# Patient Record
Sex: Female | Born: 1995 | Race: White | Hispanic: No | Marital: Married
Health system: Midwestern US, Community
[De-identification: ages and names within clinical notes are randomized; demographics above are authoritative.]

## PROBLEM LIST (undated history)

## (undated) DIAGNOSIS — D649 Anemia, unspecified: Secondary | ICD-10-CM

## (undated) DIAGNOSIS — M3500 Sicca syndrome, unspecified: Secondary | ICD-10-CM

## (undated) DIAGNOSIS — G932 Benign intracranial hypertension: Secondary | ICD-10-CM

## (undated) DIAGNOSIS — O24419 Gestational diabetes mellitus in pregnancy, unspecified control: Secondary | ICD-10-CM

## (undated) DIAGNOSIS — Z9889 Other specified postprocedural states: Secondary | ICD-10-CM

## (undated) DIAGNOSIS — I10 Essential (primary) hypertension: Secondary | ICD-10-CM

## (undated) DIAGNOSIS — R112 Nausea with vomiting, unspecified: Secondary | ICD-10-CM

## (undated) DIAGNOSIS — M1990 Unspecified osteoarthritis, unspecified site: Secondary | ICD-10-CM

## (undated) HISTORY — DX: Benign intracranial hypertension: G93.2

## (undated) HISTORY — DX: Unspecified osteoarthritis, unspecified site: M19.90

## (undated) HISTORY — DX: Essential (primary) hypertension: I10

## (undated) HISTORY — DX: Sjogren syndrome, unspecified: M35.00

## (undated) HISTORY — PX: NO PAST SURGERIES: SHX2092

---

## 2014-05-07 NOTE — ED Notes (Signed)
Pt FHR  150's

## 2014-05-07 NOTE — ED Notes (Signed)
Pt is approximately six months pregnant and had positive ultrasound at 10 weeks. Pt has received no prenatal care at this time. Pain started around noon. Pt denies any vaginal bleeding. Pt currently does not have an OBGYN

## 2014-05-08 LAB — BETA HCG, QT
Beta HCG, QT: 35717 m[IU]/mL — ABNORMAL HIGH (ref 1.0–6.0)
hCG Quant: 35717 m[IU]/mL — ABNORMAL HIGH (ref 1.0–6.0)

## 2014-05-08 LAB — URINALYSIS W/ RFLX MICROSCOPIC
Bilirubin: NEGATIVE
Blood: NEGATIVE
Glucose: NEGATIVE mg/dL
Ketone: NEGATIVE mg/dL
Nitrites: NEGATIVE
Protein: NEGATIVE mg/dL
Specific gravity: <1.005 (ref 1.003–1.030)
Urobilinogen: 0.2 EU/dL (ref 0.2–1.0)
pH (UA): 6.5 (ref 5.0–8.0)

## 2014-05-08 LAB — CBC WITH AUTOMATED DIFF
ABS. BASOPHILS: 0 10*3/uL (ref 0.0–0.06)
ABS. EOSINOPHILS: 0.1 10*3/uL (ref 0.0–0.4)
ABS. LYMPHOCYTES: 1.7 10*3/uL (ref 0.9–3.6)
ABS. MONOCYTES: 0.8 10*3/uL (ref 0.05–1.2)
ABS. NEUTROPHILS: 6.6 10*3/uL (ref 1.8–8.0)
BASOPHILS: 0 % (ref 0–2)
EOSINOPHILS: 1 % (ref 0–5)
HCT: 31.1 % — ABNORMAL LOW (ref 35.0–45.0)
HGB: 10.5 g/dL — ABNORMAL LOW (ref 12.0–16.0)
LYMPHOCYTES: 18 % — ABNORMAL LOW (ref 21–52)
MCH: 29.3 PG (ref 24.0–34.0)
MCHC: 33.8 g/dL (ref 31.0–37.0)
MCV: 86.9 FL (ref 74.0–97.0)
MONOCYTES: 9 % (ref 3–10)
MPV: 11 FL (ref 9.2–11.8)
NEUTROPHILS: 72 % (ref 40–73)
PLATELET: 215 10*3/uL (ref 135–420)
RBC: 3.58 M/uL — ABNORMAL LOW (ref 4.20–5.30)
RDW: 12.6 % (ref 11.6–14.5)
WBC: 9.2 10*3/uL (ref 4.6–13.2)

## 2014-05-08 LAB — POC URINE MACROSCOPIC
Bilirubin (POC): NEGATIVE
Blood (POC): NEGATIVE
Glucose, urine (POC): NEGATIVE mg/dL
Ketones (POC): NEGATIVE mg/dL
Nitrite (POC): NEGATIVE
Protein (POC): NEGATIVE mg/dL
Spec. gravity (POC): 1.01 (ref 1.003–1.030)
Urobilinogen (POC): 0.2 EU/dL (ref 0.2–1.0)
pH, urine  (POC): 7 (ref 5.0–8.0)

## 2014-05-08 LAB — URINE MICROSCOPIC ONLY
RBC: 0 /hpf (ref 0–5)
WBC: 11 /hpf (ref 0–4)

## 2014-05-08 LAB — METABOLIC PANEL, BASIC
Anion gap: 10 mmol/L (ref 3.0–18)
BUN/Creatinine ratio: 8 — ABNORMAL LOW (ref 12–20)
BUN: 4 MG/DL — ABNORMAL LOW (ref 7.0–18)
CO2: 24 mmol/L (ref 21–32)
Calcium: 8 MG/DL — ABNORMAL LOW (ref 8.5–10.1)
Chloride: 103 mmol/L (ref 100–108)
Creatinine: 0.5 MG/DL — ABNORMAL LOW (ref 0.6–1.3)
GFR est AA: >60 mL/min/{1.73_m2} (ref >60)
GFR est non-AA: >60 mL/min/{1.73_m2} (ref >60)
Glucose: 104 mg/dL — ABNORMAL HIGH (ref 74–99)
Potassium: 3.1 mmol/L — ABNORMAL LOW (ref 3.5–5.5)
Sodium: 137 mmol/L (ref 136–145)

## 2014-05-08 LAB — HCG QL SERUM: HCG, Ql.: POSITIVE — AB

## 2014-05-08 MED ORDER — POTASSIUM CHLORIDE 10 MEQ/100 ML IV PIGGY BACK
10 mEq/0 mL | INTRAVENOUS | Status: AC
Start: 2014-05-08 — End: 2014-05-08
  Administered 2014-05-08: 05:00:00 via INTRAVENOUS

## 2014-05-08 MED ADMIN — 0.9% sodium chloride infusion: INTRAVENOUS | @ 05:00:00 | NDC 00409798309

## 2014-05-08 MED FILL — POTASSIUM CHLORIDE 10 MEQ/100 ML IV PIGGY BACK: 10 mEq/0 mL | INTRAVENOUS | Qty: 100

## 2014-05-08 NOTE — Other (Signed)
TRANSFER - OUT REPORT:    Verbal report [facility] on Kelly DakinsLillian ANN-MARGARET Waters  being transferred to L&D(unit) for routine progression of care       Report consisted of patient???s Situation, Background, Assessment and   Recommendations(SBAR).     Information from the following report(s) SBAR, ED Summary and MAR was reviewed with the receiving nurse.    Lines:   Peripheral IV 05/07/14 Right Antecubital (Active)   Site Assessment Clean, dry, & intact 05/07/2014 11:50 PM   Phlebitis Assessment 0 05/07/2014 11:50 PM   Infiltration Assessment 0 05/07/2014 11:50 PM   Dressing Status Clean, dry, & intact 05/07/2014 11:50 PM   Dressing Type Tape;Transparent 05/07/2014 11:50 PM   Hub Color/Line Status Flushed;Patent 05/07/2014 11:50 PM   Action Taken Blood drawn 05/07/2014 11:50 PM        Opportunity for questions and clarification was provided.      Patient transported with:   Monitor

## 2014-05-08 NOTE — H&P (Signed)
History and Physical    High Risk Obstetrics Progress Note    Name: Kelly Waters MRN: [id number]  SSN: [id number]    Date of Birth: [birth year]  Age: 18 y.o.  Sex: female      Subjective:      LOS: 1 day    Estimated Date of Delivery: 08/28/14   Gestational Age Today: 7140w0d     Patient admitted for evaluation of lower abdominal pain.. States she does have mild abdominal pain, mild contractions and moderate nausea/vomiting.    Objective:     Vitals:  Blood pressure 124/57, pulse 90, temperature 99 ??F (37.2 ??C), resp. rate 18, height 5\' 6"  (1.676 m), weight 180 lb (81.647 kg), last menstrual period 11/21/2013, SpO2 100 %.   Temp (24hrs), Avg:98.9 ??F (37.2 ??C), Min:98.8 ??F (37.1 ??C), Max:99 ??F (37.2 ??C)    Systolic (24hrs), Avg:131 mmHg, Min:116 mmHg, Max:142 mmHg     Diastolic (24hrs), Avg:70 mmHg, Min:57 mmHg, Max:95 mmHg       Intake and Output:          Physical Exam:  Patient without distress.  Heart: Regular rate and rhythm, S1S2 present or without murmur or extra heart sounds  Back: costovertebral angle tenderness absent  Abdomen: soft, nontender  Fundus: soft and non tender  Perineum: blood absent, amniotic fluid absent  Cervical Exam: Closed/Thick/High       Membranes:  Intact    Uterine Activity:  Frequency: Irregular.    Fetal Heart Rate:  Baseline: 150 per minute        Labs:   Recent Results (from the past 36 hour(s))   URINALYSIS W/ RFLX MICROSCOPIC    Collection Time: 05/07/14 11:45 PM   Result Value Ref Range    Color YELLOW      Appearance CLEAR      Specific gravity <1.005 1.003 - 1.030    pH (UA) 6.5 5.0 - 8.0      Protein NEGATIVE  NEG mg/dL    Glucose NEGATIVE  NEG mg/dL    Ketone NEGATIVE  NEG mg/dL    Bilirubin NEGATIVE  NEG      Blood NEGATIVE  NEG      Urobilinogen 0.2 0.2 - 1.0 EU/dL    Nitrites NEGATIVE  NEG      Leukocyte Esterase SMALL (A) NEG     URINE MICROSCOPIC ONLY    Collection Time: 05/07/14 11:45 PM   Result Value Ref Range    WBC 11 to 20 0 - 4 /hpf     RBC 0 to 3 0 - 5 /hpf    Epithelial cells 3+ 0 - 5 /lpf    Bacteria 3+ (A) NEG /hpf   CBC WITH AUTOMATED DIFF    Collection Time: 05/08/14 12:10 AM   Result Value Ref Range    WBC 9.2 4.6 - 13.2 K/uL    RBC 3.58 (L) 4.20 - 5.30 M/uL    HGB 10.5 (L) 12.0 - 16.0 g/dL    HCT 78.231.1 (L) 95.635.0 - 45.0 %    MCV 86.9 74.0 - 97.0 FL    MCH 29.3 24.0 - 34.0 PG    MCHC 33.8 31.0 - 37.0 g/dL    RDW 21.312.6 08.611.6 - 57.814.5 %    PLATELET 215 135 - 420 K/uL    MPV 11.0 9.2 - 11.8 FL    NEUTROPHILS 72 40 - 73 %    LYMPHOCYTES 18 (L) 21 - 52 %    MONOCYTES 9  3 - 10 %    EOSINOPHILS 1 0 - 5 %    BASOPHILS 0 0 - 2 %    ABS. NEUTROPHILS 6.6 1.8 - 8.0 K/UL    ABS. LYMPHOCYTES 1.7 0.9 - 3.6 K/UL    ABS. MONOCYTES 0.8 0.05 - 1.2 K/UL    ABS. EOSINOPHILS 0.1 0.0 - 0.4 K/UL    ABS. BASOPHILS 0.0 0.0 - 0.06 K/UL    DF AUTOMATED     METABOLIC PANEL, BASIC    Collection Time: 05/08/14 12:10 AM   Result Value Ref Range    Sodium 137 136 - 145 mmol/L    Potassium 3.1 (L) 3.5 - 5.5 mmol/L    Chloride 103 100 - 108 mmol/L    CO2 24 21 - 32 mmol/L    Anion gap 10 3.0 - 18 mmol/L    Glucose 104 (H) 74 - 99 mg/dL    BUN 4 (L) 7.0 - 18 MG/DL    Creatinine 1.09 (L) 0.6 - 1.3 MG/DL    BUN/Creatinine ratio 8 (L) 12 - 20      GFR est AA >60 >60 ml/min/1.36m2    GFR est non-AA >60 >60 ml/min/1.61m2    Calcium 8.0 (L) 8.5 - 10.1 MG/DL   HCG QL SERUM    Collection Time: 05/08/14 12:10 AM   Result Value Ref Range    HCG, Ql. POSITIVE (A) NEG     TOTAL HCG, QT.    Collection Time: 05/08/14 12:10 AM   Result Value Ref Range    HCG, [place]. [address] (H) 1.0 - 6.0 MIU/ML   POC URINE MACROSCOPIC    Collection Time: 05/08/14  2:19 AM   Result Value Ref Range    Color YELLOW      Appearance CLEAR      Spec. gravity (POC) 1.010 1.003 - 1.030      pH, urine  (POC) 7.0 5.0 - 8.0      Protein (POC) NEGATIVE  NEG mg/dL    Glucose, urine (POC) NEGATIVE  NEG mg/dL    Ketones (POC) NEGATIVE  NEG mg/dL    Bilirubin (POC) NEGATIVE  NEG      Blood (POC) NEGATIVE  NEG       Urobilinogen (POC) 0.2 0.2 - 1.0 EU/dL    Nitrite (POC) NEGATIVE  NEG      Leukocyte esterase (POC) SMALL (A) NEG         Assessment and Plan:      Active Problems:    * No active hospital problems. *     [redacted] weeks gestationa with UTI.   Plan--hydration and antibiotics treatment.    Signed By: Basilia Jumbo, MD     May 08, 2014

## 2014-05-08 NOTE — ED Notes (Signed)
[facility]

## 2014-05-08 NOTE — ED Provider Notes (Signed)
HPI Comments: 12:03 AM. 05/07/2014    Kelly Waters is a 18 y.o. female who presents to the ED with complaints of abdominal pain every half hour since noon.  She notes that she has not felt her baby moving much today, and that baby is usually very active.  Pt is approximately six months pregnant and had positive ultrasound at 10 weeks. Pt has received no prenatal care at this time. Pain started around noon. Pt denies any vaginal bleeding. Pt currently does not have an OBGYN.  No other symptoms expressed.          The history is provided by the patient.        History reviewed. No pertinent past medical history.     History reviewed. No pertinent past surgical history.      History reviewed. No pertinent family history.     History     Social History   ??? Marital Status: MARRIED     Spouse Name: N/A     Number of Children: N/A   ??? Years of Education: N/A     Occupational History   ??? Not on file.     Social History Main Topics   ??? Smoking status: Not on file   ??? Smokeless tobacco: Not on file   ??? Alcohol Use: Not on file   ??? Drug Use: Not on file   ??? Sexual Activity: Not on file     Other Topics Concern   ??? Not on file     Social History Narrative   ??? No narrative on file                  ALLERGIES: Review of patient's allergies indicates no known allergies.      Review of Systems   Constitutional: Negative.    HENT: Negative.    Eyes: Negative.    Respiratory: Negative.    Cardiovascular: Negative.    Gastrointestinal: Positive for abdominal pain.   Endocrine: Negative.    Genitourinary: Negative.    Musculoskeletal: Negative.    Skin: Negative.    Allergic/Immunologic: Negative.    Neurological: Negative.    Hematological: Negative.    Psychiatric/Behavioral: Negative.    All other systems reviewed and are negative.      Filed Vitals:    05/07/14 2336 05/07/14 2355 05/08/14 0005 05/08/14 0030   BP: 142/95 139/76 137/65 126/62   Pulse: 101      Temp: 98.8 ??F (37.1 ??C)      Resp: 18       Height: 5\' 6"  (1.676 m)      Weight: 81.647 kg (180 lb)      SpO2: 100%  95% 96%            Physical Exam   Constitutional: She is oriented to person, place, and time. She appears well-developed and well-nourished. No distress.   HENT:   Head: Normocephalic.   Right Ear: External ear normal.   Left Ear: External ear normal.   Mouth/Throat: No oropharyngeal exudate.   Eyes: Conjunctivae and EOM are normal. Pupils are equal, round, and reactive to light. Right eye exhibits no discharge. Left eye exhibits no discharge. No scleral icterus.   Neck: Normal range of motion. Neck supple. No JVD present. No tracheal deviation present. No thyromegaly present.   Cardiovascular: Normal rate, regular rhythm, normal heart sounds and intact distal pulses.  Exam reveals no gallop and no friction rub.    No murmur heard.  Pulmonary/Chest: Effort normal and breath sounds normal. No stridor. No respiratory distress. She has no wheezes. She has no rales. She exhibits no tenderness.   Abdominal: Soft. Bowel sounds are normal. She exhibits no distension and no mass. There is no tenderness. There is no rebound and no guarding.   Genitourinary:   6 months gestational sized uterus   Musculoskeletal: Normal range of motion. She exhibits no edema or tenderness.   Lymphadenopathy:     She has no cervical adenopathy.   Neurological: She is alert and oriented to person, place, and time. She displays normal reflexes. No cranial nerve deficit. She exhibits normal muscle tone. Coordination normal.   Skin: Skin is warm and dry. No rash noted. She is not diaphoretic. No erythema. No pallor.   Nursing note and vitals reviewed.       MDM    Procedures     12:08 AM, 05/07/2014   Consult:  Discussed care with Dr. Lorenso Quarryredeine Standard discussion; including history of patient???s chief complaint, available diagnostic results, and treatment course. Agreed to send to L&D    Scribe Attestation:    May 08, 2014 at 12:02 AM F. Layla MawKale Mathias scribing for and in the presence of Dr.Jacek Colson B Manson PasseyBrown, MD     Michael BostonF. Kale Mathias, Scribe      Provider Attestation:   I personally performed the services described in the documentation, reviewed the documentation, as recorded by the scribe in my presence, and it accurately and completely records my words and actions.  Iantha Fallen- Sayed Apostol B. Manson PasseyBrown, MD

## 2014-05-08 NOTE — Other (Addendum)
0200: Rcd G1P0 estimated gestational age of [redacted] week pt to room 2221 from [facility] ED with complaints of sharp left sided abd pain. Pt reports pain comes and goes at random. Pt in no visible distress. Denies vag bleeding, discharge or LOF. Reports decreased fetal movement today after pain started. States she has only felt the baby move a few times today. Pt reports some fetal [facility]. EFM and TOCO applied, abd soft to palpation. Pt had ultrasound at pregnancy center in [place] and was estimated to be 7077w6d on June 28th. Last menstrual period was April 15th. Pt has had no other prenatal care, but wishes to begin prenatal care. Pt also complains of chest pain and tightening, pt has history of panic attacks.     0215: Clarified chest pain with pt. Pt states it does not seem like pain from anxiety to her. Vital signs stable.    0220: Dr Johny Chessredein called and notified of pts arrival, complaints, history, no contractions, FHR, UA results, and current medications from [facility] ED. Pt to be evaluated in ER for chest pain. May come back to L&D afterwards for dating ultrasound. ER called for bed.     0225: Pt updated on POC. Pt does not want to go to ER. States her chest pain in better and felt like heartburn and anxiety. Pt also refusing posassium IV. VSS, O2 sats 100%    0232: Dr Johny Chessredein called and notified of pts refusal to go to ER and statement that she believes it was pain from heartburn and anxiety. Pt reports pain is "better." Also notified of Potassium refusal. Pt to have ultrasound and may be discharged home.     0250: Explained POC to pt. Pt laughing and talking with husband. Pt states last time she felt abd pain was in the ambulance ride, and has not felt any since.     [id number]: Ultrasound at bedside.     [id number]: Spoke with Dr Johny Chessredein regarding ultrasound results, FHR, and pt well being. Pt to be discharged.     0430: Verbal and written discharge instructions given to pt. Including  information [place].  Pt verbalizes understanding.     [id number]: Pt ambulated off unit with spouse in good condition. Discharge instructions in hand.

## 2014-05-21 LAB — HIV-1 AB, EXTERNAL
HIV, EXTERNAL: NEGATIVE
HIV, External: NEGATIVE

## 2014-05-21 LAB — RPR, EXTERNAL: RPR, External: NONREACTIVE

## 2014-05-21 LAB — HEPATITIS B SURFACE AG, EXTERNAL: HBsAg, External: NEGATIVE

## 2014-05-21 LAB — RUBELLA AB, IGG, EXTERNAL: Rubella, External: IMMUNE

## 2014-06-30 ENCOUNTER — Ambulatory Visit
Admit: 2014-06-30 | Discharge: 2014-06-30 | Disposition: A | Discharge status: Home / Self Care | Payer: MEDICAID | Attending: Specialist | Admitting: Specialist

## 2014-06-30 DIAGNOSIS — O26893 Other specified pregnancy related conditions, third trimester: Secondary | ICD-10-CM

## 2014-06-30 LAB — CBC W/O DIFF
HCT: 28.9 % — ABNORMAL LOW (ref 35.0–45.0)
HGB: 9.5 g/dL — ABNORMAL LOW (ref 12.0–16.0)
MCH: 28.2 PG (ref 24.0–34.0)
MCHC: 32.9 g/dL (ref 31.0–37.0)
MCV: 85.8 FL (ref 74.0–97.0)
MPV: 11.5 FL (ref 9.2–11.8)
PLATELET: 210 10*3/uL (ref 135–420)
RBC: 3.37 M/uL — ABNORMAL LOW (ref 4.20–5.30)
RDW: 12.4 % (ref 11.6–14.5)
WBC: 7 10*3/uL (ref 4.6–13.2)

## 2014-06-30 LAB — URIC ACID: Uric acid: 2.5 MG/DL — ABNORMAL LOW (ref 2.6–7.2)

## 2014-06-30 LAB — METABOLIC PANEL, COMPREHENSIVE
A-G Ratio: 0.8 (ref 0.8–1.7)
ALT (SGPT): 22 U/L (ref 13–56)
AST (SGOT): 19 U/L (ref 15–37)
Albumin: 2.5 g/dL — ABNORMAL LOW (ref 3.4–5.0)
Alk. phosphatase: 159 U/L — ABNORMAL HIGH (ref 45–117)
Anion gap: 9 mmol/L (ref 3.0–18)
BUN/Creatinine ratio: 9 — ABNORMAL LOW (ref 12–20)
BUN: 5 MG/DL — ABNORMAL LOW (ref 7.0–18)
Bilirubin, total: 0.4 MG/DL (ref 0.2–1.0)
CO2: 25 mmol/L (ref 21–32)
Calcium: 8.7 MG/DL (ref 8.5–10.1)
Chloride: 107 mmol/L (ref 100–108)
Creatinine: 0.55 MG/DL — ABNORMAL LOW (ref 0.6–1.3)
GFR est AA: >60 mL/min/{1.73_m2} (ref >60)
GFR est non-AA: >60 mL/min/{1.73_m2} (ref >60)
Globulin: 3.2 g/dL (ref 2.0–4.0)
Glucose: 88 mg/dL (ref 74–99)
Potassium: 3.6 mmol/L (ref 3.5–5.5)
Protein, total: 5.7 g/dL — ABNORMAL LOW (ref 6.4–8.2)
Sodium: 141 mmol/L (ref 136–145)

## 2014-06-30 LAB — POC URINE MACROSCOPIC
Bilirubin (POC): NEGATIVE
Blood (POC): NEGATIVE
Glucose, urine (POC): NEGATIVE mg/dL
Ketones (POC): NEGATIVE mg/dL
Nitrite (POC): NEGATIVE
Protein (POC): NEGATIVE mg/dL
Spec. gravity (POC): 1.015 (ref 1.003–1.030)
Urobilinogen (POC): 0.2 EU/dL (ref 0.2–1.0)
pH, urine  (POC): 7.5 (ref 5.0–8.0)

## 2014-06-30 NOTE — Progress Notes (Addendum)
G1 P0 presents via emergency room with severe lower abdominal pain @ 31+4 reports positive fetal movement noted per  Per patient and nurse. Abdomen palpates semi-soft patient denies bleeding or leaking of fluids. TOCO and EFM monitor applied. Plan of care discussed with patient patient agrees with plan of care    1325 Dr Althea Grimmerosado informed of patient and complaints [facility]    1500 Pt. Given verbal discharge instructions and encouraged to see op-tha,ologist for vision problems

## 2014-06-30 NOTE — H&P (Signed)
History & Physical    Name: Kelly EvertsLillian Ann-Meredith Waters MRN: [id number]  SSN: [id number]    Date of Birth: 10/03/1995  Age: 18 y.o.  Sex: female        Subjective:     Estimated Date of Delivery: 08/28/14  OB History     Gravida Para Term Preterm AB TAB SAB Ectopic Multiple Living    1                   Kelly Waters presents for evaluation of pregnancy at 5018w4d for double vision. Prenatal course was normal. Please see prenatal records for details. Pt denies any headaches, nausea or vomiting    Past Medical History   Diagnosis Date   ??? Psychiatric problem      Panic attacks   ??? H/O birth trauma      No past surgical history on file.  Social History     Occupational History   ??? Not on file.     Social History Main Topics   ??? Smoking status: Never Smoker    ??? Smokeless tobacco: Not on file   ??? Alcohol Use: No   ??? Drug Use: No   ??? Sexual Activity: Not on file     No family history on file.    Allergies   Allergen Reactions   ??? Latex, Natural Rubber Itching   ??? Blueberry Anaphylaxis   ??? Kiwi Anaphylaxis     Prior to Admission medications    Medication Sig Start Date End Date Taking? Authorizing Provider   pnv w/o calcium-iron fum-fa 27-1 mg tab Take 1 Tab by mouth. Indications: PREGNANCY   Yes Historical Provider        Review of Systems: Pertinent items are noted in HPI.    Objective:     Vitals:  Filed Vitals:    06/30/14 1315 06/30/14 1330 06/30/14 1351 06/30/14 1400   BP: 122/68 141/65 125/61 133/92   Pulse: 89 76 88 88   Temp:       Resp: 18 18 18 18    Height:       Weight:            Physical Exam:  Abdomen: soft, nontender  Membranes:  Intact  Fetal Heart Rate: Reactive, category 1    Prenatal Labs:   No results found for: RUBELLAEXT, GRBSEXT, HBSAGEXT, HIVEXT, RPREXT, GONNOEXT, CHLAMEXT      Assessment/Plan:     Plan: 10318w4d   PIH panel within normal limits, no evidence of preeclampsia   Reassuring fetal status   D/C home   F/u 4 days with primary OB    Signed By:  Etta GrandchildIdalia V Rosado-Torres, MD     June 30, 2014

## 2014-07-01 LAB — LD: LD: 179 U/L (ref 81–234)

## 2014-07-30 LAB — GYN RAPID GP B STREP, EXTERNAL: GrBStrep, External: NEGATIVE

## 2014-08-15 ENCOUNTER — Inpatient Hospital Stay
Admit: 2014-08-15 | Discharge: 2014-08-18 | Disposition: A | Discharge status: Home / Self Care | Payer: MEDICAID | Attending: Obstetrics & Gynecology | Admitting: Obstetrics & Gynecology

## 2014-08-15 DIAGNOSIS — O133 Gestational [pregnancy-induced] hypertension without significant proteinuria, third trimester: Secondary | ICD-10-CM

## 2014-08-15 LAB — CBC W/O DIFF
HCT: 28.2 % — ABNORMAL LOW (ref 35.0–45.0)
HGB: 9.2 g/dL — ABNORMAL LOW (ref 12.0–16.0)
MCH: 26.8 PG (ref 24.0–34.0)
MCHC: 32.6 g/dL (ref 31.0–37.0)
MCV: 82.2 FL (ref 74.0–97.0)
MPV: 12 FL — ABNORMAL HIGH (ref 9.2–11.8)
PLATELET: 187 10*3/uL (ref 135–420)
RBC: 3.43 M/uL — ABNORMAL LOW (ref 4.20–5.30)
RDW: 14.1 % (ref 11.6–14.5)
WBC: 6 10*3/uL (ref 4.6–13.2)

## 2014-08-15 LAB — POC URINE MACROSCOPIC
Bilirubin (POC): NEGATIVE
Blood (POC): NEGATIVE
Glucose, urine (POC): NEGATIVE mg/dL
Ketones (POC): NEGATIVE mg/dL
Nitrite (POC): NEGATIVE
Protein (POC): 100 mg/dL — AB
Spec. gravity (POC): 1.015 (ref 1.003–1.030)
Urobilinogen (POC): 1 EU/dL (ref 0.2–1.0)
pH, urine  (POC): 7 (ref 5.0–8.0)

## 2014-08-15 LAB — METABOLIC PANEL, COMPREHENSIVE
A-G Ratio: 0.7 — ABNORMAL LOW (ref 0.8–1.7)
ALT (SGPT): 12 U/L — ABNORMAL LOW (ref 13–56)
AST (SGOT): 15 U/L (ref 15–37)
Albumin: 2.3 g/dL — ABNORMAL LOW (ref 3.4–5.0)
Alk. phosphatase: 255 U/L — ABNORMAL HIGH (ref 45–117)
Anion gap: 8 mmol/L (ref 3.0–18)
BUN/Creatinine ratio: 9 — ABNORMAL LOW (ref 12–20)
BUN: 7 MG/DL (ref 7.0–18)
Bilirubin, total: 0.5 MG/DL (ref 0.2–1.0)
CO2: 26 mmol/L (ref 21–32)
Calcium: 7.9 MG/DL — ABNORMAL LOW (ref 8.5–10.1)
Chloride: 107 mmol/L (ref 100–108)
Creatinine: 0.74 MG/DL (ref 0.6–1.3)
GFR est AA: >60 mL/min/{1.73_m2} (ref >60)
GFR est non-AA: >60 mL/min/{1.73_m2} (ref >60)
Globulin: 3.5 g/dL (ref 2.0–4.0)
Glucose: 85 mg/dL (ref 74–99)
Potassium: 3.3 mmol/L — ABNORMAL LOW (ref 3.5–5.5)
Protein, total: 5.8 g/dL — ABNORMAL LOW (ref 6.4–8.2)
Sodium: 141 mmol/L (ref 136–145)

## 2014-08-15 LAB — URIC ACID: Uric acid: 5.1 MG/DL (ref 2.6–7.2)

## 2014-08-15 MED ORDER — ACETAMINOPHEN 500 MG TAB
500 mg | Freq: Once | ORAL | Status: AC
Start: 2014-08-15 — End: 2014-08-15

## 2014-08-15 MED ORDER — ACETAMINOPHEN 500 MG TAB
500 mg | ORAL | Status: AC
Start: 2014-08-15 — End: 2014-08-15
  Administered 2014-08-16: via ORAL

## 2014-08-15 MED ORDER — LACTATED RINGERS IV
Freq: Once | INTRAVENOUS | Status: AC
Start: 2014-08-15 — End: 2014-08-15
  Administered 2014-08-15: 22:00:00 via INTRAVENOUS

## 2014-08-15 MED FILL — TYLENOL EXTRA STRENGTH 500 MG TABLET: 500 mg | ORAL | Qty: 2

## 2014-08-15 MED FILL — LACTATED RINGERS IV: INTRAVENOUS | Qty: 1000

## 2014-08-15 NOTE — Progress Notes (Signed)
Pt positioned for epidural. Dr. Marcheta GrammesMa at bedside for epidural. Procedure for consent explained and consent signed.    2020 Timeout performed.    2030 Test dose given. Pt tolerated well.

## 2014-08-15 NOTE — Progress Notes (Signed)
Dr. Mariah MillingMorales in to see pt. SVE and AROM at this time.

## 2014-08-15 NOTE — Progress Notes (Signed)
Helyn AppH. Shields, CRNA at bedside discussing epidural with pt.  Pt comfortable.

## 2014-08-15 NOTE — Anesthesia Procedure Notes (Addendum)
Labor Epidural Placement    Start time: 08/15/2014 10:15 PM  End time: 08/15/2014 10:40 PM  Reason for block: at surgeon's request and labor epidural  Staffing  Anesthesiologist: Jasmia Angst, JU-FANG  Performed by: anesthesiologist   Prep  Risks and benefits discussed with the patient and plans are to proceed   Site marked, Timeout performed, 22:15  Patient was placed in seated position  The back was prepped at the lumbar region  Prep Solution(s): Betadine and chlorhexidine  Epidural  Epidural Location: L3-4  Needle: 18G Tuohy  Attempts: needle passed 2 time(s) with loss of resistance using air  Catheter: 20 G epidural catheter placed/secured  Catheter at skin depth: approximately 12cm  Catheter into epidural space: approximately 7cm  No blood with aspiration, no cerebrospinal fluid with aspiration, no paresthesia and negative aspiration test  Test dose: lidocaine 1.5% w/ epi, negative  Assessment  Catheter was secured to back with tegaderm  Insertion was uncomplicated and patient tolerated without any apparent complications  Additional Notes  Bolus dose 10 ml 0.2% ropivacaine in divided doses after negative aspiration given @22:35. Patient comfortable @22:40. Vital signs stable.

## 2014-08-15 NOTE — Progress Notes (Signed)
Labor Progress Note  Patient seen, fetal heart rate and contraction pattern evaluated, patient examined. Notes that she woke up with a mild HA 2 days ago, similar to HAs she had in the past. Did not go away but progressively got worse. Improved until last night when it became much worse, became nauseated, vomited, saw stars, was phonophobia and photophobia. Fell asleep. Woke up this am with "tingling" in her head, took a nap, woke up still with a HA so she came to the hospital. Tried tylenol which was not helpful. Has h/o migraines but this HA is different because it is over her eye rather than her temporal region. Also seeing colored spots and lines. SOB off/on, no RUQ pain. Also has CP that started several weeks ago off/on.    Patient Vitals for the past 8 hrs:   BP Temp Pulse Resp Height Weight   08/15/14 2000 130/68 mmHg - 76 - - -   08/15/14 1940 142/88 mmHg - 80 - - -   08/15/14 1920 (!) 142/91 mmHg 98.1 ??F (36.7 ??C) 91 16 - -   08/15/14 1900 141/86 mmHg - 88 - - -   08/15/14 1840 141/78 mmHg - 71 - - -   08/15/14 1821 152/71 mmHg - 69 - - -   08/15/14 1800 124/62 mmHg - 69 - - -   08/15/14 1740 133/80 mmHg - 70 - - -   08/15/14 1720 144/84 mmHg - 72 - - -   08/15/14 1706 134/73 mmHg - 82 - - -   08/15/14 1701 135/80 mmHg - 71 - - -   08/15/14 1618 - - - - '5\' 5"'  (1.651 m) 97.07 kg (214 lb)   08/15/14 1615 144/82 mmHg 97.6 ??F (36.4 ??C) 88 20 - -       Physical Exam:  Gen: NAD  CV: RRR  Lungs: CTA B  Abd:  +BS, soft, NT in RUQ or anywhere  Ext:  No swelling or erythema  Cervical Exam:  2 cm dilated    80% effaced    -2 station    Presenting Part: cephalic  Cervical Position: mid position  Membranes:  Artificial Rupture of Membranes; Amniotic Fluid: medium amount of clear fluid  Uterine Activity: Frequency: Every 2-8 minutes  Fetal Heart Rate: Baseline: 145 per minute  Variability: moderate  Accelerations: yes  Decelerations: decel to the 80s x 9 minutes with return to baseline and  reactive afterward, good variability throughout the decel    Recent Results (from the past 24 hour(s))   POC URINE MACROSCOPIC    Collection Time: 08/15/14  4:16 PM   Result Value Ref Range    Color YELLOW      Appearance CLEAR      Spec. gravity (POC) 1.015 1.003 - 1.030      pH, urine  (POC) 7.0 5.0 - 8.0      Protein (POC) 100 (A) NEG mg/dL    Glucose, urine (POC) NEGATIVE  NEG mg/dL    Ketones (POC) NEGATIVE  NEG mg/dL    Bilirubin (POC) NEGATIVE  NEG      Blood (POC) NEGATIVE  NEG      Urobilinogen (POC) 1.0 0.2 - 1.0 EU/dL    Nitrite (POC) NEGATIVE  NEG      Leukocyte esterase (POC) SMALL (A) NEG     CBC W/O DIFF    Collection Time: 08/15/14  5:10 PM   Result Value Ref Range    WBC 6.0 4.6 -  13.2 K/uL    RBC 3.43 (L) 4.20 - 5.30 M/uL    HGB 9.2 (L) 12.0 - 16.0 g/dL    HCT 28.2 (L) 35.0 - 45.0 %    MCV 82.2 74.0 - 97.0 FL    MCH 26.8 24.0 - 34.0 PG    MCHC 32.6 31.0 - 37.0 g/dL    RDW 14.1 11.6 - 14.5 %    PLATELET 187 135 - 420 K/uL    MPV 12.0 (H) 9.2 - 44.0 FL   METABOLIC PANEL, COMPREHENSIVE    Collection Time: 08/15/14  5:10 PM   Result Value Ref Range    Sodium 141 136 - 145 mmol/L    Potassium 3.3 (L) 3.5 - 5.5 mmol/L    Chloride 107 100 - 108 mmol/L    CO2 26 21 - 32 mmol/L    Anion gap 8 3.0 - 18 mmol/L    Glucose 85 74 - 99 mg/dL    BUN 7 7.0 - 18 MG/DL    Creatinine 0.74 0.6 - 1.3 MG/DL    BUN/Creatinine ratio 9 (L) 12 - 20      GFR est AA >60 >60 ml/min/1.4m    GFR est non-AA >60 >60 ml/min/1.749m   Calcium 7.9 (L) 8.5 - 10.1 MG/DL    Bilirubin, total 0.5 0.2 - 1.0 MG/DL    ALT 12 (L) 13 - 56 U/L    AST 15 15 - 37 U/L    Alk. phosphatase 255 (H) 45 - 117 U/L    Protein, total 5.8 (L) 6.4 - 8.2 g/dL    Albumin 2.3 (L) 3.4 - 5.0 g/dL    Globulin 3.5 2.0 - 4.0 g/dL    A-G Ratio 0.7 (L) 0.8 - 1.7     URIC ACID    Collection Time: 08/15/14  5:10 PM   Result Value Ref Range    Uric acid 5.1 2.6 - 7.2 MG/DL     Assessment/Plan:  1. Suspected Preeclampsia--> HA resolved at this time, got tylenol, does  have proteinuria and mildly elevated BPs, if HA returns or BPs increase, plan on starting magnesium for Sz prophylaxis, labs great, recheck in the am  2. Decel--> Baby beautifully reactive now, will watch, begin IOL.  3. Prenatals reviewed, GBS neg    Signed By:  ReDagoberto ReefMD     August 15, 2014 8:33 PM

## 2014-08-15 NOTE — Progress Notes (Addendum)
[id number]- Patient arrived from the emergency room G1P0 38weeks 1day with complaints of a headache x2 days, and nauseated all day, patient states she vomited once this morning.  States she is a patient of the health department, but will be transferring to [facility] due to her in ability to feel her baby move, she states she was informed that the placenta is "in front of the baby" which does not allow her to feel the baby.  States she was last seen on 08/08/2014 for an NST and had a cervical exam 1.5cm    1640- spoke with Dr. Mariah MillingMorales advised of patient complaint of headache and nausea, vital signs given and SVE, orders given    1703- spoke with Dr. Mariah MillingMorales, orders given    1705- Lab at bedside     1817- Patient up to restroom     1850- Called Dr. Horton ChinMorales adv of [facility]VE and [facility], orders given    1900- Patient sitting up, monitors adjusted

## 2014-08-15 NOTE — H&P (Signed)
History & Physical    Name: Kelly Waters MRN: [id number]  SSN: [id number]    Date of Birth: November 23, 1995  Age: 19 y.o.  Sex: female        Subjective:     Estimated Date of Delivery: 08/28/14  OB History     Gravida Para Term Preterm AB TAB SAB Ectopic Multiple Living    1                   Ms. Whitacre presents for evaluation of pregnancy at [redacted]w[redacted]d for HA x 2 days. Vomited this am but ate afterward. Was nauseous but did not vomit. No SOB, visual changes or RUQ pain. Prenatal course was followed by [facility]. Pt states that she was planning to transfer to [facility]. Prenatal records unavailable.    Past Medical History   Diagnosis Date   ??? Psychiatric problem      Panic attacks   ??? H/O birth trauma--> notes umbilical cord around her neck      History reviewed. No pertinent past surgical history.  Allergies   Allergen Reactions   ??? Latex, Natural Rubber Itching   ??? Blueberry Anaphylaxis   ??? Kiwi Anaphylaxis     Prior to Admission medications    Medication Sig Start Date End Date Taking? Authorizing Provider   ferrous sulfate 325 mg (65 mg iron) tablet Take 325 mg by mouth daily.   Yes Historical Provider   pnv w/o calcium-iron fum-fa 27-1 mg tab Take 1 Tab by mouth. Indications: PREGNANCY   Yes Historical Provider      Social History     Occupational History   ??? Not on file.     Social History Main Topics   ??? Smoking status: Never Smoker    ??? Smokeless tobacco: Not on file   ??? Alcohol Use: No   ??? Drug Use: No   ??? Sexual Activity: Not on file     History reviewed. No pertinent family history.    Review of Systems: Pertinent items are noted in HPI.    Objective:     Vitals:  Filed Vitals:    08/15/14 1615 08/15/14 1618   BP: 144/82    Pulse: 88    Temp: 97.6 ??F (36.4 ??C)    Resp: 20    Height:   (1.651 m)   Weight:  214 lb (97.07 kg)        Physical Exam:  Cervix 2 cm per nurse  Fetal Heart Rate: Baseline: 150 per minute  Variability: moderate  Accelerations: yes   Uterine contractions: irregular, every 5-10 minutes    Prenatal Labs:   No results found for: RUBELLAEXT, GRBSEXT, HBSAGEXT, HIVEXT, RPREXT, GONNOEXT, CHLAMEXT       Recent Results (from the past 24 hour(s))   POC URINE MACROSCOPIC    Collection Time: 08/15/14  4:16 PM   Result Value Ref Range    Color YELLOW      Appearance CLEAR      Spec. gravity (POC) 1.015 1.003 - 1.030      pH, urine  (POC) 7.0 5.0 - 8.0      Protein (POC) 100 (A) NEG mg/dL    Glucose, urine (POC) NEGATIVE  NEG mg/dL    Ketones (POC) NEGATIVE  NEG mg/dL    Bilirubin (POC) NEGATIVE  NEG      Blood (POC) NEGATIVE  NEG      Urobilinogen (POC) 1.0 0.2 - 1.0 EU/dL  Nitrite (POC) NEGATIVE  NEG      Leukocyte esterase (POC) SMALL (A) NEG         Assessment/Plan:     Plan: [id number]   Will do Preeclampsia labs and serial BPs   Recheck cervix   Reassess in 2-3 hours, if labs abnormal or BPs stay elevated, plan IOL.   If symptoms resolved, D/C home with follow up with primary OB tomorrow.    Signed By:  Etta Grandchildenee C Taleeya Blondin, MD     August 15, 2014 4:41 PM

## 2014-08-15 NOTE — Anesthesia Pre-Procedure Evaluation (Signed)
Anesthetic History               Review of Systems / Medical History  Patient summary reviewed, nursing notes reviewed and pertinent labs reviewed    Pulmonary                   Neuro/Psych              Cardiovascular                       GI/Hepatic/Renal                Endo/Other             Other Findings              Physical Exam    Airway  Mallampati: II  TM Distance: > 6 cm  Neck ROM: normal range of motion   Mouth opening: Normal     Cardiovascular    Rhythm: regular  Rate: normal         Dental  No notable dental hx       Pulmonary  Breath sounds clear to auscultation               Abdominal  GI exam deferred       Other Findings            Anesthetic Plan    ASA: 2  Anesthesia type: epidural          Induction: Intravenous  Anesthetic plan and risks discussed with: Patient

## 2014-08-15 NOTE — Progress Notes (Addendum)
Bedside and Verbal shift change report given to S.Amelie Caracci, RN and E. Plemmons, RN by L. Ivin Bootyrews, Charity fundraiserN. Report included the following information SBAR, Kardex, MAR and Recent Results.     1920 Pt sitting up in bed resting quietly.  Compliant of continued headache pain. Pt also states that she has intermittent chest pain for several days. States that pain usually comes at night and comes in waves. Dr. Mariah MillingMorales contacted. New orders given.     1947 Milk of Magnesia given    1957 Pt repositioned to left side.    1958 Pt repositioned to right side. O2 applied, LR bolus given.    2002 repositioned to LT side    2003 E. Plemmons, RN in for SVE. Tolerated well. Pt cervix remains 2cm.

## 2014-08-15 NOTE — Anesthesia Procedure Notes (Signed)
Labor Epidural Placement    Start time: 08/15/2014 10:15 PM  End time: 08/15/2014 10:40 PM  Reason for block: at surgeon's request and labor epidural  Staffing  Anesthesiologist: MA, JU-FANG  Performed by: anesthesiologist   Prep  Risks and benefits discussed with the patient and plans are to proceed   Site marked, Timeout performed, 22:15  Patient was placed in seated position  The back was prepped at the lumbar region  Prep Solution(s): Betadine and chlorhexidine  Epidural  Epidural Location: L3-4  Needle: 18G Tuohy  Attempts: needle passed 2 time(s) with loss of resistance using air  Catheter: 20 G epidural catheter placed/secured  Catheter at skin depth: approximately 12cm  Catheter into epidural space: approximately 7cm  No blood with aspiration, no cerebrospinal fluid with aspiration, no paresthesia and negative aspiration test  Test dose: lidocaine 1.5% w/ epi, negative  Assessment  Catheter was secured to back with tegaderm  Insertion was uncomplicated and patient tolerated without any apparent complications  Additional Notes  Bolus dose 10 ml 0.2% ropivacaine in divided doses after negative aspiration given @22 :35. Patient comfortable @22 :40. Vital signs stable.

## 2014-08-16 DIAGNOSIS — O139 Gestational [pregnancy-induced] hypertension without significant proteinuria, unspecified trimester: Secondary | ICD-10-CM

## 2014-08-16 LAB — TYPE AND SCREEN
ABO/Rh: B POS
Antibody Screen: NEGATIVE

## 2014-08-16 LAB — CBC WITH AUTOMATED DIFF
ABS. BASOPHILS: 0 10*3/uL (ref 0.0–0.1)
ABS. EOSINOPHILS: 0 10*3/uL (ref 0.0–0.4)
ABS. LYMPHOCYTES: 1.2 10*3/uL (ref 0.9–3.6)
ABS. MONOCYTES: 0.7 10*3/uL (ref 0.05–1.2)
ABS. NEUTROPHILS: 6.6 10*3/uL (ref 1.8–8.0)
BASOPHILS: 0 % (ref 0–2)
EOSINOPHILS: 0 % (ref 0–5)
HCT: 26.8 % — ABNORMAL LOW (ref 35.0–45.0)
HGB: 8.7 g/dL — ABNORMAL LOW (ref 12.0–16.0)
LYMPHOCYTES: 14 % — ABNORMAL LOW (ref 21–52)
MCH: 26.7 PG (ref 24.0–34.0)
MCHC: 32.5 g/dL (ref 31.0–37.0)
MCV: 82.2 FL (ref 74.0–97.0)
MONOCYTES: 8 % (ref 3–10)
MPV: 12.2 FL — ABNORMAL HIGH (ref 9.2–11.8)
NEUTROPHILS: 78 % — ABNORMAL HIGH (ref 40–73)
PLATELET: 181 10*3/uL (ref 135–420)
RBC: 3.26 M/uL — ABNORMAL LOW (ref 4.20–5.30)
RDW: 14.1 % (ref 11.6–14.5)
WBC: 8.5 10*3/uL (ref 4.6–13.2)

## 2014-08-16 LAB — METABOLIC PANEL, COMPREHENSIVE
A-G Ratio: 0.8 (ref 0.8–1.7)
ALT (SGPT): 12 U/L — ABNORMAL LOW (ref 13–56)
AST (SGOT): 16 U/L (ref 15–37)
Albumin: 2.2 g/dL — ABNORMAL LOW (ref 3.4–5.0)
Alk. phosphatase: 252 U/L — ABNORMAL HIGH (ref 45–117)
Anion gap: 11 mmol/L (ref 3.0–18)
BUN/Creatinine ratio: 11 — ABNORMAL LOW (ref 12–20)
BUN: 7 MG/DL (ref 7.0–18)
Bilirubin, total: 0.6 MG/DL (ref 0.2–1.0)
CO2: 23 mmol/L (ref 21–32)
Calcium: 7.9 MG/DL — ABNORMAL LOW (ref 8.5–10.1)
Chloride: 107 mmol/L (ref 100–108)
Creatinine: 0.65 MG/DL (ref 0.6–1.3)
GFR est AA: >60 mL/min/{1.73_m2} (ref >60)
GFR est non-AA: >60 mL/min/{1.73_m2} (ref >60)
Globulin: 2.9 g/dL (ref 2.0–4.0)
Glucose: 78 mg/dL (ref 74–99)
Potassium: 3.2 mmol/L — ABNORMAL LOW (ref 3.5–5.5)
Protein, total: 5.1 g/dL — ABNORMAL LOW (ref 6.4–8.2)
Sodium: 141 mmol/L (ref 136–145)

## 2014-08-16 LAB — LD
LD: 193 U/L (ref 81–234)
LD: 205 U/L (ref 81–234)

## 2014-08-16 LAB — TYPE & SCREEN
ABO/Rh(D): B POS
Antibody screen: NEGATIVE

## 2014-08-16 LAB — URIC ACID: Uric acid: 5.2 MG/DL (ref 2.6–7.2)

## 2014-08-16 MED ORDER — MISOPROSTOL 200 MCG TAB
200 mcg | Freq: Once | ORAL | Status: AC
Start: 2014-08-16 — End: 2014-08-16

## 2014-08-16 MED ORDER — LACTATED RINGERS BOLUS IV
INTRAVENOUS | Status: DC | PRN
Start: 2014-08-16 — End: 2014-08-16

## 2014-08-16 MED ORDER — LACTATED RINGERS IV
INTRAVENOUS | Status: DC
Start: 2014-08-16 — End: 2014-08-16

## 2014-08-16 MED ORDER — LIDOCAINE-EPINEPHRINE PF 2 %-1:200,000 IJ SOLN
2 %-1:00,000 | INTRAMUSCULAR | Status: AC
Start: 2014-08-16 — End: 2014-08-16
  Administered 2014-08-16: 16:00:00

## 2014-08-16 MED ORDER — LIDOCAINE-EPINEPHRINE PF 2 %-1:200,000 IJ SOLN
2 %-1:00,000 | INTRAMUSCULAR | Status: DC | PRN
Start: 2014-08-16 — End: 2014-08-17
  Administered 2014-08-16 (×2): via EPIDURAL

## 2014-08-16 MED ORDER — ROPIVACAINE 2 MG/ML INJECTION
2 mg/mL (0. %) | INTRAMUSCULAR | Status: AC
Start: 2014-08-16 — End: 2014-08-15
  Administered 2014-08-16: 04:00:00 via EPIDURAL

## 2014-08-16 MED ORDER — FENTANYL-ROPIVACAINE IN NS (PF) 1.5 MCG/ML-0.2% EPIDURAL
EPIDURAL | Status: DC
Start: 2014-08-16 — End: 2014-08-16
  Administered 2014-08-16 (×2): via EPIDURAL

## 2014-08-16 MED ORDER — FENTANYL-ROPIVACAINE IN NS (PF) 1.5 MCG/ML-0.2% EPIDURAL
EPIDURAL | Status: DC
Start: 2014-08-16 — End: 2014-08-16
  Administered 2014-08-16 (×3): via EPIDURAL

## 2014-08-16 MED ORDER — METHYLERGONOVINE MALEATE 0.2 MG/ML IJ SOLN
0.2 mg/mL (1 mL) | INTRAMUSCULAR | Status: DC | PRN
Start: 2014-08-16 — End: 2014-08-16

## 2014-08-16 MED ORDER — DIPHENHYDRAMINE HCL 50 MG/ML IJ SOLN
50 mg/mL | INTRAMUSCULAR | Status: DC | PRN
Start: 2014-08-16 — End: 2014-08-16

## 2014-08-16 MED ORDER — LIDOCAINE (PF) 10 MG/ML (1 %) IJ SOLN
10 mg/mL (1 %) | INTRAMUSCULAR | Status: DC | PRN
Start: 2014-08-16 — End: 2014-08-16

## 2014-08-16 MED ORDER — MISOPROSTOL 200 MCG TAB
200 mcg | ORAL | Status: AC
Start: 2014-08-16 — End: 2014-08-16
  Administered 2014-08-16: via RECTAL

## 2014-08-16 MED ORDER — LIDOCAINE 2 % MUCOSAL GEL
2 % | Status: DC | PRN
Start: 2014-08-16 — End: 2014-08-18

## 2014-08-16 MED ORDER — LACTATED RINGERS BOLUS IV
Freq: Once | INTRAVENOUS | Status: AC
Start: 2014-08-16 — End: 2014-08-16

## 2014-08-16 MED ORDER — MINERAL OIL ORAL
ORAL | Status: DC | PRN
Start: 2014-08-16 — End: 2014-08-16

## 2014-08-16 MED ORDER — OXYTOCIN 20 UNITS/1000 ML IN LACTATED RINGERS IV
20 unit/1,000 mL | INTRAVENOUS | Status: DC
Start: 2014-08-16 — End: 2014-08-18
  Administered 2014-08-16 (×15): via INTRAVENOUS

## 2014-08-16 MED ORDER — ALUMINUM-MAGNESIUM HYDROXIDE 200 MG-200 MG/5 ML ORAL SUSP
200-200 mg/5 mL | ORAL | Status: AC
Start: 2014-08-16 — End: 2014-08-15
  Administered 2014-08-16: 01:00:00 via ORAL

## 2014-08-16 MED ORDER — ACETAMINOPHEN 500 MG TAB
500 mg | ORAL | Status: AC
Start: 2014-08-16 — End: 2014-08-16
  Administered 2014-08-16: 08:00:00 via ORAL

## 2014-08-16 MED ORDER — OXYTOCIN 20 UNITS/1000 ML IN LACTATED RINGERS IV
20 unit/1,000 mL | Freq: Once | INTRAVENOUS | Status: AC
Start: 2014-08-16 — End: 2014-08-16

## 2014-08-16 MED ORDER — OXYTOCIN 20 UNITS/1000 ML IN LACTATED RINGERS IV
20 unit/1,000 mL | INTRAVENOUS | Status: DC
Start: 2014-08-16 — End: 2014-08-16
  Administered 2014-08-17: 01:00:00 via INTRAVENOUS

## 2014-08-16 MED ORDER — BUTORPHANOL TARTRATE 1 MG/ML INJECTION
1 mg/mL | INTRAMUSCULAR | Status: DC | PRN
Start: 2014-08-16 — End: 2014-08-16

## 2014-08-16 MED ORDER — TERBUTALINE 1 MG/ML SUB-Q
1 mg/mL | SUBCUTANEOUS | Status: DC | PRN
Start: 2014-08-16 — End: 2014-08-16

## 2014-08-16 MED ORDER — ONDANSETRON (PF) 4 MG/2 ML INJECTION
4 mg/2 mL | Freq: Four times a day (QID) | INTRAMUSCULAR | Status: DC | PRN
Start: 2014-08-16 — End: 2014-08-16
  Administered 2014-08-16: 08:00:00 via INTRAVENOUS

## 2014-08-16 MED FILL — LACTATED RINGERS IV: INTRAVENOUS | Qty: 1000

## 2014-08-16 MED FILL — LIDOCAINE 2 % MUCOSAL GEL: 2 % | Qty: 5

## 2014-08-16 MED FILL — MAG-AL 200 MG-200 MG/5 ML ORAL SUSPENSION: 200-200 mg/5 mL | ORAL | Qty: 30

## 2014-08-16 MED FILL — FENTANYL-ROPIVACAINE IN NS (PF) 1.5 MCG/ML-0.2% EPIDURAL: EPIDURAL | Qty: 100

## 2014-08-16 MED FILL — OXYTOCIN 20 UNITS/1000 ML IN LACTATED RINGERS IV: 20 unit/1,000 mL | INTRAVENOUS | Qty: 1000

## 2014-08-16 MED FILL — MISOPROSTOL 200 MCG TAB: 200 mcg | ORAL | Qty: 4

## 2014-08-16 MED FILL — ONDANSETRON HCL 4 MG/2 ML INTRAVENOUS SYRINGE: 4 mg/2 mL | INTRAVENOUS | Qty: 2

## 2014-08-16 MED FILL — NAROPIN (PF) 2 MG/ML (0.2 %) INJECTION SOLUTION: 2 mg/mL (0. %) | INTRAMUSCULAR | Qty: 20

## 2014-08-16 MED FILL — TYLENOL EXTRA STRENGTH 500 MG TABLET: 500 mg | ORAL | Qty: 2

## 2014-08-16 MED FILL — XYLOCAINE-MPF/EPINEPHRINE 2 %-1:200,000 INJECTION SOLUTION: 2 %-1:00,000 | INTRAMUSCULAR | Qty: 20

## 2014-08-16 NOTE — Op Note (Signed)
Delivery Note    Obstetrician:  Milus Mallickebecca Thibodeau Chisum Habenicht, MD    Pre-Delivery Diagnosis: Term pregnancy, Induced labor, Single fetus or Pregnancy complicated by: gestational HTN    Post-Delivery Diagnosis: Living newborn infant(s) or Female    Intrapartum Event: None    Procedure: Vacuum assisted delivery    Epidural: YES    Estimated Blood Loss: 500cc- cytotec 800 mcg placed per rectum    Episiotomy: none    Laceration(s):  2nd degree    Laceration(s) repair: YES    Presentation: Cephalic    Fetal Description: singleton    Fetal Position: Occiput Anterior    Birth Weight: 7#11    Apgar - One Minute: 8    Apgar - Five Minutes: 9    Umbilical Cord: Nuchal Cord x  1, 3 vessels present and Cord blood sent to lab for type, Rh, and Coombs' test    Specimens: none           Complications:  none           Cord Blood Results:   Information for the patient's newborn:  Arvilla MarketSpencer, Baby Boy A [[id number]][[id number]]   No results found for: PCTABR, ABORH, PCTDIG, BILI, ABORH, ABORHEXT    Prenatal Labs:     Lab Results   Component Value Date/Time    ABO/RH(D) B POSITIVE 08/15/2014 08:40 PM    HBSAG, EXTERNAL Negative 05/21/2014    HIV, EXTERNAL Negative 05/21/2014    RUBELLA, EXTERNAL Immune 05/21/2014    RPR, EXTERNAL Non-reactive 05/21/2014    GRBSTREP, EXTERNAL Negative 07/30/2014      With the head noted to be at +3 station, mitivac was applied to the fetal head towards the occiput, so as to aid rotation. With each contraction, the handle was pumped to achieve suction in the green band. Gentle steady traction was then applied in a downward direction, until the occiput had come out underneath the pubis and then this was transferred to an upward direction.    # of pop-offs: 1  #of contractions with suction: 3  Indication for suction: maternal exhaustion and repetitive variables to the 70s  Total time of application: 2min  Shoulder dystocia:NO    Attending Attestation: I was present and scrubbed for the entire procedure     Signed By:  Milus Mallickebecca Thibodeau Deniya Craigo, MD     August 16, 2014

## 2014-08-16 NOTE — Progress Notes (Signed)
Pt complaining of increasing labor pain. Epidural top-off with Lidocaine 2 % with epi 3 mls.   Pt reports feeling a lot better and not having pain with contractions.

## 2014-08-16 NOTE — Progress Notes (Addendum)
0715- Bedside and Verbal shift change report given to L. Ivin Bootyrews, Charity fundraiserN  (Cabin crewoncoming nurse) by Kathie RhodesS. Odom, RN/ E. Plemmons, RN Physiological scientist(offgoing nurse). Report included the following information SBAR, Kardex and MAR.     [phone]- Dr. Mariah MillingMorales at bedside, SVE 3/90, IUPC placed, patient tolerated well.    1000- Dr. Mariah MillingMorales at bedside, IUPC replaced due to patient states catheter is painful    1025- IUPC removed Dr. Mariah MillingMorales aware, patient tolerated well    1031- paged anesthesia    1037- spoke with anesthesia, made aware of patient discomfort, states will come and evaluate    1045- Decreased pitocin    1050- Anesthesia at bedside, re dosed epidural    1135- Dr. Mariah MillingMorales at bedside, SVE 3cm    1330- Spoke with Dr. Mariah MillingMorales, adv of SVE 4cm    1420- Anesthesia at bedside    1530- monitors adjusted     1540- Dr. Mariah MillingMorales at bedside    1634- monitor adjusted    1650- Patient placed on peanut ball     1842: Dena BilletK Lewis RN at bedside, Dr Katrinka BlazingSmith at bedside, S.Rhodes, RN at bedside for vacuum    1843: vacuum applied, pt pushing with ctx. mityvac mushroom cup, ref [id number]    1845: popoffx1, vacuum reapplied    1846: VMI delivered

## 2014-08-16 NOTE — Progress Notes (Signed)
Dr. Mariah MillingMorales at bedside for SVE. 2/80/-1

## 2014-08-16 NOTE — Other (Signed)
TRANSFER - OUT REPORT:    Verbal report given to C. Earlene Plateravis, Rn on Kelly Waters  being transferred to mother/baby for routine progression of care       Report consisted of patient???s Situation, Background, Assessment and   Recommendations(SBAR).     Information from the following report(s) SBAR, Kardex, Intake/Output, MAR and Recent Results was reviewed with the receiving nurse.    Lines:   Peripheral IV 08/15/14 Right Hand (Active)   Site Assessment Clean, dry, & intact 08/16/2014  7:30 AM   Phlebitis Assessment 0 08/16/2014  7:30 AM   Infiltration Assessment 0 08/16/2014  7:30 AM   Dressing Status Clean, dry, & intact 08/16/2014  7:30 AM   Dressing Type Transparent 08/16/2014  7:30 AM   Hub Color/Line Status Pink;Infusing 08/16/2014  7:30 AM        Opportunity for questions and clarification was provided.      Patient transported with:   Registered Nurse  Tech

## 2014-08-16 NOTE — Progress Notes (Addendum)
Bedside and Verbal shift change report given to S.Makoto Sellitto,Rn  by L.Ivin Bootyrews, Charity fundraiserN. Report included the following information Intake/Output, MAR and Recent Results.     1920 Pt in bed skin to skin with infant. Fundal check and assessment complete.    1935 Assist with breastfeeding. Infant unable to latch on at this time. Continue skin to skin.    2000 Assist with breastfeeding infant still unable to latch. Will continue skin to skin.    2045 Up to bathroom with assist for shower.     2108 Transferred to Mother/baby via w/c in stable condition.

## 2014-08-16 NOTE — Progress Notes (Signed)
Labor Progress Note  Patient seen, fetal heart rate and contraction pattern evaluated, patient examined. Had an epidural bolus. Noted increased, severe pain when IUPC placed, no contractions were picked up, IUPC removed and replaced with the same outcome. IUPC removed, pain stopped, external toco replaced.   Patient Vitals for the past 8 hrs:   BP Temp Pulse Resp   08/16/14 1115 138/78 mmHg - 82 -   08/16/14 1107 139/81 mmHg - 97 -   08/16/14 1100 145/78 mmHg - 84 -   08/16/14 1055 144/74 mmHg - 72 -   08/16/14 1046 124/67 mmHg - 77 -   08/16/14 1030 142/80 mmHg - 77 -   08/16/14 1015 137/67 mmHg - 71 -   08/16/14 1000 126/75 mmHg - 83 -   08/16/14 0945 138/72 mmHg - 74 -   08/16/14 0930 139/77 mmHg 98.2 ??F (36.8 ??C) 67 18   08/16/14 0915 128/66 mmHg - 65 -   08/16/14 0900 115/60 mmHg - 82 -   08/16/14 0845 128/74 mmHg - 72 -   08/16/14 0830 120/68 mmHg - 78 -   08/16/14 0815 116/71 mmHg - 79 -   08/16/14 0800 132/69 mmHg - 80 -   08/16/14 0745 133/72 mmHg - 87 -   08/16/14 0730 120/64 mmHg 98 ??F (36.7 ??C) 89 18   08/16/14 0715 120/54 mmHg - 84 18   08/16/14 0700 122/53 mmHg - 71 16   08/16/14 0645 122/57 mmHg - 73 20   08/16/14 0630 127/68 mmHg - 87 18   08/16/14 0615 126/65 mmHg - 85 20   08/16/14 0605 - 98.4 ??F (36.9 ??C) - -   08/16/14 0600 129/67 mmHg - 71 18   08/16/14 0545 - - - 16   08/16/14 0544 139/72 mmHg - 89 -   08/16/14 0530 116/59 mmHg - 80 16   08/16/14 0515 121/59 mmHg - 72 20   08/16/14 0500 137/66 mmHg - 77 18   08/16/14 0445 124/57 mmHg - 88 18   08/16/14 0430 131/65 mmHg - 75 18   08/16/14 0415 131/61 mmHg 98.1 ??F (36.7 ??C) 78 16   08/16/14 0400 136/70 mmHg - 87 18   08/16/14 0345 135/71 mmHg - 78 20       Physical Exam:  Cervical Exam:  4 cm dilated/90/-1  Uterine Activity: Frequency: Every 3-4 minutes  Fetal Heart Rate: Baseline: 125 per minute  Variability: moderate  Accelerations: yes  Decelerations: none  US vertex with OP position    Assessment/Plan:   Continue pit, epidural redosed and feeling better. Continue current mgt.    Signed By:  Etta Grandchildenee C Charlea Nardo, MD     August 16, 2014 11:40 AM

## 2014-08-16 NOTE — Progress Notes (Signed)
Labor Progress Note  Patient seen, fetal heart rate and contraction pattern evaluated, patient examined. Comfy with epidural.  Patient Vitals for the past 8 hrs:   BP Temp Pulse Resp SpO2   08/16/14 0100 104/57 mmHg - 85 - -   08/16/14 0045 141/72 mmHg - 83 - -   08/16/14 0030 135/70 mmHg - 82 - -   08/16/14 0015 136/74 mmHg - 82 - -   08/16/14 0000 123/89 mmHg - 78 - -   08/15/14 2345 (!) 140/91 mmHg - 77 - -   08/15/14 2332 - - - - 100 %   08/15/14 2330 137/80 mmHg - 79 16 -   08/15/14 2327 - - - - 100 %   08/15/14 2322 - - - - 99 %   08/15/14 2317 - - - - 100 %   08/15/14 2315 143/80 mmHg - 77 18 -   08/15/14 2312 - - - - 100 %   08/15/14 2307 - - - - 100 %   08/15/14 2302 - - - - 100 %   08/15/14 2300 134/66 mmHg 98.8 ??F (37.1 ??C) 80 18 -   08/15/14 2257 - - - - 100 %   08/15/14 2255 133/66 mmHg - 74 - -   08/15/14 2252 - - - - 99 %   08/15/14 2250 131/67 mmHg - 81 - -   08/15/14 2247 - - - - 100 %   08/15/14 2245 135/69 mmHg - 76 16 -   08/15/14 2242 - - - - 99 %   08/15/14 2240 139/74 mmHg - 83 - -   08/15/14 2237 - - - - 100 %   08/15/14 2235 129/63 mmHg - 86 - -   08/15/14 2232 - - - - 99 %   08/15/14 2230 146/71 mmHg - 74 - -   08/15/14 2227 - - - - 100 %   08/15/14 2225 142/70 mmHg - 87 - -   08/15/14 2222 - - - - 100 %   08/15/14 2220 148/77 mmHg - 89 - -   08/15/14 2217 - - - - 98 %   08/15/14 2215 142/83 mmHg - 80 - -   08/15/14 2214 - - - - 100 %   08/15/14 2209 - - - - 100 %   08/15/14 2200 141/70 mmHg - 79 - -   08/15/14 2145 140/75 mmHg - 74 - -   08/15/14 2130 132/69 mmHg - 83 - -   08/15/14 2115 137/78 mmHg - 71 - -   08/15/14 2110 151/81 mmHg - 81 - -   08/15/14 2100 134/82 mmHg 98 ??F (36.7 ??C) 70 - -   08/15/14 2020 143/83 mmHg - 75 - -   08/15/14 2000 130/68 mmHg - 76 - -   08/15/14 1940 142/88 mmHg - 80 - -   08/15/14 1920 (!) 142/91 mmHg 98.1 ??F (36.7 ??C) 91 16 -   08/15/14 1900 141/86 mmHg - 88 - -   08/15/14 1840 141/78 mmHg - 71 - -   08/15/14 1821 152/71 mmHg - 69 - -    08/15/14 1800 124/62 mmHg - 69 - -   08/15/14 1740 133/80 mmHg - 70 - -   08/15/14 1720 144/84 mmHg - 72 - -       Physical Exam:  Cervical Exam:  2 cm dilated    80% effaced    -1 station    Membranes:  AROM at previous exam  Uterine Activity: Frequency: Every 2-4 minutes  Fetal Heart Rate: Baseline: 135 per minute  Variability: moderate  Accelerations: yes    Assessment/Plan:  Reassuring fetal status, Labor  Continue expectant management Pit 4    Signed By:  Etta Grandchildenee C Aretha Levi, MD     August 16, 2014 1:12 AM

## 2014-08-16 NOTE — Progress Notes (Signed)
Labor Progress Note  Patient seen, fetal heart rate and contraction pattern evaluated, patient examined. Doing well, comfortable with epidural. Notes HA still resolved, no more visual changes. Having sharp back pain.    Patient Vitals for the past 8 hrs:   BP Temp Pulse Resp   08/16/14 1530 (!) 132/108 mmHg - 82 -   08/16/14 1500 (!) 142/91 mmHg 98.6 ??F (37 ??C) 82 18   08/16/14 1445 131/89 mmHg - - -   08/16/14 1430 150/74 mmHg - 71 -   08/16/14 1415 157/80 mmHg - 95 -   08/16/14 1400 137/72 mmHg - 88 -   08/16/14 1345 142/78 mmHg - 79 -   08/16/14 1330 140/89 mmHg - 86 -   08/16/14 1315 139/88 mmHg - 75 -   08/16/14 1300 134/76 mmHg - 70 -   08/16/14 1245 141/78 mmHg - 73 -   08/16/14 1230 140/85 mmHg 98.3 ??F (36.8 ??C) 69 18   08/16/14 1215 135/77 mmHg - 67 -   08/16/14 1200 136/86 mmHg - 85 -   08/16/14 1145 133/71 mmHg - 71 -   08/16/14 1130 125/66 mmHg - 73 -   08/16/14 1115 138/78 mmHg - 82 -   08/16/14 1107 139/81 mmHg - 97 -   08/16/14 1100 145/78 mmHg - 84 -   08/16/14 1055 144/74 mmHg - 72 -   08/16/14 1046 124/67 mmHg - 77 -   08/16/14 1030 142/80 mmHg - 77 -   08/16/14 1015 137/67 mmHg - 71 -   08/16/14 1000 126/75 mmHg - 83 -   08/16/14 0945 138/72 mmHg - 74 -   08/16/14 0930 139/77 mmHg 98.2 ??F (36.8 ??C) 67 18   08/16/14 0915 128/66 mmHg - 65 -   08/16/14 0900 115/60 mmHg - 82 -   08/16/14 0845 128/74 mmHg - 72 -   08/16/14 0830 120/68 mmHg - 78 -   08/16/14 0815 116/71 mmHg - 79 -   08/16/14 0800 132/69 mmHg - 80 -       Physical Exam:  Cervical Exam:  8 cm dilated    100% effaced    +1 station    Presenting Part: cephalic  Uterine Activity: Frequency: Every 2-3 minutes  Fetal Heart Rate: Baseline: 130 per minute  Variability: moderate  Accelerations: yes  Decelerations: none    Assessment/Plan:  Reassuring fetal status, Labor  Progressing normally, Continue plan for vaginal delivery Labile BPs, continue to watch for now    Signed By:  Etta Grandchildenee C Ami Mally, MD     August 16, 2014 3:46 PM

## 2014-08-16 NOTE — Other (Addendum)
TRANSFER - IN REPORT:    Verbal report received from LS. Odom RN(name) on Raylene EvertsLillian Ann-Meredith Carriker  being received from L/D(unit) for routine progression of care      Report consisted of patient???s Situation, Background, Assessment and   Recommendations(SBAR).     Information from the following report(s) SBAR, Procedure Summary and Intake/Output was reviewed with the receiving nurse.    Opportunity for questions and clarification was provided.      Assessment completed upon patient???s arrival to unit and care assumed. Pt oriented to the unit. Family at the bedside. Fundus firm and midline at umbilicus. Decreased moderate vaginal lochia noted. shown how to use dermaplast spray and tucks pads. Ice peri packs given and explained how to use.      0000-No change in status. resting quietly.     0335-staff in to see pt. No distress noted. Easily awakens. No c/o pain no change in lochia.

## 2014-08-16 NOTE — Progress Notes (Signed)
Labor Progress Note  Patient seen, fetal heart rate and contraction pattern evaluated, patient examined. HA completely resolved after throwing up. No SOB, RUQ pain, or changes in vision.  Patient Vitals for the past 8 hrs:   BP Temp Pulse Resp   08/16/14 0915 128/66 mmHg - 65 -   08/16/14 0900 115/60 mmHg - 82 -   08/16/14 0845 128/74 mmHg - 72 -   08/16/14 0830 120/68 mmHg - 78 -   08/16/14 0815 116/71 mmHg - 79 -   08/16/14 0800 132/69 mmHg - 80 -   08/16/14 0745 133/72 mmHg - 87 -   08/16/14 0730 120/64 mmHg 98 ??F (36.7 ??C) 89 18   08/16/14 0715 120/54 mmHg - 84 18   08/16/14 0700 122/53 mmHg - 71 16   08/16/14 0645 122/57 mmHg - 73 20   08/16/14 0630 127/68 mmHg - 87 18   08/16/14 0615 126/65 mmHg - 85 20   08/16/14 0605 - 98.4 ??F (36.9 ??C) - -   08/16/14 0600 129/67 mmHg - 71 18   08/16/14 0545 - - - 16   08/16/14 0544 139/72 mmHg - 89 -   08/16/14 0530 116/59 mmHg - 80 16   08/16/14 0515 121/59 mmHg - 72 20   08/16/14 0500 137/66 mmHg - 77 18   08/16/14 0445 124/57 mmHg - 88 18   08/16/14 0430 131/65 mmHg - 75 18   08/16/14 0415 131/61 mmHg 98.1 ??F (36.7 ??C) 78 16   08/16/14 0400 136/70 mmHg - 87 18   08/16/14 0345 135/71 mmHg - 78 20   08/16/14 0329 137/80 mmHg - 99 22   08/16/14 0315 132/72 mmHg - 85 18   08/16/14 0300 138/68 mmHg - 80 18   08/16/14 0245 132/70 mmHg - 84 18   08/16/14 0230 130/74 mmHg - 81 -   08/16/14 0215 127/64 mmHg - 82 20   08/16/14 0200 124/74 mmHg 98 ??F (36.7 ??C) 89 18   08/16/14 0145 128/69 mmHg - 84 18       Physical Exam:  Cervical Exam:  3 cm dilated    90% effaced    -1 station    Presenting Part: cephalic  Uterine Activity: Frequency: Every 2-3 minutes, IUPC placed  Fetal Heart Rate: Baseline: 130 per minute  Variability: moderate  Accelerations: yes  Decelerations: none    Recent Results (from the past 24 hour(s))   POC URINE MACROSCOPIC    Collection Time: 08/15/14  4:16 PM   Result Value Ref Range    Color YELLOW      Appearance CLEAR       Spec. gravity (POC) 1.015 1.003 - 1.030      pH, urine  (POC) 7.0 5.0 - 8.0      Protein (POC) 100 (A) NEG mg/dL    Glucose, urine (POC) NEGATIVE  NEG mg/dL    Ketones (POC) NEGATIVE  NEG mg/dL    Bilirubin (POC) NEGATIVE  NEG      Blood (POC) NEGATIVE  NEG      Urobilinogen (POC) 1.0 0.2 - 1.0 EU/dL    Nitrite (POC) NEGATIVE  NEG      Leukocyte esterase (POC) SMALL (A) NEG     CBC W/O DIFF    Collection Time: 08/15/14  5:10 PM   Result Value Ref Range    WBC 6.0 4.6 - 13.2 K/uL    RBC 3.43 (L) 4.20 - 5.30 M/uL    HGB 9.2 (  L) 12.0 - 16.0 g/dL    HCT 28.2 (L) 35.0 - 45.0 %    MCV 82.2 74.0 - 97.0 FL    MCH 26.8 24.0 - 34.0 PG    MCHC 32.6 31.0 - 37.0 g/dL    RDW 14.1 11.6 - 14.5 %    PLATELET 187 135 - 420 K/uL    MPV 12.0 (H) 9.2 - 69.4 FL   METABOLIC PANEL, COMPREHENSIVE    Collection Time: 08/15/14  5:10 PM   Result Value Ref Range    Sodium 141 136 - 145 mmol/L    Potassium 3.3 (L) 3.5 - 5.5 mmol/L    Chloride 107 100 - 108 mmol/L    CO2 26 21 - 32 mmol/L    Anion gap 8 3.0 - 18 mmol/L    Glucose 85 74 - 99 mg/dL    BUN 7 7.0 - 18 MG/DL    Creatinine 0.74 0.6 - 1.3 MG/DL    BUN/Creatinine ratio 9 (L) 12 - 20      GFR est AA >60 >60 ml/min/1.12m    GFR est non-AA >60 >60 ml/min/1.710m   Calcium 7.9 (L) 8.5 - 10.1 MG/DL    Bilirubin, total 0.5 0.2 - 1.0 MG/DL    ALT 12 (L) 13 - 56 U/L    AST 15 15 - 37 U/L    Alk. phosphatase 255 (H) 45 - 117 U/L    Protein, total 5.8 (L) 6.4 - 8.2 g/dL    Albumin 2.3 (L) 3.4 - 5.0 g/dL    Globulin 3.5 2.0 - 4.0 g/dL    A-G Ratio 0.7 (L) 0.8 - 1.7     LD    Collection Time: 08/15/14  5:10 PM   Result Value Ref Range    LD 193 81 - 234 U/L   URIC ACID    Collection Time: 08/15/14  5:10 PM   Result Value Ref Range    Uric acid 5.1 2.6 - 7.2 MG/DL   TYPE & SCREEN    Collection Time: 08/15/14  8:40 PM   Result Value Ref Range    Crossmatch Expiration 08/18/2014     ABO/Rh(D) B POSITIVE     Antibody screen NEG    CBC WITH AUTOMATED DIFF    Collection Time: 08/16/14  4:43 AM    Result Value Ref Range    WBC 8.5 4.6 - 13.2 K/uL    RBC 3.26 (L) 4.20 - 5.30 M/uL    HGB 8.7 (L) 12.0 - 16.0 g/dL    HCT 26.8 (L) 35.0 - 45.0 %    MCV 82.2 74.0 - 97.0 FL    MCH 26.7 24.0 - 34.0 PG    MCHC 32.5 31.0 - 37.0 g/dL    RDW 14.1 11.6 - 14.5 %    PLATELET 181 135 - 420 K/uL    MPV 12.2 (H) 9.2 - 11.8 FL    NEUTROPHILS 78 (H) 40 - 73 %    LYMPHOCYTES 14 (L) 21 - 52 %    MONOCYTES 8 3 - 10 %    EOSINOPHILS 0 0 - 5 %    BASOPHILS 0 0 - 2 %    ABS. NEUTROPHILS 6.6 1.8 - 8.0 K/UL    ABS. LYMPHOCYTES 1.2 0.9 - 3.6 K/UL    ABS. MONOCYTES 0.7 0.05 - 1.2 K/UL    ABS. EOSINOPHILS 0.0 0.0 - 0.4 K/UL    ABS. BASOPHILS 0.0 0.0 - 0.1 K/UL    DF  AUTOMATED     METABOLIC PANEL, COMPREHENSIVE    Collection Time: 08/16/14  4:43 AM   Result Value Ref Range    Sodium 141 136 - 145 mmol/L    Potassium 3.2 (L) 3.5 - 5.5 mmol/L    Chloride 107 100 - 108 mmol/L    CO2 23 21 - 32 mmol/L    Anion gap 11 3.0 - 18 mmol/L    Glucose 78 74 - 99 mg/dL    BUN 7 7.0 - 18 MG/DL    Creatinine 0.65 0.6 - 1.3 MG/DL    BUN/Creatinine ratio 11 (L) 12 - 20      GFR est AA >60 >60 ml/min/1.73m    GFR est non-AA >60 >60 ml/min/1.753m   Calcium 7.9 (L) 8.5 - 10.1 MG/DL    Bilirubin, total 0.6 0.2 - 1.0 MG/DL    ALT 12 (L) 13 - 56 U/L    AST 16 15 - 37 U/L    Alk. phosphatase 252 (H) 45 - 117 U/L    Protein, total 5.1 (L) 6.4 - 8.2 g/dL    Albumin 2.2 (L) 3.4 - 5.0 g/dL    Globulin 2.9 2.0 - 4.0 g/dL    A-G Ratio 0.8 0.8 - 1.7     URIC ACID    Collection Time: 08/16/14  4:43 AM   Result Value Ref Range    Uric acid 5.2 2.6 - 7.2 MG/DL       Assessment/Plan:  Reassuring fetal status, Latent labor, HA resolved, BPs good, pit 14, titrate to adequate, labs reassuring.    Signed By:  ReDagoberto ReefMD     August 16, 2014 9:32 AM

## 2014-08-16 NOTE — Progress Notes (Addendum)
Dr. Mariah MillingMorales contacted for patient complaint of headache pain. Informed of patient assessment and vital signs. New orders given for Tylenol.     0339 Pt up in bed nausea and vomiting. Zofran given as prescribed.    0510 SVE. Pt tolerated well.      0512 Repositioned on Rt side.    [id number] Dr. Mariah MillingMorales contacted, informed of patient lab results. H&H, uric acid and platlet count. No new orders given.     0555 Repositioned on Lt side.    [id number] E. Plemmons, RN in with patient for SVE. 2-3/80/-1

## 2014-08-16 NOTE — Other (Addendum)
1955: In room with S. Odom for assistance. Pt currently lying lateral left. Heart toned audible in 80s. Pt to lateral right. EFM adjusted. O2 applied at 10 lpm via face mask. IV fluid bolus started.     2000: Dr Mariah MillingMorales called and notified of prolonged deceleration to 80s and interventions. Aware baseline currently 105    2004: SVE done No cervical change 2/70/-2. Dr Mariah MillingMorales updated.     2040: orders to alert provider with BP greater that 160/110 or increased signs of preeclampsia.     2051: Courtesy call to lab for stat type and screen.     0200: In room to see pt. No complaints at this time. Epidural providing good pain relief.     [id number]: In room for assistance after pt vomiting. Pt states she feels much better and that HA is gone as well.     0345: Dr Mariah MillingMorales phoned unit to inquire about pt status. Updated on recent BPs. Last BP 140/90s was at 2345, provider aware.     [id number]: Courtesy call to lab for stat AM labs.     [id number]: In room with pt to provide ice chips. Pt reports HA "much better." Lights dimmed in room, pt has one visitor who is sleeping.     0430: Courtesy call to lab.     0440: Lab at bedside.

## 2014-08-16 NOTE — Op Note (Signed)
Delivery Note    Obstetrician:  Milus Mallickebecca Thibodeau Avielle Imbert, MD    Pre-Delivery Diagnosis: Term pregnancy, Induced labor, Single fetus or Pregnancy complicated by: gestational HTN    Post-Delivery Diagnosis: Living newborn infant(s) or Female    Intrapartum Event: None    Procedure: Vacuum assisted delivery    Epidural: YES    Estimated Blood Loss: 500cc- cytotec 800 mcg placed per rectum    Episiotomy: none    Laceration(s):  2nd degree    Laceration(s) repair: YES    Presentation: Cephalic    Fetal Description: singleton    Fetal Position: Occiput Anterior    Birth Weight: 7#11    Apgar - One Minute: 8    Apgar - Five Minutes: 9    Umbilical Cord: Nuchal Cord x  1, 3 vessels present and Cord blood sent to lab for type, Rh, and Coombs' test    Specimens: none           Complications:  none           Cord Blood Results:   Information for the patient's newborn:  Kelly Waters, Baby Boy A [[id number]]   No results found for: PCTABR, ABORH, PCTDIG, BILI, ABORH, ABORHEXT    Prenatal Labs:     Lab Results   Component Value Date/Time    ABO/RH(D) B POSITIVE 08/15/2014 08:40 PM    HBSAG, EXTERNAL Negative 05/21/2014    HIV, EXTERNAL Negative 05/21/2014    RUBELLA, EXTERNAL Immune 05/21/2014    RPR, EXTERNAL Non-reactive 05/21/2014    GRBSTREP, EXTERNAL Negative 07/30/2014      With the head noted to be at +3 station, mitivac was applied to the fetal head towards the occiput, so as to aid rotation. With each contraction, the handle was pumped to achieve suction in the green band. Gentle steady traction was then applied in a downward direction, until the occiput had come out underneath the pubis and then this was transferred to an upward direction.    # of pop-offs: 1  #of contractions with suction: 3  Indication for suction: maternal exhaustion and repetitive variables to the 70s  Total time of application: 2min  Shoulder dystocia:NO    Attending Attestation: I was present and scrubbed for the entire procedure    Signed By:  Milus Mallickebecca  Thibodeau Anselmo Reihl, MD     August 16, 2014

## 2014-08-17 LAB — HEMOGLOBIN: HGB: 7.2 g/dL — ABNORMAL LOW (ref 12.0–16.0)

## 2014-08-17 LAB — HEMATOCRIT: HCT: 21.6 % — ABNORMAL LOW (ref 35.0–45.0)

## 2014-08-17 MED ORDER — IBUPROFEN 400 MG TAB
400 mg | Freq: Three times a day (TID) | ORAL | Status: DC | PRN
Start: 2014-08-17 — End: 2014-08-18
  Administered 2014-08-17: 23:00:00 via ORAL

## 2014-08-17 MED ORDER — MEASLES,MUMPS&RUBELLA VACCIN(PF) 1,000-12,500 TCID50/0.5 ML SUB-Q SUSP
1000-12500 TCID50/0.5 mL | SUBCUTANEOUS | Status: DC | PRN
Start: 2014-08-17 — End: 2014-08-18

## 2014-08-17 MED ORDER — FERROUS SULFATE 325 MG (65 MG ELEMENTAL IRON) TAB
325 mg (65 mg iron) | Freq: Every day | ORAL | Status: DC
Start: 2014-08-17 — End: 2014-08-17

## 2014-08-17 MED ORDER — FERROUS SULFATE 325 MG (65 MG ELEMENTAL IRON) TAB
325 mg (65 mg iron) | Freq: Every day | ORAL | Status: DC
Start: 2014-08-17 — End: 2014-08-18
  Administered 2014-08-17: 15:00:00 via ORAL

## 2014-08-17 MED ORDER — GLYCERIN-WITCH HAZEL 12.5 %-50 % TOPICAL PADS
CUTANEOUS | Status: DC | PRN
Start: 2014-08-17 — End: 2014-08-18

## 2014-08-17 MED ORDER — ACETAMINOPHEN 325 MG TABLET
325 mg | ORAL | Status: DC | PRN
Start: 2014-08-17 — End: 2014-08-18

## 2014-08-17 MED ORDER — MODIFIED LANOLIN CREAM
CUTANEOUS | Status: DC | PRN
Start: 2014-08-17 — End: 2014-08-18

## 2014-08-17 MED ORDER — OXYCODONE-ACETAMINOPHEN 5 MG-325 MG TAB
5-325 mg | ORAL | Status: DC | PRN
Start: 2014-08-17 — End: 2014-08-18
  Administered 2014-08-17 – 2014-08-18 (×5): via ORAL

## 2014-08-17 MED ORDER — PROMETHAZINE 25 MG/ML INJECTION
25 mg/mL | INTRAMUSCULAR | Status: DC | PRN
Start: 2014-08-17 — End: 2014-08-18

## 2014-08-17 MED ORDER — SENNOSIDES-DOCUSATE SODIUM 8.6 MG-50 MG TAB
Freq: Two times a day (BID) | ORAL | Status: DC | PRN
Start: 2014-08-17 — End: 2014-08-18

## 2014-08-17 MED ORDER — ZOLPIDEM 5 MG TAB
5 mg | Freq: Every evening | ORAL | Status: DC | PRN
Start: 2014-08-17 — End: 2014-08-18

## 2014-08-17 MED ORDER — BENZOCAINE-MENTHOL 20 %-0.5 % TOPICAL AEROSOL
CUTANEOUS | Status: DC | PRN
Start: 2014-08-17 — End: 2014-08-18
  Administered 2014-08-17: 05:00:00 via TOPICAL

## 2014-08-17 MED ORDER — DOCUSATE SODIUM 100 MG CAP
100 mg | Freq: Every day | ORAL | Status: DC
Start: 2014-08-17 — End: 2014-08-18

## 2014-08-17 MED ORDER — RHO D IMMUNE GLOBULIN 300 MCG IM SYRG
1500 unit (300 mcg) | INTRAMUSCULAR | Status: DC | PRN
Start: 2014-08-17 — End: 2014-08-18

## 2014-08-17 MED FILL — M-M-R II (PF) 1,000-12,500 TCID50/0.5 ML SUBCUTANEOUS SOLUTION: 1000-12500 TCID50/0.5 mL | SUBCUTANEOUS | Qty: 1

## 2014-08-17 MED FILL — OXYCODONE-ACETAMINOPHEN 5 MG-325 MG TAB: 5-325 mg | ORAL | Qty: 2

## 2014-08-17 MED FILL — IBUPROFEN 400 MG TAB: 400 mg | ORAL | Qty: 2

## 2014-08-17 MED FILL — FERROUS SULFATE 325 MG (65 MG ELEMENTAL IRON) TAB: 325 mg (65 mg iron) | ORAL | Qty: 1

## 2014-08-17 MED FILL — OXYTOCIN 20 UNITS/1000 ML IN LACTATED RINGERS IV: 20 unit/1,000 mL | INTRAVENOUS | Qty: 1000

## 2014-08-17 MED FILL — DOCUSATE SODIUM 100 MG CAP: 100 mg | ORAL | Qty: 1

## 2014-08-17 MED FILL — MODIFIED LANOLIN CREAM: CUTANEOUS | Qty: 7

## 2014-08-17 MED FILL — GLYCERIN-WITCH HAZEL 12.5 %-50 % TOPICAL PADS: CUTANEOUS | Qty: 40

## 2014-08-17 MED FILL — DERMOPLAST (WITH MENTHOL) 20 %-0.5 % TOPICAL AEROSOL: CUTANEOUS | Qty: 56

## 2014-08-17 NOTE — Progress Notes (Addendum)
0720  VERBAL Bedside shift change report given to  Andrey CampanileWilson rnc  by [employer] EnergyCatina RN.   Report given with SBAR, Kardex, MAR and Recent Results.   [id number]  Assumed care. Nursing assessment completed.  1515  VS taken. Pt up and about in room.  1825  Stable 7a shift. Holding and bonding with baby.

## 2014-08-17 NOTE — Progress Notes (Signed)
Progress Note    Patient: Kelly EvertsLillian Ann-Meredith Uballe MRN: [id number]  SSN: [id number]    Date of Birth: [birth year]  Age: 19 y.o.  Sex: female      Subjective:     Postpartum Day: 1     Delivery: vaginal delivery    Pt denies complaints.       Objective:      No data found.    Insert Hgb Here              Uterine Fundus:   firm                 Lab/Data Review:  CBC:   Lab Results   Component Value Date/Time    HGB 7.2* 08/17/2014 06:30 AM    HCT 21.6* 08/17/2014 06:30 AM       Assessment:     Status post SVD.    Plan:     Postpartum care discussed including diet, ambulation, and actvitiy restrictions.  Discharge instructions and questions answered for vaginal delivery.    Signed By: Milus Mallickebecca Thibodeau Darianny Momon, MD     August 17, 2014

## 2014-08-17 NOTE — Lactation Note (Signed)
Mom requested and was given lanolin for breast and nipple care. Discussed proper latch and positioning of baby to the breast during feeds. Mom verbalized understanding and states she was also assisted by nursing staff.

## 2014-08-17 NOTE — Other (Addendum)
Verbal shift change report given to Domingo SepSonja L Bass, RN  (oncoming nurse) by Azell Der Wilson ,RN (offgoing nurse).  Report given with SBAR, Kardex, Intake/Output, MAR and Recent Results.     2015 Assessment completed

## 2014-08-17 NOTE — Anesthesia Post-Procedure Evaluation (Signed)
Patient doing well.  No c/o from her epidural.  She is AAO, VSS.  She is able to ambulate well with intact sensation on her bilateral lower extremities.  Epidural site is clean, dry, and intact  No complications noted, adequate analgesia from epidural.  Anesthesia signing off.    Saivion Goettel CRNA

## 2014-08-18 MED ORDER — OXYCODONE-ACETAMINOPHEN 5 MG-325 MG TAB
5-325 mg | ORAL_TABLET | ORAL | Status: DC | PRN
Start: 2014-08-18 — End: 2014-11-12

## 2014-08-18 MED ORDER — IBUPROFEN 800 MG TAB
800 mg | ORAL_TABLET | Freq: Three times a day (TID) | ORAL | Status: DC | PRN
Start: 2014-08-18 — End: 2014-09-09

## 2014-08-18 MED ORDER — DOCUSATE SODIUM 100 MG CAP
100 mg | ORAL_CAPSULE | Freq: Every day | ORAL | Status: DC
Start: 2014-08-18 — End: 2014-11-12

## 2014-08-18 MED ORDER — FERROUS SULFATE 325 MG (65 MG ELEMENTAL IRON) TAB
325 mg (65 mg iron) | ORAL_TABLET | Freq: Every day | ORAL | Status: DC
Start: 2014-08-18 — End: 2014-11-12

## 2014-08-18 MED FILL — OXYCODONE-ACETAMINOPHEN 5 MG-325 MG TAB: 5-325 mg | ORAL | Qty: 2

## 2014-08-18 MED FILL — DOCUSATE SODIUM 100 MG CAP: 100 mg | ORAL | Qty: 1

## 2014-08-18 MED FILL — FERROUS SULFATE 325 MG (65 MG ELEMENTAL IRON) TAB: 325 mg (65 mg iron) | ORAL | Qty: 1

## 2014-08-18 NOTE — Progress Notes (Signed)
Progress Note    Patient: Kelly EvertsLillian Ann-Meredith Bentivegna MRN: [id number]  SSN: [id number]    Date of Birth: November 23, 1995  Age: 19 y.o.  Sex: female      Subjective:     Postpartum Day: 2     Delivery: vaginal delivery    Pt denies complaints.       Objective:      No data found.    Insert Hgb Here              Uterine Fundus:   firm                 Lab/Data Review:  CBC: No results found for: WBC, HGB, HGBEXT, HCT, HCTEXT, PLT, PLTEXT, HGBEXT, HCTEXT, PLTEXT    Assessment:     Status post SVD.    Plan:     Postpartum care discussed including diet, ambulation, and actvitiy restrictions.  Discharge instructions and questions answered for vaginal delivery.    Signed By: Milus Mallickebecca Thibodeau Nydia Ytuarte, MD     August 18, 2014

## 2014-08-18 NOTE — Progress Notes (Addendum)
0720  VERBAL Bedside shift change report given to  Andrey CampanileWilson rnc  by Lyondell ChemicalSonja RN.   Report given with SBAR, Kardex, MAR and Recent Results.   0800  Assumed care. Nursing assessment completed.   0900  Baby in for rooming in. Pt crying while baby was gone. Concerned about separation. Reassurance given.  1330  Reviewed discharge instructions. Verbalized understanding. Copy given to pt.  1500  Discharged via w/c.

## 2014-08-18 NOTE — Discharge Summary (Signed)
Obstetrical Discharge Summary     Name: Kelly EvertsLillian Ann-Meredith Waters MRN: [id number]  SSN: [id number]    Date of Birth: 1995/12/25  Age: 19 y.o.  Sex: female      Admit Date: 08/15/2014    Discharge Date: 08/18/2014     Admitting Physician: Etta Grandchildenee C Morales, MD     Attending Physician:  Etta Grandchildenee C Morales, MD     Admission Diagnoses: Pregnancy;38WKS;CONTRACTIONS    Discharge Diagnoses:   Information for the patient's newborn:  Maryruth EveSpencer, Baby Boy A [[id number]][[id number]]   Delivery of a 3.5 kg female infant via 6:46 PM on 08/16/2014 at   by Rochel Bromeebecca T Jahki Witham. Apgars were 8 and 9.       Additional Diagnoses:   Problem List as of 08/18/2014  Never Reviewed          Codes Class Noted - Resolved    Pregnancy ICD-10-CM: Z33.1  ICD-9-CM: V22.2  08/15/2014 - Present             Lab Results   Component Value Date/Time    RUBELLA, EXTERNAL Immune 05/21/2014    GRBSTREP, EXTERNAL Negative 07/30/2014       Immunization(s): There is no immunization history for the selected administration types on file for this patient.     Hospital Course: Normal hospital course following the delivery.    Patient Instructions:   Current Discharge Medication List      START taking these medications    Details   ibuprofen (MOTRIN) 800 mg tablet Take 1 Tab by mouth every eight (8) hours as needed.  Qty: 30 Tab, Refills: 0      oxyCODONE-acetaminophen (PERCOCET) 5-325 mg per tablet Take 2 Tabs by mouth every four (4) hours as needed. Max Daily Amount: 12 Tabs.  Qty: 30 Tab, Refills: 0      !! ferrous sulfate 325 mg (65 mg iron) tablet Take 1 Tab by mouth daily (with breakfast).  Qty: 30 Tab, Refills: 0      docusate sodium (COLACE) 100 mg capsule Take 1 Cap by mouth daily for 90 days.  Qty: 30 Cap, Refills: 2       !! - Potential duplicate medications found. Please discuss with provider.      CONTINUE these medications which have NOT CHANGED    Details   !! ferrous sulfate 325 mg (65 mg iron) tablet Take 325 mg by mouth daily.       pnv w/o calcium-iron fum-fa 27-1 mg tab Take 1 Tab by mouth. Indications: PREGNANCY       !! - Potential duplicate medications found. Please discuss with provider.          Reference my discharge instructions.    Follow-up Appointments   Procedures   ??? FOLLOW UP VISIT Appointment in: Two Weeks     Standing Status: Standing      Number of Occurrences: 1      Standing Expiration Date:      Order Specific Question:  Appointment in     Answer:  Two Weeks        Signed By:  Milus Mallickebecca Thibodeau Klyde Banka, MD     August 18, 2014

## 2014-08-19 MED FILL — XYLOCAINE-MPF/EPINEPHRINE 2 %-1:200,000 INJECTION SOLUTION: 2 %-1:00,000 | INTRAMUSCULAR | Qty: 20

## 2014-09-09 ENCOUNTER — Inpatient Hospital Stay
Admit: 2014-09-09 | Discharge: 2014-09-09 | Disposition: A | Discharge status: Home / Self Care | Payer: MEDICAID | Attending: Emergency Medicine

## 2014-09-09 DIAGNOSIS — O9089 Other complications of the puerperium, not elsewhere classified: Secondary | ICD-10-CM

## 2014-09-09 MED ORDER — IBUPROFEN 800 MG TAB
800 mg | ORAL_TABLET | Freq: Three times a day (TID) | ORAL | Status: DC | PRN
Start: 2014-09-09 — End: 2014-11-12

## 2014-09-09 MED ORDER — CEPHALEXIN 500 MG CAP
500 mg | ORAL_CAPSULE | Freq: Three times a day (TID) | ORAL | Status: AC
Start: 2014-09-09 — End: 2014-09-16

## 2014-09-09 NOTE — ED Notes (Signed)
Patient states she has been breast feeding her 762 week old infant who was diagnosed with thrush by his pediatrician, she is experiencing a burning sensation during breast feeding and is worried that she may have contracted the infection.

## 2014-09-09 NOTE — ED Provider Notes (Signed)
HPI Comments: 19 year old female to the ED with C/O bilateral breast pain, nipple redness, and burning sensation after breast feeding times two weeks.  She just delivered her child here and this is her first time breastfeeding.  She has not had any postpartum care yet.  She plans to see Dr. Welton FlakesKhan on the 18th of February.  She denies any fevers, chills, tracking redness, bloody discharge, difficulty feeding.  Child does have thrush and she is worried she may have gotten an infection.     The history is provided by the patient.        Past Medical History:   Diagnosis Date   ??? Psychiatric problem      Panic attacks   ??? H/O birth trauma        History reviewed. No pertinent past surgical history.      History reviewed. No pertinent family history.    History     Social History   ??? Marital Status: MARRIED     Spouse Name: N/A     Number of Children: N/A   ??? Years of Education: N/A     Occupational History   ??? Not on file.     Social History Main Topics   ??? Smoking status: Never Smoker    ??? Smokeless tobacco: Not on file   ??? Alcohol Use: No   ??? Drug Use: No   ??? Sexual Activity: Not on file     Other Topics Concern   ??? Not on file     Social History Narrative           ALLERGIES: Latex, natural rubber; Blueberry; and Kiwi      Review of Systems   Constitutional: Negative for fever and chills.   HENT: Negative.    Eyes: Negative.    Respiratory: Negative for chest tightness and shortness of breath.    Cardiovascular: Negative for chest pain and palpitations.   Gastrointestinal: Negative for nausea, vomiting, abdominal pain and diarrhea.   Genitourinary: Negative.    Musculoskeletal: Negative.    Skin:        Nipple pain when breastfeeding   Neurological: Negative.        Filed Vitals:    09/09/14 1705   BP: 151/82   Pulse: 82   Temp: 97.8 ??F (36.6 ??C)   Resp: 20   SpO2: 99%            Physical Exam   Constitutional: She is oriented to person, place, and time. She appears  well-developed and well-nourished. No distress.   HENT:   Head: Normocephalic and atraumatic.   Eyes: EOM are normal. Pupils are equal, round, and reactive to light. No scleral icterus.   Neck: Neck supple. No tracheal deviation present.   Cardiovascular: Normal rate, regular rhythm and normal heart sounds.  Exam reveals no gallop and no friction rub.    No murmur heard.  Pulmonary/Chest: Effort normal and breath sounds normal. No respiratory distress. She has no wheezes. She has no rales.   Neurological: She is alert and oriented to person, place, and time. No cranial nerve deficit.   Skin: She is not diaphoretic.   Breast Exam: Bilateral nipple erythema without discharge, warmth, streaking erythema or nipple discharge.  Both nipples are TTP.  No masses appreciated.     Nursing note and vitals reviewed.       MDM  Number of Diagnoses or Management Options  Diagnosis management comments: Impression:  Breast pain  from breastfeeding.  Patient doesn't seem to have a frank mastitis, but it certainly could be the early beginning of it.  I will place her on Keflex.  Will have her follow up with Dr. Welton Flakes this week.  I will also have her take Motrin 20 minutes prior to breastfeeding. She agrees with the plan. Gifford Shave, PA 5:27 PM          Risk of Complications, Morbidity, and/or Mortality  Presenting problems: low  Diagnostic procedures: low  Management options: low    Patient Progress  Patient progress: stable      Procedures

## 2014-09-09 NOTE — ED Notes (Signed)
I have reviewed discharge instructions with the patient.  The patient verbalized understanding. Signature pad not working. Paper copy of discharge instructions signed by patient and placed in scan bin.

## 2014-11-12 ENCOUNTER — Inpatient Hospital Stay
Admit: 2014-11-12 | Discharge: 2014-11-13 | Disposition: A | Discharge status: Home / Self Care | Payer: Self-pay | Attending: Emergency Medicine

## 2014-11-12 DIAGNOSIS — F53 Puerperal psychosis: Secondary | ICD-10-CM

## 2014-11-12 NOTE — ED Provider Notes (Addendum)
HPI Comments: 7:37 PM Kelly Waters is a 19 y.o. female who presents to ED c/o depression onset 3 months ago after giving birth to son. Pt explains that, "I felt miserable right after I had him. I'm emotional, crying all the time. I hate myself." Pt states the sxs came on 3 days after giving birth to her son. Her son is now 35 months old. Pt claims the baby is eating and drinking well and does not feel that her emotional status is affecting his care. She states her husband works a lot and her mother is not at the house to help her. Pt denies any medical history. Pt does not have a PCP due to issurance issues. Pt also c/o decreased appetite, burning sensation when urinating. Pt denies any other symptoms at this time. Pt denies HI and SI. Pt denies alcohol use, drug use or smoking cigarettes.  No other concerns at this time.               Patient is a 19 y.o. female presenting with mental health disorder. The history is provided by the patient.   Mental Health Problem  The primary symptoms do not include hallucinations.   Additional symptoms of the illness include appetite change (decreased appetite). Additional symptoms of the illness do not include no headaches or no abdominal pain.        Past Medical History:   Diagnosis Date   ??? Psychiatric problem      Panic attacks   ??? H/O birth trauma        History reviewed. No pertinent past surgical history.      History reviewed. No pertinent family history.    History     Social History   ??? Marital Status: MARRIED     Spouse Name: N/A   ??? Number of Children: N/A   ??? Years of Education: N/A     Occupational History   ??? Not on file.     Social History Main Topics   ??? Smoking status: Never Smoker    ??? Smokeless tobacco: Not on file   ??? Alcohol Use: No   ??? Drug Use: No   ??? Sexual Activity:     Partners: Male     Other Topics Concern   ??? Not on file     Social History Narrative           ALLERGIES: Latex, natural rubber; Blueberry; and Kiwi       Review of Systems   Constitutional: Positive for appetite change (decreased appetite). Negative for fever and chills.   HENT: Negative.  Negative for congestion and rhinorrhea.    Eyes: Negative.    Respiratory: Negative.  Negative for cough and shortness of breath.    Cardiovascular: Negative.  Negative for chest pain and leg swelling.   Gastrointestinal: Negative.  Negative for nausea and abdominal pain.   Endocrine: Negative.    Genitourinary: Negative for dysuria and hematuria.        Burning sensation when urinating.   Musculoskeletal: Negative.  Negative for myalgias and arthralgias.   Skin: Negative.  Negative for rash and wound.   Allergic/Immunologic: Negative.    Neurological: Negative.  Negative for light-headedness and headaches.   Hematological: Negative.    Psychiatric/Behavioral: Negative for suicidal ideas, hallucinations and confusion.        Depressed   All other systems reviewed and are negative.      Filed Vitals:    11/12/14 1816  BP: 112/62   Pulse: 72   Temp: 97.5 ??F (36.4 ??C)   Resp: 18   SpO2: 99%            Physical Exam   Constitutional: She is oriented to person, place, and time. She appears well-developed and well-nourished.  Non-toxic appearance. She does not have a sickly appearance. She does not appear ill. No distress.   HENT:   Head: Normocephalic and atraumatic.   Mouth/Throat: Oropharynx is clear and moist. No oropharyngeal exudate.   Eyes: Conjunctivae and EOM are normal. Pupils are equal, round, and reactive to light. No scleral icterus.   Neck: Normal range of motion. Neck supple. No hepatojugular reflux and no JVD present. No tracheal deviation present. No thyromegaly present.   Cardiovascular: Normal rate, regular rhythm, S1 normal, S2 normal, normal heart sounds, intact distal pulses and normal pulses.  Exam reveals no gallop, no S3 and no S4.    No murmur heard.  Pulses:       Radial pulses are 2+ on the right side, and 2+ on the left side.         Dorsalis pedis pulses are 2+ on the right side, and 2+ on the left side.   Pulmonary/Chest: Effort normal and breath sounds normal. No respiratory distress. She has no decreased breath sounds. She has no wheezes. She has no rhonchi. She has no rales.   Abdominal: Soft. Normal appearance and bowel sounds are normal. She exhibits no distension and no mass. There is no hepatosplenomegaly. There is no tenderness. There is no rigidity, no rebound, no guarding, no CVA tenderness, no tenderness at McBurney's point and negative Murphy's sign.   Musculoskeletal: Normal range of motion.   Strength 5/5 throughout    Lymphadenopathy:        Head (right side): No submental, no submandibular, no preauricular and no occipital adenopathy present.        Head (left side): No submental, no submandibular, no preauricular and no occipital adenopathy present.     She has no cervical adenopathy.        Right: No supraclavicular adenopathy present.        Left: No supraclavicular adenopathy present.   Neurological: She is alert and oriented to person, place, and time. She has normal strength and normal reflexes. She is not disoriented. No cranial nerve deficit or sensory deficit. Coordination and gait normal. GCS eye subscore is 4. GCS verbal subscore is 5. GCS motor subscore is 6.   Grossly intact    Skin: Skin is warm, dry and intact. No rash noted. She is not diaphoretic.   Psychiatric: Her speech is normal and behavior is normal. Judgment and thought content normal. Cognition and memory are normal. She exhibits a depressed mood. She expresses no homicidal and no suicidal ideation. She expresses no suicidal plans and no homicidal plans.   Nursing note and vitals reviewed.       MDM  Number of Diagnoses or Management Options  Post partum depression:   Diagnosis management comments: Post partum depression         Procedures     UA negative.  UDS and Etoh negative.      9:15 PM Consult:  Discussed care with Rosey Batheresa, crisis. Standard discussion; including history of patient???s chief complaint, available diagnostic results, and treatment course. She saw the pt and gave outpatient information.  Recommends discharge.     9:19 PM Pt has been reassessed. Patient is feeling same. Discussed all results  with pt and pt agrees with plan for discharge. All questions answered at this time. ED warnings given for any new or worsening symptoms. Pt voices understanding. Pt discharged in stable condition.       Diagnosis:   1. Post partum depression            Follow-up Information     Follow up With Details Comments Contact Info    [facility] Schedule an appointment as soon as possible for a visit in 2 days  [address]  [place] [address]  [phone]    [facility] EMERGENCY DEPT  As needed, If symptoms worsen [address]  [place] [address]  [phone]            Scribe Attestation  I, Hermelinda Dellen, am scribing for, and in the presence of Dr. Maura Crandall, DO 8:00 PM, 11/12/2014.    Physician Attestation  I personally performed the services described in the documentation, reviewed the documentation, as recorded by the scribe in my presence, and it accurately and completely records my words and actions.    [employer], DO

## 2014-11-12 NOTE — Other (Signed)
Comprehensive Assessment Form Part 1    Section I - Disposition    Patient denies thoughts of wanting to harm self/others. No auditory or visual hallucinations. Able to contract for safety. She was given referral information for outpatient follow up.      Section II - Integrated Summary    This is a 19 year old female with no psych history who presented in the ED for a crisis evaluation. She just had a baby 3 months ago and she said she thinks that she is having postpartum depression. She said it started right after she had the baby. She said she gets so anxious and frustrated at times. States her "emotional state" is not getting any better. They (patient and husband) lives with her mother but says her mother is never home. Her grandmother keeps her baby when she works. She said she would never hurt the baby. She admits that she has had thoughts of wanting to harm herself about a month ago but not now. She has never had any inpatient or outpatient treatment.                     THERESA Steva ReadyP WARREN, RN

## 2014-11-12 NOTE — ED Notes (Signed)
Discharge instructions with resources given to pt, she states she feels safe to go home and with follow up tomorrow. All questions answered. Pt discharged to home with all belonging.

## 2014-11-12 NOTE — ED Notes (Signed)
Pt denies any thoughts of harming her baby, and states having only thoughts of harming herself but could not go through with it because of her baby.

## 2014-11-12 NOTE — ED Notes (Signed)
I performed a brief evaluation, including history and physical, of the patient here in triage and I have determined that pt will need further treatment and evaluation from the main side ER physician.  I have placed initial orders to help in expediting patients care.     November 12, 2014 at 6:20 PM - Renee Rivalaniel Isaac Paislyn Domenico, MD

## 2014-11-12 NOTE — ED Notes (Signed)
Pt states she is having "post partum depression", her baby is 743 months old and she states she is frequently tearful and feels depressed. Sometimes when she is in the car by herself she has thoughts of turning into oncoming traffic but "I know I can't do that because of my baby".

## 2014-11-13 LAB — URINALYSIS W/ RFLX MICROSCOPIC
Bilirubin: NEGATIVE
Blood: NEGATIVE
Glucose: NEGATIVE mg/dL
Ketone: NEGATIVE mg/dL
Leukocyte Esterase: NEGATIVE
Nitrites: NEGATIVE
Protein: NEGATIVE mg/dL
Specific gravity: 1.023 (ref 1.005–1.030)
Urobilinogen: 0.2 EU/dL (ref 0.2–1.0)
pH (UA): 7.5 (ref 5.0–8.0)

## 2014-11-13 LAB — DRUG SCREEN, URINE
AMPHETAMINES: NEGATIVE
BARBITURATES: NEGATIVE
BENZODIAZEPINES: NEGATIVE
COCAINE: NEGATIVE
METHADONE: NEGATIVE
OPIATES: NEGATIVE
PCP(PHENCYCLIDINE): NEGATIVE
THC (TH-CANNABINOL): NEGATIVE

## 2014-11-13 LAB — ETHYL ALCOHOL: ALCOHOL(ETHYL),SERUM: <3 MG/DL (ref 0–3)

## 2015-02-02 ENCOUNTER — Inpatient Hospital Stay
Admit: 2015-02-02 | Discharge: 2015-02-03 | Disposition: A | Discharge status: Home / Self Care | Payer: Self-pay | Attending: Emergency Medicine

## 2015-02-02 DIAGNOSIS — S61253A Open bite of left middle finger without damage to nail, initial encounter: Secondary | ICD-10-CM

## 2015-02-02 MED ORDER — RABIES VACCINE, PCEC 2.5 UNIT IM KIT
2.5 unit | INTRAMUSCULAR | Status: AC
Start: 2015-02-02 — End: 2015-02-02
  Administered 2015-02-03: 01:00:00 via INTRAMUSCULAR

## 2015-02-02 MED ORDER — DIPHTH,PERTUS(AC)TETANUS VAC(PF) 2.5 LF UNIT-8 MCG-5 LF/0.5 ML INJ
Freq: Once | INTRAMUSCULAR | Status: AC
Start: 2015-02-02 — End: 2015-02-02
  Administered 2015-02-03: 01:00:00 via INTRAMUSCULAR

## 2015-02-02 MED ORDER — BACITRACIN ZINC 500 UNIT/G OINTMENT
500 unit/gram | CUTANEOUS | Status: AC
Start: 2015-02-02 — End: 2015-02-02
  Administered 2015-02-03: 01:00:00 via TOPICAL

## 2015-02-02 MED ORDER — AMOXICILLIN CLAVULANATE 875 MG-125 MG TAB
875-125 mg | ORAL | Status: AC
Start: 2015-02-02 — End: 2015-02-02
  Administered 2015-02-03: 01:00:00 via ORAL

## 2015-02-02 MED ORDER — RABIES IMMUNE GLOBULIN 150 UNIT/ML IM
150 unit/mL | INTRAMUSCULAR | Status: AC
Start: 2015-02-02 — End: 2015-02-02
  Administered 2015-02-03: 01:00:00 via INTRAMUSCULAR

## 2015-02-02 MED FILL — HYPERRAB S/D (PF) 150 UNIT/ML INTRAMUSCULAR SOLUTION: 150 unit/mL | INTRAMUSCULAR | Qty: 12

## 2015-02-02 MED FILL — RABAVERT (PF) 2.5 UNIT INTRAMUSCULAR SUSPENSION: 2.5 unit | INTRAMUSCULAR | Qty: 1

## 2015-02-02 NOTE — ED Notes (Signed)
Pt. States  A Kitty cat bit her on her left middle finger 30 minutes ago

## 2015-02-02 NOTE — ED Provider Notes (Addendum)
HPI Comments: Patient is a 19 y.o. Caucasian female with the following medical history:   Past Medical History:    Psychiatric problem                                             Comment:Panic attacks    H/O birth trauma                                            Presents to the ED with cat bite to her left long finger lateral corner.  States she was playing with a 70 week old stray kitten that was dirty and had fleas when it bit her on the finger and caused her to bleed.  She cleaned the wound and a friend who cares for stray cats assumed care of the cat but she states the Animal control was closed and that her friend is not going to have the cat tested for rabies.  She would like to be treated for possible rabies and update her Td.  She also would like a pregnancy test.  She denies current pain or bleeding from the injury, denies headache, change or blurry vision, fever/chills, CP, palpitations, SOB, diaphoresis, orthopnea, abd or back pain, extremity edema/weakness/numbness/parestheisa, hot flashes, night sweats or unexplained change of weight.         Patient is a 19 y.o. female presenting with animal bite. The history is provided by the patient.   Animal Bite  This is a new problem. The current episode started 1 to 2 hours ago. The problem has not changed since onset.Pertinent negatives include no chest pain, no abdominal pain, no headaches and no shortness of breath. Nothing aggravates the symptoms. Nothing relieves the symptoms. Treatments tried: washed area. The treatment provided significant relief.        Past Medical History:   Diagnosis Date   ??? Psychiatric problem      Panic attacks   ??? H/O birth trauma        History reviewed. No pertinent past surgical history.      History reviewed. No pertinent family history.    History     Social History   ??? Marital Status: MARRIED     Spouse Name: N/A   ??? Number of Children: N/A   ??? Years of Education: N/A     Occupational History   ??? Not on file.      Social History Main Topics   ??? Smoking status: Never Smoker    ??? Smokeless tobacco: Not on file   ??? Alcohol Use: No   ??? Drug Use: No   ??? Sexual Activity:     Partners: Male     Other Topics Concern   ??? Not on file     Social History Narrative         ALLERGIES: Latex, natural rubber; Blueberry; and Kiwi    Review of Systems   Constitutional: Negative for fever, chills, activity change and appetite change.   HENT: Negative for trouble swallowing and voice change.    Eyes: Negative for discharge, redness and visual disturbance.   Respiratory: Negative for cough, chest tightness and shortness of breath.    Cardiovascular: Negative for chest pain and leg swelling.   Gastrointestinal: Negative  for nausea, vomiting, abdominal pain, diarrhea and constipation.   Genitourinary: Negative for dysuria, urgency, frequency, flank pain and difficulty urinating.   Musculoskeletal: Negative for back pain, joint swelling, neck pain and neck stiffness.   Skin: Positive for wound. Negative for color change and rash.   Neurological: Negative for dizziness, speech difficulty and headaches.   Psychiatric/Behavioral: Negative for behavioral problems and agitation. The patient is not nervous/anxious.        Filed Vitals:    02/02/15 1846   BP: 129/74   Pulse: 90   Temp: 97.8 ??F (36.6 ??C)   Resp: 12   Height:  (1.626 m)   Weight: 81.647 kg (180 lb)   SpO2: 100%            Physical Exam   Constitutional: She is oriented to person, place, and time. She appears well-developed and well-nourished. No distress.   HENT:   Head: Normocephalic.   Eyes: Pupils are equal, round, and reactive to light. Right eye exhibits no discharge. Left eye exhibits no discharge. No scleral icterus.   Neck: Normal range of motion. Neck supple. No JVD present. No tracheal deviation present.   Cardiovascular: Normal rate, regular rhythm and normal heart sounds.  Exam reveals no gallop and no friction rub.    No murmur heard.   Pulmonary/Chest: Effort normal and breath sounds normal. No stridor. No respiratory distress. She has no wheezes. She exhibits no tenderness.   Abdominal: Soft. Bowel sounds are normal. She exhibits no mass. There is no tenderness. There is no guarding.   Musculoskeletal: Normal range of motion. She exhibits no edema or tenderness.   Lymphadenopathy:     She has no cervical adenopathy.   Neurological: She is alert and oriented to person, place, and time. No cranial nerve deficit. Coordination normal.   Skin: Skin is warm and dry. Laceration (puncture) noted. No rash noted. No erythema.        Psychiatric: She has a normal mood and affect. Her behavior is normal. Judgment and thought content normal.   Nursing note and vitals reviewed.       MDM  Number of Diagnoses or Management Options  Cat bite of finger, initial encounter:   Need for prophylactic vaccination against rabies:   Need for prophylactic vaccination with tetanus-diphtheria (TD):   Diagnosis management comments: Minimal injury noted, puncture type with kitten and no suspect of FB and very low likelihood of infection or rabies but patient is insistent on immunization.  Will provide schedule of planned follow up which will need to occur through ED due to Sunday rotations.      The primary encounter diagnosis was Cat bite of finger, initial encounter. Diagnoses of Need for prophylactic vaccination against rabies and Need for prophylactic vaccination with tetanus-diphtheria (TD) were also pertinent to this visit.        Procedures

## 2015-02-02 NOTE — ED Notes (Signed)
I have reviewed discharge instructions with the patient.  The patient verbalized understanding.  Discharge medications reviewed with patient and appropriate educational materials and side effects teaching were provided.

## 2015-02-03 LAB — HCG URINE, QL: HCG urine, QL: NEGATIVE

## 2015-02-03 MED ORDER — RABIES VACCINE, PCEC 2.5 UNIT IM KIT
2.5 unit | Freq: Once | INTRAMUSCULAR | Status: AC
Start: 2015-02-03 — End: 2015-02-05

## 2015-02-03 MED ORDER — AMOXICILLIN CLAVULANATE 875 MG-125 MG TAB
875-125 mg | ORAL_TABLET | Freq: Two times a day (BID) | ORAL | Status: DC
Start: 2015-02-03 — End: 2015-02-16

## 2015-02-03 MED ORDER — NITROGLYCERIN 2 % TRANSDERMAL OINTMENT
2 % | TRANSDERMAL | Status: DC
Start: 2015-02-03 — End: 2015-02-02

## 2015-02-03 MED ORDER — AMOXICILLIN CLAVULANATE 875 MG-125 MG TAB
875-125 mg | ORAL_TABLET | Freq: Two times a day (BID) | ORAL | Status: DC
Start: 2015-02-03 — End: 2015-02-02

## 2015-02-03 MED FILL — AMOXICILLIN CLAVULANATE 875 MG-125 MG TAB: 875-125 mg | ORAL | Qty: 1

## 2015-02-03 MED FILL — BACITRACIN ZINC 500 UNIT/G OINTMENT: 500 unit/gram | CUTANEOUS | Qty: 15

## 2015-02-03 MED FILL — BOOSTRIX TDAP 2.5 LF UNIT-8 MCG-5 LF/0.5 ML INTRAMUSCULAR SYRINGE: INTRAMUSCULAR | Qty: 1

## 2015-02-05 ENCOUNTER — Inpatient Hospital Stay: Admit: 2015-02-05 | Payer: Self-pay

## 2015-02-05 DIAGNOSIS — Z203 Contact with and (suspected) exposure to rabies: Secondary | ICD-10-CM

## 2015-02-05 MED ORDER — RABIES VACCINE, PCEC 2.5 UNIT IM KIT
2.5 unit | INTRAMUSCULAR | Status: AC
Start: 2015-02-05 — End: 2015-02-05
  Administered 2015-02-05: 18:00:00 via INTRAMUSCULAR

## 2015-02-05 MED FILL — RABAVERT (PF) 2.5 UNIT INTRAMUSCULAR SUSPENSION: 2.5 unit | INTRAMUSCULAR | Qty: 1

## 2015-02-05 NOTE — Progress Notes (Signed)
[facility] Progress Note    Date: February 05, 2015    Name: Kelly AbtsLillian A Gruber    MRN: [id number]         DOB: 05/03/1996    RABIES VACCINE DAY 3    [name] was assessed and education was provided.  CareNotes reviewed with patient and signed.  Patient has no further questions at this time.      Ms. Jilda RocheSpencer's vitals were reviewed and patient was observed for 5 minutes prior to treatment.   Visit Vitals   Item Reading   ??? BP 136/76 mmHg   ??? Pulse 78   ??? Temp 98.6 ??F (37 ??C)   ??? Resp 18   ??? SpO2 98%   ??? Breastfeeding No       Rabies vaccine 2.5 units was administered as ordered, IM in patient's right deltoid.  Bandaid applied to site.      [name] tolerated well, and had no complaints.      Patient politely refused to stay for post injection observation due to transportation and needing to exchange vehicles with mother.  Patient stated she felt fine with last injection and feels okay to leave at this time.     Patient armband removed and shredded.    [name] [facility] in [facility] condition at 1410. She is to return on 02/09/2015 at 0830 for her next Rabies Vaccine injection appointment.    Bernestine Amassonya M Maerker, RN  February 05, 2015

## 2015-02-05 NOTE — Progress Notes (Signed)
SW asked to follow up regarding this case by ED Provider.  SW called [facility] and spoke with Jeryl ColumbiaValerie Gaskins, RN [phone] ext [phone] regarding the cat bite.  Per Vikki PortsValerie patient most likely has nothing to worry about given the age of the cat.  Also, SW spoke with the patient who stated the cat is not acting in any bizarre fashion and is eating and drinking normally.  Also, per patient, there was no saliva at the time of the bite.      Per HD, an incident report needs to be filled out and faxed to the health department at [id number] which is standard protocol.  HD will then contact Animal Control who will go out and speak to the patient and observe the animal as well.  SW will also make copies of the blank incident report form and place in Fast Track as HD stated [facility] rarely sends reports.       SW provided the patient with [facility] contact telephone number given her self-pay status.  Patient is also married with a 6mo but was told she does not qualify for any medicaid by DSS.  SW encouraged the patient to follow up with [facility].  Stated she will.

## 2015-02-05 NOTE — Progress Notes (Signed)
Patient advised by Animal Control to continue Rabies Vaccine Course.  [facility] and patient able to come in on her previously scheduled appointment for today 6/29 @2pm .  Patient advised she will be given a Charity application at time of visit.

## 2015-02-05 NOTE — Progress Notes (Signed)
Patient called unit and stated that she will not be coming today.  Patient stated that cat was micro-chipped and patient just found out that cat was up to date on all shots/vaccines.  Patient cancelled today's appointment.

## 2015-02-06 ENCOUNTER — Inpatient Hospital Stay

## 2015-02-07 MED ORDER — RABIES VACCINE, PCEC 2.5 UNIT IM KIT
2.5 unit | INTRAMUSCULAR | Status: AC
Start: 2015-02-07 — End: 2015-02-09
  Administered 2015-02-09: 13:00:00 via INTRAMUSCULAR

## 2015-02-07 MED FILL — RABAVERT (PF) 2.5 UNIT INTRAMUSCULAR SUSPENSION: 2.5 unit | INTRAMUSCULAR | Qty: 1

## 2015-02-09 ENCOUNTER — Inpatient Hospital Stay: Admit: 2015-02-09 | Payer: Self-pay

## 2015-02-09 DIAGNOSIS — Z203 Contact with and (suspected) exposure to rabies: Secondary | ICD-10-CM

## 2015-02-09 NOTE — Progress Notes (Signed)
[facility] Progress [facility]    Date: February 09, 2015    Name: Kelly Waters    MRN: [id number]         DOB: 23-Feb-1996    RABIES SERIES DAY 7    [phone].    Ms. Kelly Waters was assessed and education was provided.     Ms. Coonan's vitals were reviewed.  Visit Vitals   Item Reading   ??? BP 115/79 mmHg   ??? Pulse 79   ??? Temp 98 ??F (36.7 ??C)   ??? Resp 18   ??? SpO2 100%   ??? Breastfeeding No     Rabies vaccine 2.5 Units  was administered as ordered IM in patient's Left deltoid.  No bleeding observed from injection site.  Bandaid applied to site.     Ms. Kelly Waters tolerated well without complaints.    [facility]. Kelly [facility] at [phone].  She is to return on 02/16/2015 at 0830 for her next Rabies vaccine appointment.    Bernestine Amassonya M Maerker, RN  February 09, 2015

## 2015-02-13 MED ORDER — RABIES VACCINE, PCEC 2.5 UNIT IM KIT
2.5 unit | INTRAMUSCULAR | Status: AC
Start: 2015-02-13 — End: 2015-02-16
  Administered 2015-02-16: 13:00:00 via INTRAMUSCULAR

## 2015-02-13 MED FILL — RABAVERT (PF) 2.5 UNIT INTRAMUSCULAR SUSPENSION: 2.5 unit | INTRAMUSCULAR | Qty: 1

## 2015-02-16 ENCOUNTER — Inpatient Hospital Stay: Admit: 2015-02-16 | Payer: Self-pay

## 2015-02-16 NOTE — Progress Notes (Signed)
[facility] Progress Note    Date: February 16, 2015    Name: Melbourne AbtsLillian A Daversa    MRN: [id number]         [facility] April 24, 1996      [facility] at [phone], in stable condition, here for Day 14 Rabies Vaccine. She was assessed and education was provided.     Ms. Buccellato's vitals were reviewed.  Visit Vitals   Item Reading   ??? BP 128/74 mmHg   ??? Pulse 95   ??? Temp 98.5 ??F (36.9 ??C)   ??? Resp 16           Rabies Vaccine (RABAVERT) 2.5 Units, was administered IM in her right deltoid, at 0850, as ordered, and without incident.              Ms. Kelly Waters tolerated well, and had no complaints.    Ms. Kelly Waters [facility] in stable condition at 0900.   Since as of the completion of today's OPIC visit, Ms. Kelly Waters has completed the ordered Rabies Vaccination Series, she has no further OPIC appointments scheduled.     Lavon Paganiniomia A Newby, RN  February 16, 2015  8:45 AM

## 2015-02-20 ENCOUNTER — Inpatient Hospital Stay
Admit: 2015-02-20 | Discharge: 2015-02-20 | Disposition: A | Discharge status: Home / Self Care | Payer: Self-pay | Attending: Emergency Medicine

## 2015-02-20 DIAGNOSIS — N910 Primary amenorrhea: Secondary | ICD-10-CM

## 2015-02-20 LAB — URINALYSIS W/ RFLX MICROSCOPIC
Bilirubin: NEGATIVE
Blood: NEGATIVE
Glucose: NEGATIVE mg/dL
Ketone: NEGATIVE mg/dL
Leukocyte Esterase: NEGATIVE
Nitrites: NEGATIVE
Protein: NEGATIVE mg/dL
Specific gravity: 1.021 (ref 1.005–1.030)
Urobilinogen: 0.2 EU/dL (ref 0.2–1.0)
pH (UA): >8.5 — ABNORMAL HIGH (ref 5.0–8.0)

## 2015-02-20 LAB — HCG URINE, QL: HCG urine, QL: NEGATIVE

## 2015-02-20 NOTE — ED Provider Notes (Signed)
HPI Comments: Kelly Reichmann19yo female presents to ER because she is a week late on her menstrual cycle.  States she is worried something is wrong.  States she has taken 5 pregnancy test and all have been negative.  LMP was 01/15/15.  Denies any pain or other complaints.     Patient is a 19 y.o. female presenting with missed menses.   Missed Menses         Past Medical History:   Diagnosis Date   ??? Psychiatric problem      Panic attacks   ??? H/O birth trauma        No past surgical history on file.      No family history on file.    History     Social History   ??? Marital Status: MARRIED     Spouse Name: N/A   ??? Number of Children: N/A   ??? Years of Education: N/A     Occupational History   ??? Not on file.     Social History Main Topics   ??? Smoking status: Never Smoker    ??? Smokeless tobacco: Not on file   ??? Alcohol Use: No   ??? Drug Use: No   ??? Sexual Activity:     Partners: Male     Other Topics Concern   ??? Not on file     Social History Narrative         ALLERGIES: Latex, natural rubber; Blueberry; and Kiwi    Review of Systems   Constitutional: Negative for activity change and appetite change.   Respiratory: Negative.    Cardiovascular: Negative.    Gastrointestinal: Negative.    Genitourinary: Positive for menstrual problem and missed menses. Negative for dysuria, vaginal bleeding, vaginal discharge and pelvic pain.   Neurological: Negative.    All other systems reviewed and are negative.      Filed Vitals:    02/20/15 0027 02/20/15 0327   BP: 142/78 119/75   Pulse: 96 82   Temp: 98.2 ??F (36.8 ??C) 98 ??F (36.7 ??C)   Resp: 17 17   Weight: 95.528 kg (210 lb 9.6 oz)    SpO2: 96% 100%            Physical Exam   Constitutional: She is oriented to person, place, and time. She appears well-developed. No distress.   Overweight   Neck: Normal range of motion.   Pulmonary/Chest: Effort normal.   Musculoskeletal: Normal range of motion.   Neurological: She is alert and oriented to person, place, and time.    Skin: Skin is warm and dry. She is not diaphoretic.   Psychiatric: She has a normal mood and affect.   Nursing note and vitals reviewed.       MDM  Number of Diagnoses or Management Options  Diagnosis management comments: Kelly Reichmann19yo female presenting to ER concerned that her menstrual cycle is late and that she is not pregnant but concerned something may be wrong.  Will give [facility] referral and GYN referral.       Procedures

## 2015-02-20 NOTE — ED Notes (Signed)
Pt aox4. Pt has no complaints. Pt states "my cycle is off". Pt denies any discomfort. Pt states she will follow up with gynecologist. I have reviewed discharge instructions with the patient.  The patient verbalized understanding.Patient armband removed and given to patient to take home.  Patient was informed of the privacy risks if armband lost or stolen

## 2018-11-01 DIAGNOSIS — B373 Candidiasis of vulva and vagina: Secondary | ICD-10-CM | POA: Diagnosis not present

## 2018-11-01 DIAGNOSIS — Z3009 Encounter for other general counseling and advice on contraception: Secondary | ICD-10-CM | POA: Diagnosis not present

## 2018-11-01 DIAGNOSIS — Z01419 Encounter for gynecological examination (general) (routine) without abnormal findings: Secondary | ICD-10-CM | POA: Diagnosis not present

## 2018-11-01 DIAGNOSIS — N946 Dysmenorrhea, unspecified: Secondary | ICD-10-CM | POA: Diagnosis not present

## 2018-12-05 DIAGNOSIS — Z3009 Encounter for other general counseling and advice on contraception: Secondary | ICD-10-CM | POA: Diagnosis not present

## 2018-12-05 DIAGNOSIS — N915 Oligomenorrhea, unspecified: Secondary | ICD-10-CM | POA: Diagnosis not present

## 2019-12-13 ENCOUNTER — Telehealth: Payer: Self-pay | Admitting: Adult Health

## 2019-12-13 NOTE — Telephone Encounter (Signed)

## 2019-12-14 ENCOUNTER — Other Ambulatory Visit: Payer: Self-pay

## 2019-12-14 ENCOUNTER — Ambulatory Visit (INDEPENDENT_AMBULATORY_CARE_PROVIDER_SITE_OTHER): Payer: Medicaid Other | Admitting: Adult Health

## 2019-12-14 ENCOUNTER — Encounter: Payer: Self-pay | Admitting: Adult Health

## 2019-12-14 VITALS — BP 110/78 | HR 82 | Ht 64.75 in | Wt 248.0 lb

## 2019-12-14 DIAGNOSIS — O3680X0 Pregnancy with inconclusive fetal viability, not applicable or unspecified: Secondary | ICD-10-CM | POA: Diagnosis not present

## 2019-12-14 DIAGNOSIS — Z3A01 Less than 8 weeks gestation of pregnancy: Secondary | ICD-10-CM | POA: Insufficient documentation

## 2019-12-14 DIAGNOSIS — Z3201 Encounter for pregnancy test, result positive: Secondary | ICD-10-CM

## 2019-12-14 LAB — POCT URINE PREGNANCY: Preg Test, Ur: POSITIVE — AB

## 2019-12-14 NOTE — Progress Notes (Signed)
  Subjective:     Patient ID: Melanie Russo, female   DOB: Sep 16, 1995, 24 y.o.   MRN: [id number]  HPI [name] is a 24 year old white female,married, in for UPT, has missed a period and had 6+HPTs, has some nausea, breast tenderness and tired.  Review of Systems  +missed period with 6+HPTs +breast tenderness and tired  Reviewed past medical,surgical, social and family history. Reviewed medications and allergies.     Objective:   Physical Exam BP 110/78 (BP Location: Right Arm, Patient Position: Sitting, Cuff Size: Large)   Pulse 82   Ht 5' 4.75" (1.645 m)   Wt 248 lb (112.5 kg)   LMP 11/08/2019 (Exact Date)   BMI 41.59 kg/m UPT is +, about 5+1 week by LMP with EDD 08/14/20. Skin warm and dry. Neck: mid line trachea, normal thyroid, good ROM, no lymphadenopathy noted. Lungs: clear to ausculation bilaterally. 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 Positive pregnancy test Continue PNV  2. Less than [redacted] weeks gestation of pregnancy Eat often  3. Encounter to determine fetal viability of pregnancy, single or unspecified fetus Return on 2 weeks for dating [place]    Plan:     Review handout by [facility]

## 2019-12-27 DIAGNOSIS — O209 Hemorrhage in early pregnancy, unspecified: Secondary | ICD-10-CM | POA: Diagnosis not present

## 2019-12-27 DIAGNOSIS — O4691 Antepartum hemorrhage, unspecified, first trimester: Secondary | ICD-10-CM | POA: Diagnosis not present

## 2019-12-27 DIAGNOSIS — B373 Candidiasis of vulva and vagina: Secondary | ICD-10-CM | POA: Diagnosis not present

## 2019-12-27 DIAGNOSIS — O98811 Other maternal infectious and parasitic diseases complicating pregnancy, first trimester: Secondary | ICD-10-CM | POA: Diagnosis not present

## 2019-12-27 DIAGNOSIS — O23591 Infection of other part of genital tract in pregnancy, first trimester: Secondary | ICD-10-CM | POA: Diagnosis not present

## 2019-12-27 DIAGNOSIS — Z3A01 Less than 8 weeks gestation of pregnancy: Secondary | ICD-10-CM | POA: Diagnosis not present

## 2019-12-31 ENCOUNTER — Ambulatory Visit (INDEPENDENT_AMBULATORY_CARE_PROVIDER_SITE_OTHER): Payer: Medicaid Other

## 2019-12-31 ENCOUNTER — Other Ambulatory Visit: Payer: Self-pay

## 2019-12-31 DIAGNOSIS — Z3A01 Less than 8 weeks gestation of pregnancy: Secondary | ICD-10-CM

## 2019-12-31 DIAGNOSIS — O3680X0 Pregnancy with inconclusive fetal viability, not applicable or unspecified: Secondary | ICD-10-CM | POA: Diagnosis not present

## 2019-12-31 NOTE — Progress Notes (Signed)
[place] 7+4 wks,single IUP w/ys,positive fht 136 bpm,normal ovaries,crl 12.06 mm

## 2020-02-04 ENCOUNTER — Other Ambulatory Visit: Payer: Self-pay | Admitting: Obstetrics and Gynecology

## 2020-02-04 DIAGNOSIS — Z3682 Encounter for antenatal screening for nuchal translucency: Secondary | ICD-10-CM

## 2020-02-05 ENCOUNTER — Ambulatory Visit (INDEPENDENT_AMBULATORY_CARE_PROVIDER_SITE_OTHER): Payer: Medicaid Other | Admitting: Women's Health

## 2020-02-05 ENCOUNTER — Ambulatory Visit (INDEPENDENT_AMBULATORY_CARE_PROVIDER_SITE_OTHER): Payer: Medicaid Other

## 2020-02-05 ENCOUNTER — Encounter: Payer: Self-pay | Admitting: Women's Health

## 2020-02-05 ENCOUNTER — Ambulatory Visit: Payer: Medicaid Other | Admitting: *Deleted

## 2020-02-05 VITALS — BP 122/88 | HR 78 | Wt 238.0 lb

## 2020-02-05 DIAGNOSIS — Z3481 Encounter for supervision of other normal pregnancy, first trimester: Secondary | ICD-10-CM | POA: Diagnosis not present

## 2020-02-05 DIAGNOSIS — O99111 Other diseases of the blood and blood-forming organs and certain disorders involving the immune mechanism complicating pregnancy, first trimester: Secondary | ICD-10-CM

## 2020-02-05 DIAGNOSIS — Z1389 Encounter for screening for other disorder: Secondary | ICD-10-CM

## 2020-02-05 DIAGNOSIS — M3500 Sicca syndrome, unspecified: Secondary | ICD-10-CM | POA: Diagnosis not present

## 2020-02-05 DIAGNOSIS — Z8759 Personal history of other complications of pregnancy, childbirth and the puerperium: Secondary | ICD-10-CM

## 2020-02-05 DIAGNOSIS — Z3682 Encounter for antenatal screening for nuchal translucency: Secondary | ICD-10-CM | POA: Diagnosis not present

## 2020-02-05 DIAGNOSIS — O99341 Other mental disorders complicating pregnancy, first trimester: Secondary | ICD-10-CM | POA: Diagnosis not present

## 2020-02-05 DIAGNOSIS — Z8659 Personal history of other mental and behavioral disorders: Secondary | ICD-10-CM | POA: Insufficient documentation

## 2020-02-05 DIAGNOSIS — F418 Other specified anxiety disorders: Secondary | ICD-10-CM | POA: Diagnosis not present

## 2020-02-05 DIAGNOSIS — Z3480 Encounter for supervision of other normal pregnancy, unspecified trimester: Secondary | ICD-10-CM | POA: Diagnosis not present

## 2020-02-05 DIAGNOSIS — Z3A12 12 weeks gestation of pregnancy: Secondary | ICD-10-CM | POA: Diagnosis not present

## 2020-02-05 DIAGNOSIS — O99891 Other specified diseases and conditions complicating pregnancy: Secondary | ICD-10-CM | POA: Diagnosis not present

## 2020-02-05 DIAGNOSIS — O0993 Supervision of high risk pregnancy, unspecified, third trimester: Secondary | ICD-10-CM | POA: Insufficient documentation

## 2020-02-05 LAB — POCT URINALYSIS DIPSTICK OB
Blood, UA: NEGATIVE
Glucose, UA: NEGATIVE
Leukocytes, UA: NEGATIVE
Nitrite, UA: NEGATIVE

## 2020-02-05 MED ORDER — PANTOPRAZOLE SODIUM 20 MG PO TBEC
20.0000 mg | DELAYED_RELEASE_TABLET | Freq: Every day | ORAL | 6 refills | Status: DC
Start: 2020-02-05 — End: 2020-07-19

## 2020-02-05 MED ORDER — BLOOD PRESSURE MONITOR MISC: 0 refills | Status: DC

## 2020-02-05 NOTE — Progress Notes (Signed)
[place] 12+5 wks,measurements c/w dates,crl 60.56 mm,normal ovaries,NB present,NT 2.3 mm,fhr 158 bpm

## 2020-02-05 NOTE — Progress Notes (Signed)
   New [facility]  SUBJECTIVE:  Melanie Russo is a 24 y.o. G36P1001 female here for Panorama NIPT and Horizon Carrier Screening . She is [redacted]w[redacted]d pregnant.   OBJECTIVE:  Appears well, in no apparent distress  Blood work drawn from right [facility] without difficulty. 1 attempt(s).   ASSESSMENT: Pregnancy [redacted]w[redacted]d Panorama NIPT and Horizon Carrier Screening  PLAN: [facility] portal information given and instructed patient how to access results Follow-up as scheduled  [name]  02/05/2020 9:51 AM

## 2020-02-05 NOTE — Progress Notes (Signed)
INITIAL OBSTETRICAL VISIT Patient name: Melanie Russo MRN [id number]  Date of birth: May 08, 1996 Chief Complaint:   Initial Prenatal Visit (nt/it)  History of Present Illness:   Melanie Russo is a 24 y.o. G32P1001 Caucasian female at [redacted]w[redacted]d by LMP c/w u/s at 7 weeks with an Estimated Date of Delivery: 08/14/20 being seen today for her initial obstetrical visit.   Her obstetrical history is significant for term VAVB, had GHTN or pre-e, not sure which, delivered in [place].   Today she reports some nausea- declines meds. Dep/anx- no meds, declines need for meds, has thought about therapy before but doesn't think she wants to right now- will let us know if changes mind. Declines SI.  Sjogren's syndrome- was going to be treated w/ immunosuppressant and another medicine, but became pregnant.  Arthritis hands and toes Reflux- can't take TUMS, is allergic to berries/berry flavoring Depression screen [facility] 2/9 02/05/2020 12/14/2019  Decreased Interest 1 2  Down, Depressed, Hopeless 1 0  PHQ - 2 Score 2 2  Altered sleeping 3 3  Tired, decreased energy 3 3  Change in appetite 3 1  Feeling bad or failure about yourself  1 0  Trouble concentrating 1 2  Moving slowly or fidgety/restless 0 0  Suicidal thoughts 0 0  PHQ-9 Score 13 11  Difficult doing work/chores - Not difficult at all    Patient's last menstrual period was 11/08/2019 (exact date). Last pap 11/2017 at [facility]. Results were: normal Review of Systems:   Pertinent items are noted in HPI Denies cramping/contractions, leakage of fluid, vaginal bleeding, abnormal vaginal discharge w/ itching/odor/irritation, headaches, visual changes, shortness of breath, chest pain, abdominal pain, severe nausea/vomiting, or problems with urination or bowel movements unless otherwise stated above.  Pertinent History Reviewed:  Reviewed past medical,surgical, social, obstetrical and family history.  Reviewed problem list, medications and allergies. OB  History  Gravida Para Term Preterm AB Living  2 1 1     1   SAB TAB Ectopic Multiple Live Births          1    # Outcome Date GA Lbr Len/2nd Weight Sex Delivery Anes PTL Lv  2 Current           1 Term 08/16/14 [redacted]w[redacted]d  7 lb 11 oz (3.487 kg) M Vag-Vacuum EPI N LIV     Complications: Gestational hypertension   Physical Assessment:   Vitals:   02/05/20 0943  BP: 122/88  Pulse: 78  Weight: 238 lb (108 kg)  Body mass index is 39.91 kg/m.       Physical Examination:  General appearance - well appearing, and in no distress  Mental status - alert, oriented to person, place, and time  Psych:  She has a normal mood and affect  Skin - warm and dry, normal color, no suspicious lesions noted  Chest - effort normal, all lung fields clear to auscultation bilaterally  Heart - normal rate and regular rhythm  Abdomen - soft, nontender  Extremities:  No swelling or varicosities noted  Thin prep pap is not done   TODAY'S NT 02/07/20 12+5 wks,measurements c/w dates,crl 60.56 mm,normal ovaries,NB present,NT 2.3 mm,fhr 158 bpm   Results for orders placed or performed in visit on 02/05/20 (from the past 24 hour(s))  POC Urinalysis Dipstick OB   Collection Time: 02/05/20 10:21 AM  Result Value Ref Range   Color, UA     Clarity, UA     Glucose, UA Negative Negative   Bilirubin, UA  Ketones, UA small    Spec Grav, UA     Blood, UA neg    pH, UA     POC,PROTEIN,UA Trace Negative, Trace, Small (1+), Moderate (2+), Large (3+), 4+   Urobilinogen, UA     Nitrite, UA neg    Leukocytes, UA Negative Negative   Appearance     Odor      Assessment & Plan:  1) Low-Risk Pregnancy G2P1001 at [redacted]w[redacted]d with an Estimated Date of Delivery: 08/14/20   2) Initial OB visit  3) H/O GHTN/?pre-e> baseline labs today, ASA 162mg   4) Dep/anx> declines meds/therapy  5) Reflux> rx protonix  6) Sjogren's syndrome  Meds:  Meds ordered this encounter  Medications  . Blood Pressure Monitor MISC    Sig: For regular  home bp monitoring during pregnancy    Dispense:  1 each    Refill:  0    Z34.90 Needs large cuff    Initial labs obtained Continue prenatal vitamins Reviewed n/v relief measures and warning s/s to report Reviewed recommended weight gain based on pre-gravid BMI Encouraged well-balanced diet Genetic & carrier screening discussed: requests Panorama, NT/IT and Horizon 14  Ultrasound discussed; fetal survey: requested [facility]NC completed> form faxed if has or is planning to apply for medicaid The nature of for [employer] with multiple MDs and other Advanced Practice Providers was explained to patient; also emphasized that fellows, residents, and students are part of our team. Does not have home bp cuff. Rx faxed to [facility]. Check bp weekly, let [employer] know if >140/90.   Follow-up: Return in about 3 weeks (around 02/26/2020) for LROB, 2nd IT, CNM, in person.   Orders Placed This Encounter  Procedures  . GC/Chlamydia Probe Amp  . Urine Culture  . Integrated 1  . Genetic Screening  . CBC/D/Plt+RPR+Rh+ABO+Rub Ab...  . Pain Management Screening Profile (10S)  . Comprehensive metabolic panel  . Protein / creatinine ratio, urine  . POC Urinalysis Dipstick OB    02/28/2020 CNM, [facility] 02/05/2020 10:38 AM

## 2020-02-05 NOTE — Patient Instructions (Signed)
Melanie Russo, I greatly value your feedback.  If you receive a survey following your visit with [place] today, we appreciate you taking the time to fill it out.  Thanks, Joellyn Haff, CNM, WHNP-BC   [facility] ([address] [place], [place] [address]) Entrance C, located off of [address] valet parking   Begin taking 162mg  (two 81mg  tablets) baby aspirin daily to decrease the risk of preeclampsia during pregnancy     Nausea & Vomiting  Have saltine crackers or pretzels by your bed and eat a few bites before you raise your head out of bed in the morning  Eat small frequent meals throughout the day instead of large meals  Drink plenty of fluids throughout the day to stay hydrated, just don't drink a lot of fluids with your meals.  This can make your stomach fill up faster making you feel sick  Do not brush your teeth right after you eat  Products with real ginger are good for nausea, like ginger ale and ginger hard candy Make sure it says made with real ginger!  Sucking on sour candy like lemon heads is also good for nausea  If your prenatal vitamins make you nauseated, take them at night so you will sleep through the nausea  Sea Bands  If you feel like you need medicine for the nausea & vomiting please let know  If you are unable to keep any fluids or food down please let know   Constipation  Drink plenty of fluid, preferably water, throughout the day  Eat foods high in fiber such as fruits, vegetables, and grains  Exercise, such as walking, is a good way to keep your bowels regular  Drink warm fluids, especially warm prune juice, or decaf coffee  Eat a 1/2 cup of real oatmeal (not instant), 1/2 cup applesauce, and 1/2-1 cup warm prune juice every day  If needed, you may take Colace (docusate sodium) stool softener once or twice a day to help keep the stool soft.   If you still are having problems with constipation, you may  take Miralax once daily as needed to help keep your bowels regular.   Home Blood Pressure Monitoring for Patients   Your provider has recommended that you check your blood pressure (BP) at least once a week at home. If you do not have a blood pressure cuff at home, one will be provided for you. Contact your provider if you have not received your monitor within 1 week.   Helpful Tips for Accurate Home Blood Pressure Checks  . Don't smoke, exercise, or drink caffeine 30 minutes before checking your BP . Use the restroom before checking your BP (a full bladder can raise your pressure) . Relax in a comfortable upright chair . Feet on the ground . Left arm resting comfortably on a flat surface at the level of your heart . Legs uncrossed . Back supported . Sit quietly and don't talk . Place the cuff on your bare arm . Adjust snuggly, so that only two fingertips can fit between your skin and the top of the cuff . Check 2 readings separated by at least one minute . Keep a log of your BP readings . For a visual, please reference this diagram: http://ccnc.care/bpdiagram  Provider Name: [facility]     Phone: [phone]  Zone 1: ALL CLEAR  Continue to monitor your symptoms:  . BP reading is less than  140 (top number) or less than 90 (bottom number)  . No right upper stomach pain . No headaches or seeing spots . No feeling nauseated or throwing up . No swelling in face and hands  Zone 2: CAUTION Call your doctor's office for any of the following:  . BP reading is greater than 140 (top number) or greater than 90 (bottom number)  . Stomach pain under your ribs in the middle or right side . Headaches or seeing spots . Feeling nauseated or throwing up . Swelling in face and hands  Zone 3: EMERGENCY  Seek immediate medical care if you have any of the following:  . BP reading is greater than160 (top number) or greater than 110 (bottom number) . Severe headaches not improving with  Tylenol . Serious difficulty catching your breath . Any worsening symptoms from Zone 2    First Trimester of Pregnancy The first trimester of pregnancy is from week 1 until the end of week 12 (months 1 through 3). A week after a sperm fertilizes an egg, the egg will implant on the wall of the uterus. This embryo will begin to develop into a baby. Genes from you and your partner are forming the baby. The female genes determine whether the baby is a boy or a girl. At 6-8 weeks, the eyes and face are formed, and the heartbeat can be seen on ultrasound. At the end of 12 weeks, all the baby's organs are formed.  Now that you are pregnant, you will want to do everything you can to have a healthy baby. Two of the most important things are to get good prenatal care and to follow your health care provider's instructions. Prenatal care is all the medical care you receive before the baby's birth. This care will help prevent, find, and treat any problems during the pregnancy and childbirth. BODY CHANGES Your body goes through many changes during pregnancy. The changes vary from woman to woman.   You may gain or lose a couple of pounds at first.  You may feel sick to your stomach (nauseous) and throw up (vomit). If the vomiting is uncontrollable, call your health care provider.  You may tire easily.  You may develop headaches that can be relieved by medicines approved by your health care provider.  You may urinate more often. Painful urination may mean you have a bladder infection.  You may develop heartburn as a result of your pregnancy.  You may develop constipation because certain hormones are causing the muscles that push waste through your intestines to slow down.  You may develop hemorrhoids or swollen, bulging veins (varicose veins).  Your breasts may begin to grow larger and become tender. Your nipples may stick out more, and the tissue that surrounds them (areola) may become darker.  Your gums  may bleed and may be sensitive to brushing and flossing.  Dark spots or blotches (chloasma, mask of pregnancy) may develop on your face. This will likely fade after the baby is born.  Your menstrual periods will stop.  You may have a loss of appetite.  You may develop cravings for certain kinds of food.  You may have changes in your emotions from day to day, such as being excited to be pregnant or being concerned that something may go wrong with the pregnancy and baby.  You may have more vivid and strange dreams.  You may have changes in your hair. These can include thickening of your hair, rapid growth, and changes  in texture. Some women also have hair loss during or after pregnancy, or hair that feels dry or thin. Your hair will most likely return to normal after your baby is born. WHAT TO EXPECT AT YOUR PRENATAL VISITS During a routine prenatal visit:  You will be weighed to make sure you and the baby are growing normally.  Your blood pressure will be taken.  Your abdomen will be measured to track your baby's growth.  The fetal heartbeat will be listened to starting around week 10 or 12 of your pregnancy.  Test results from any previous visits will be discussed. Your health care provider may ask you:  How you are feeling.  If you are feeling the baby move.  If you have had any abnormal symptoms, such as leaking fluid, bleeding, severe headaches, or abdominal cramping.  If you have any questions. Other tests that may be performed during your first trimester include:  Blood tests to find your blood type and to check for the presence of any previous infections. They will also be used to check for low iron levels (anemia) and Rh antibodies. Later in the pregnancy, blood tests for diabetes will be done along with other tests if problems develop.  Urine tests to check for infections, diabetes, or protein in the urine.  An ultrasound to confirm the proper growth and development  of the baby.  An amniocentesis to check for possible genetic problems.  Fetal screens for spina bifida and Down syndrome.  You may need other tests to make sure you and the baby are doing well. HOME CARE INSTRUCTIONS  Medicines  Follow your health care provider's instructions regarding medicine use. Specific medicines may be either safe or unsafe to take during pregnancy.  Take your prenatal vitamins as directed.  If you develop constipation, try taking a stool softener if your health care provider approves. Diet  Eat regular, well-balanced meals. Choose a variety of foods, such as meat or vegetable-based protein, fish, milk and low-fat dairy products, vegetables, fruits, and whole grain breads and cereals. Your health care provider will help you determine the amount of weight gain that is right for you.  Avoid raw meat and uncooked cheese. These carry germs that can cause birth defects in the baby.  Eating four or five small meals rather than three large meals a day may help relieve nausea and vomiting. If you start to feel nauseous, eating a few soda crackers can be helpful. Drinking liquids between meals instead of during meals also seems to help nausea and vomiting.  If you develop constipation, eat more high-fiber foods, such as fresh vegetables or fruit and whole grains. Drink enough fluids to keep your urine clear or pale yellow. Activity and Exercise  Exercise only as directed by your health care provider. Exercising will help you:  Control your weight.  Stay in shape.  Be prepared for labor and delivery.  Experiencing pain or cramping in the lower abdomen or low back is a good sign that you should stop exercising. Check with your health care provider before continuing normal exercises.  Try to avoid standing for long periods of time. Move your legs often if you must stand in one place for a long time.  Avoid heavy lifting.  Wear low-heeled shoes, and practice good  posture.  You may continue to have sex unless your health care provider directs you otherwise. Relief of Pain or Discomfort  Wear a good support bra for breast tenderness.    Take  warm sitz baths to soothe any pain or discomfort caused by hemorrhoids. Use hemorrhoid cream if your health care provider approves.    Rest with your legs elevated if you have leg cramps or low back pain.  If you develop varicose veins in your legs, wear support hose. Elevate your feet for 15 minutes, 3-4 times a day. Limit salt in your diet. Prenatal Care  Schedule your prenatal visits by the twelfth week of pregnancy. They are usually scheduled monthly at first, then more often in the last 2 months before delivery.  Write down your questions. Take them to your prenatal visits.  Keep all your prenatal visits as directed by your health care provider. Safety  Wear your seat belt at all times when driving.  Make a list of emergency phone numbers, including numbers for family, friends, the hospital, and police and fire departments. General Tips  Ask your health care provider for a referral to a local prenatal education class. Begin classes no later than at the beginning of month 6 of your pregnancy.  Ask for help if you have counseling or nutritional needs during pregnancy. Your health care provider can offer advice or refer you to specialists for help with various needs.  Do not use hot tubs, steam rooms, or saunas.  Do not douche or use tampons or scented sanitary pads.  Do not cross your legs for long periods of time.  Avoid cat litter boxes and soil used by cats. These carry germs that can cause birth defects in the baby and possibly loss of the fetus by miscarriage or stillbirth.  Avoid all smoking, herbs, alcohol, and medicines not prescribed by your health care provider. Chemicals in these affect the formation and growth of the baby.  Schedule a dentist appointment. At home, brush your teeth with  a soft toothbrush and be gentle when you floss. SEEK MEDICAL CARE IF:   You have dizziness.  You have mild pelvic cramps, pelvic pressure, or nagging pain in the abdominal area.  You have persistent nausea, vomiting, or diarrhea.  You have a bad smelling vaginal discharge.  You have pain with urination.  You notice increased swelling in your face, hands, legs, or ankles. SEEK IMMEDIATE MEDICAL CARE IF:   You have a fever.  You are leaking fluid from your vagina.  You have spotting or bleeding from your vagina.  You have severe abdominal cramping or pain.  You have rapid weight gain or loss.  You vomit blood or material that looks like coffee grounds.  You are exposed to [place] measles and have never had them.  You are exposed to fifth disease or chickenpox.  You develop a severe headache.  You have shortness of breath.  You have any kind of trauma, such as from a fall or a car accident. Document Released: 07/20/2001 Document Revised: 12/10/2013 Document Reviewed: 06/05/2013 [facility] Patient Information 2015 [place], [place]. This information is not intended to replace advice given to you by your health care provider. Make sure you discuss any questions you have with your health care provider.

## 2020-02-06 DIAGNOSIS — Z3490 Encounter for supervision of normal pregnancy, unspecified, unspecified trimester: Secondary | ICD-10-CM | POA: Diagnosis not present

## 2020-02-06 LAB — PMP SCREEN PROFILE (10S), URINE
Amphetamine Scrn, Ur: NEGATIVE ng/mL
BARBITURATE SCREEN URINE: NEGATIVE ng/mL
BENZODIAZEPINE SCREEN, URINE: NEGATIVE ng/mL
CANNABINOIDS UR QL SCN: NEGATIVE ng/mL
Cocaine (Metab) Scrn, Ur: NEGATIVE ng/mL
Creatinine(Crt), U: 359.4 mg/dL — ABNORMAL HIGH (ref 20.0–300.0)
Methadone Screen, Urine: NEGATIVE ng/mL
OXYCODONE+OXYMORPHONE UR QL SCN: NEGATIVE ng/mL
Opiate Scrn, Ur: NEGATIVE ng/mL
Ph of Urine: 5.8 (ref 4.5–8.9)
Phencyclidine Qn, Ur: NEGATIVE ng/mL
Propoxyphene Scrn, Ur: NEGATIVE ng/mL

## 2020-02-07 LAB — CBC/D/PLT+RPR+RH+ABO+RUB AB...
Antibody Screen: NEGATIVE
Basophils Absolute: 0 10*3/uL (ref 0.0–0.2)
Basos: 0 %
EOS (ABSOLUTE): 0 10*3/uL (ref 0.0–0.4)
Eos: 1 %
HCV Ab: <0.1 s/co ratio (ref 0.0–0.9)
HIV Screen 4th Generation wRfx: NONREACTIVE
Hematocrit: 41.4 % (ref 34.0–46.6)
Hemoglobin: 13.5 g/dL (ref 11.1–15.9)
Hepatitis B Surface Ag: NEGATIVE
Immature Grans (Abs): 0 10*3/uL (ref 0.0–0.1)
Immature Granulocytes: 1 %
Lymphocytes Absolute: 1.6 10*3/uL (ref 0.7–3.1)
Lymphs: 20 %
MCH: 27.7 pg (ref 26.6–33.0)
MCHC: 32.6 g/dL (ref 31.5–35.7)
MCV: 85 fL (ref 79–97)
Monocytes Absolute: 0.6 10*3/uL (ref 0.1–0.9)
Monocytes: 7 %
Neutrophils Absolute: 6 10*3/uL (ref 1.4–7.0)
Neutrophils: 71 %
Platelets: 230 10*3/uL (ref 150–450)
RBC: 4.88 x10E6/uL (ref 3.77–5.28)
RDW: 13.9 % (ref 11.7–15.4)
RPR Ser Ql: NONREACTIVE
Rh Factor: POSITIVE
Rubella Antibodies, IGG: 1.47 index (ref >0.99)
WBC: 8.3 10*3/uL (ref 3.4–10.8)

## 2020-02-07 LAB — INTEGRATED 1
Crown Rump Length: 60.6 mm
Gest. Age on Collection Date: 12.3 weeks
Maternal Age at EDD: 25.6 yr
Nuchal Translucency (NT): 2.3 mm
Number of Fetuses: 1
PAPP-A Value: 604.3 ng/mL
Weight: 238 [lb_av]

## 2020-02-07 LAB — COMPREHENSIVE METABOLIC PANEL
ALT: 21 IU/L (ref 0–32)
AST: 21 IU/L (ref 0–40)
Albumin/Globulin Ratio: 1.5 (ref 1.2–2.2)
Albumin: 4.2 g/dL (ref 3.9–5.0)
Alkaline Phosphatase: 87 IU/L (ref 48–121)
BUN/Creatinine Ratio: 7 — ABNORMAL LOW (ref 9–23)
BUN: 4 mg/dL — ABNORMAL LOW (ref 6–20)
Bilirubin Total: 0.5 mg/dL (ref 0.0–1.2)
CO2: 19 mmol/L — ABNORMAL LOW (ref 20–29)
Calcium: 9.5 mg/dL (ref 8.7–10.2)
Chloride: 100 mmol/L (ref 96–106)
Creatinine, Ser: 0.55 mg/dL — ABNORMAL LOW (ref 0.57–1.00)
GFR calc Af Amer: 151 mL/min/{1.73_m2} (ref >59)
GFR calc non Af Amer: 131 mL/min/{1.73_m2} (ref >59)
Globulin, Total: 2.8 g/dL (ref 1.5–4.5)
Glucose: 93 mg/dL (ref 65–99)
Potassium: 3.8 mmol/L (ref 3.5–5.2)
Sodium: 135 mmol/L (ref 134–144)
Total Protein: 7 g/dL (ref 6.0–8.5)

## 2020-02-07 LAB — PROTEIN / CREATININE RATIO, URINE
Creatinine, Urine: 368.9 mg/dL
Protein, Ur: 32.8 mg/dL
Protein/Creat Ratio: 89 mg/g creat (ref 0–200)

## 2020-02-07 LAB — URINE CULTURE

## 2020-02-07 LAB — HCV INTERPRETATION

## 2020-02-15 ENCOUNTER — Telehealth: Payer: Self-pay | Admitting: *Deleted

## 2020-02-15 ENCOUNTER — Telehealth: Payer: Self-pay | Admitting: Women's Health

## 2020-02-15 ENCOUNTER — Encounter: Payer: Self-pay | Admitting: Women's Health

## 2020-02-15 DIAGNOSIS — Z141 Cystic fibrosis carrier: Secondary | ICD-10-CM | POA: Insufficient documentation

## 2020-02-15 DIAGNOSIS — O285 Abnormal chromosomal and genetic finding on antenatal screening of mother: Secondary | ICD-10-CM | POA: Insufficient documentation

## 2020-02-15 NOTE — Telephone Encounter (Signed)
Patient had called nurse line due to blood pressure. Telephoned patient at home number and patient states still has slight headache but has not tried tylenol. Advised patient could use Tylenol and also try to drink something with caffeine. Patient re checked blood pressure and was 132/67. Patient will monitor blood pressure and call if any changes.

## 2020-02-15 NOTE — Telephone Encounter (Signed)
Patient wants someone to review lab results that were drawn on 02/05/20

## 2020-02-25 ENCOUNTER — Encounter: Payer: Self-pay | Admitting: *Deleted

## 2020-02-26 ENCOUNTER — Encounter: Payer: Self-pay | Admitting: Women's Health

## 2020-02-26 ENCOUNTER — Ambulatory Visit (INDEPENDENT_AMBULATORY_CARE_PROVIDER_SITE_OTHER): Payer: Medicaid Other | Admitting: Women's Health

## 2020-02-26 VITALS — BP 140/95 | HR 110 | Wt 241.0 lb

## 2020-02-26 DIAGNOSIS — Z113 Encounter for screening for infections with a predominantly sexual mode of transmission: Secondary | ICD-10-CM | POA: Diagnosis not present

## 2020-02-26 DIAGNOSIS — Z1379 Encounter for other screening for genetic and chromosomal anomalies: Secondary | ICD-10-CM | POA: Diagnosis not present

## 2020-02-26 DIAGNOSIS — O285 Abnormal chromosomal and genetic finding on antenatal screening of mother: Secondary | ICD-10-CM

## 2020-02-26 DIAGNOSIS — Z3A15 15 weeks gestation of pregnancy: Secondary | ICD-10-CM

## 2020-02-26 DIAGNOSIS — Z3482 Encounter for supervision of other normal pregnancy, second trimester: Secondary | ICD-10-CM

## 2020-02-26 DIAGNOSIS — R5383 Other fatigue: Secondary | ICD-10-CM

## 2020-02-26 DIAGNOSIS — Z363 Encounter for antenatal screening for malformations: Secondary | ICD-10-CM

## 2020-02-26 DIAGNOSIS — Z331 Pregnant state, incidental: Secondary | ICD-10-CM

## 2020-02-26 DIAGNOSIS — Z141 Cystic fibrosis carrier: Secondary | ICD-10-CM

## 2020-02-26 DIAGNOSIS — R030 Elevated blood-pressure reading, without diagnosis of hypertension: Secondary | ICD-10-CM

## 2020-02-26 DIAGNOSIS — Z1389 Encounter for screening for other disorder: Secondary | ICD-10-CM

## 2020-02-26 DIAGNOSIS — O26812 Pregnancy related exhaustion and fatigue, second trimester: Secondary | ICD-10-CM

## 2020-02-26 LAB — POCT URINALYSIS DIPSTICK OB
Blood, UA: NEGATIVE
Glucose, UA: NEGATIVE
Ketones, UA: NEGATIVE
Leukocytes, UA: NEGATIVE
Nitrite, UA: NEGATIVE
POC,PROTEIN,UA: NEGATIVE

## 2020-02-26 NOTE — Progress Notes (Signed)
LOW-RISK PREGNANCY VISIT Patient name: Melanie Russo MRN [id number]  Date of birth: 10-25-1995 Chief Complaint:   Routine Prenatal Visit  History of Present Illness:   Melanie Russo is a 24 y.o. G52P1001 female at [redacted]w[redacted]d with an Estimated Date of Delivery: 08/14/20 being seen today for ongoing management of a low-risk pregnancy.  Depression screen [facility] 2/9 02/05/2020 12/14/2019  Decreased Interest 1 2  Down, Depressed, Hopeless 1 0  PHQ - 2 Score 2 2  Altered sleeping 3 3  Tired, decreased energy 3 3  Change in appetite 3 1  Feeling bad or failure about yourself  1 0  Trouble concentrating 1 2  Moving slowly or fidgety/restless 0 0  Suicidal thoughts 0 0  PHQ-9 Score 13 11  Difficult doing work/chores - Not difficult at all    Today she reports excessively fatigued. Supposed to be taking VitD. Hasn't been able to find baby asa anywhere- always out of stock. FOB not being tested for CF/glycosolation disorder. BPs elevated at home.  Contractions: Not present. Vag. Bleeding: None.  Movement: Absent. denies leaking of fluid. Review of Systems:   Pertinent items are noted in HPI Denies abnormal vaginal discharge w/ itching/odor/irritation, headaches, visual changes, shortness of breath, chest pain, abdominal pain, severe nausea/vomiting, or problems with urination or bowel movements unless otherwise stated above. Pertinent History Reviewed:  Reviewed past medical,surgical, social, obstetrical and family history.  Reviewed problem list, medications and allergies. Physical Assessment:   Vitals:   02/26/20 1018 02/26/20 1021  BP: (!) 142/91 (!) 140/95  Pulse: 98 (!) 110  Weight: 241 lb (109.3 kg)   Body mass index is 40.41 kg/m.        Physical Examination:   General appearance: Well appearing, and in no distress  Mental status: Alert, oriented to person, place, and time  Skin: Warm & dry  Cardiovascular: Normal heart rate noted  Respiratory: Normal respiratory effort, no  distress  Abdomen: Soft, gravid, nontender  Pelvic: Cervical exam deferred         Extremities: Edema: None  Fetal Status: Fetal Heart Rate (bpm): 151   Movement: Absent    Chaperone: n/a    Results for orders placed or performed in visit on 02/26/20 (from the past 24 hour(s))  POC Urinalysis Dipstick OB   Collection Time: 02/26/20 10:15 AM  Result Value Ref Range   Color, UA     Clarity, UA     Glucose, UA Negative Negative   Bilirubin, UA     Ketones, UA n    Spec Grav, UA     Blood, UA n    pH, UA     POC,PROTEIN,UA Negative Negative, Trace, Small (1+), Moderate (2+), Large (3+), 4+   Urobilinogen, UA     Nitrite, UA n    Leukocytes, UA Negative Negative   Appearance     Odor      Assessment & Plan:  1) Low-risk pregnancy G2P1001 at [redacted]w[redacted]d with an Estimated Date of Delivery: 08/14/20   2) Excessive fatigue, check CBC, TSH, VitD  3) 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 If elevated again next visit will meet criteria for [facility]   Meds: No orders of the defined types were placed in this encounter.  Labs/procedures today: 2nd IT, cbc, tsh vitd  Plan:  Continue routine obstetrical care  Next visit: prefers will be in person for anatomy u/s    Reviewed: Preterm labor symptoms and general obstetric precautions including but not limited to vaginal bleeding, contractions, leaking of fluid and fetal movement were reviewed in detail with the patient.  All questions were answered. Has home bp cuff.   Follow-up: Return in about 3 weeks (around 03/18/2020) for LROB, [id number], CNM, in person.  Orders Placed This Encounter  Procedures   GC/Chlamydia Probe Amp   US OB Comp + 14 Wk   INTEGRATED 2   TSH   VITAMIN D 25 Hydroxy (Vit-D Deficiency, Fractures)   CBC   POC Urinalysis Dipstick OB   Cheral Marker CNM, [facility] 02/26/2020 10:47 AM

## 2020-02-26 NOTE — Patient Instructions (Signed)
Melanie Russo, I greatly value your feedback.  If you receive a survey following your visit with [place] today, we appreciate you taking the time to fill it out.  Thanks, Joellyn Haff, CNM, WHNP-BC  [facility] ([address] [place], [place] [address]) Entrance C, located off of [address] valet parking  Go to [email or web address] to register for FREE online childbirth classes  [place] Pediatricians/Family Doctors:  [facility] [phone]            [facility] [phone]                 [facility] [phone] (usually not accepting new patients unless you have family there already, you are always welcome to call and ask)       [facility] Department [phone]       [facility] Pediatricians/Family Doctors:   [facility]: [phone]  [facility]: [phone]  [facility]: [phone]  [facility] Doctors:   [facility]: [phone]   [facility]: [phone]  [facility] Doctors:  [facility]: [phone]    Home Blood Pressure Monitoring for Patients   Your provider has recommended that you check your blood pressure (BP) at least once a week at home. If you do not have a blood pressure cuff at home, one will be provided for you. Contact your provider if you have not received your monitor within 1 week.   Helpful Tips for Accurate Home Blood Pressure Checks  . Don't smoke, exercise, or drink caffeine 30 minutes before checking your BP . Use the restroom before checking your BP (a full bladder can raise your pressure) . Relax in a comfortable upright chair . Feet on the ground . Left arm resting comfortably on a flat surface at the level of your heart . Legs uncrossed . Back supported . Sit quietly and don't talk . Place the cuff on your bare arm . Adjust snuggly, so  that only two fingertips can fit between your skin and the top of the cuff . Check 2 readings separated by at least one minute . Keep a log of your BP readings . For a visual, please reference this diagram: http://ccnc.care/bpdiagram  Provider Name: [facility]     Phone: [phone]  Zone 1: ALL CLEAR  Continue to monitor your symptoms:  . BP reading is less than 140 (top number) or less than 90 (bottom number)  . No right upper stomach pain . No headaches or seeing spots . No feeling nauseated or throwing up . No swelling in face and hands  Zone 2: CAUTION Call your doctor's office for any of the following:  . BP reading is greater than 140 (top number) or greater than 90 (bottom number)  . Stomach pain under your ribs in the middle or right side . Headaches or seeing spots . Feeling nauseated or throwing up . Swelling in face and hands  Zone 3: EMERGENCY  Seek immediate medical care if you have any of the following:  . BP reading is greater than160 (top number) or greater than 110 (bottom number) . Severe headaches not improving with Tylenol . Serious difficulty catching your breath . Any worsening symptoms from Zone 2     Second Trimester of Pregnancy The second trimester is from week 14 through week 27 (months 4 through 6). The second trimester is often a time when you feel your best.  Your body has adjusted to being pregnant, and you begin to feel better physically. Usually, morning sickness has lessened or quit completely, you may have more energy, and you may have an increase in appetite. The second trimester is also a time when the fetus is growing rapidly. At the end of the sixth month, the fetus is about 9 inches long and weighs about 1 pounds. You will likely begin to feel the baby move (quickening) between 16 and 20 weeks of pregnancy. Body changes during your second trimester Your body continues to go through many changes during your second trimester. The  changes vary from woman to woman.  Your weight will continue to increase. You will notice your lower abdomen bulging out.  You may begin to get stretch marks on your hips, abdomen, and breasts.  You may develop headaches that can be relieved by medicines. The medicines should be approved by your health care provider.  You may urinate more often because the fetus is pressing on your bladder.  You may develop or continue to have heartburn as a result of your pregnancy.  You may develop constipation because certain hormones are causing the muscles that push waste through your intestines to slow down.  You may develop hemorrhoids or swollen, bulging veins (varicose veins).  You may have back pain. This is caused by: ? Weight gain. ? Pregnancy hormones that are relaxing the joints in your pelvis. ? A shift in weight and the muscles that support your balance.  Your breasts will continue to grow and they will continue to become tender.  Your gums may bleed and may be sensitive to brushing and flossing.  Dark spots or blotches (chloasma, mask of pregnancy) may develop on your face. This will likely fade after the baby is born.  A dark line from your belly button to the pubic area (linea nigra) may appear. This will likely fade after the baby is born.  You may have changes in your hair. These can include thickening of your hair, rapid growth, and changes in texture. Some women also have hair loss during or after pregnancy, or hair that feels dry or thin. Your hair will most likely return to normal after your baby is born.  What to expect at prenatal visits During a routine prenatal visit:  You will be weighed to make sure you and the fetus are growing normally.  Your blood pressure will be taken.  Your abdomen will be measured to track your baby's growth.  The fetal heartbeat will be listened to.  Any test results from the previous visit will be discussed.  Your health care  provider may ask you:  How you are feeling.  If you are feeling the baby move.  If you have had any abnormal symptoms, such as leaking fluid, bleeding, severe headaches, or abdominal cramping.  If you are using any tobacco products, including cigarettes, chewing tobacco, and electronic cigarettes.  If you have any questions.  Other tests that may be performed during your second trimester include:  Blood tests that check for: ? Low iron levels (anemia). ? High blood sugar that affects pregnant women (gestational diabetes) between 64 and 28 weeks. ? Rh antibodies. This is to check for a protein on red blood cells (Rh factor).  Urine tests to check for infections, diabetes, or protein in the urine.  An ultrasound to confirm the proper growth and development of the baby.  An amniocentesis to check for possible genetic problems.  Fetal screens  for spina bifida and Down syndrome.  HIV (human immunodeficiency virus) testing. Routine prenatal testing includes screening for HIV, unless you choose not to have this test.  Follow these instructions at home: Medicines  Follow your health care provider's instructions regarding medicine use. Specific medicines may be either safe or unsafe to take during pregnancy.  Take a prenatal vitamin that contains at least 600 micrograms (mcg) of folic acid.  If you develop constipation, try taking a stool softener if your health care provider approves. Eating and drinking  Eat a balanced diet that includes fresh fruits and vegetables, whole grains, good sources of protein such as meat, eggs, or tofu, and low-fat dairy. Your health care provider will help you determine the amount of weight gain that is right for you.  Avoid raw meat and uncooked cheese. These carry germs that can cause birth defects in the baby.  If you have low calcium intake from food, talk to your health care provider about whether you should take a daily calcium  supplement.  Limit foods that are high in fat and processed sugars, such as fried and sweet foods.  To prevent constipation: ? Drink enough fluid to keep your urine clear or pale yellow. ? Eat foods that are high in fiber, such as fresh fruits and vegetables, whole grains, and beans. Activity  Exercise only as directed by your health care provider. Most women can continue their usual exercise routine during pregnancy. Try to exercise for 30 minutes at least 5 days a week. Stop exercising if you experience uterine contractions.  Avoid heavy lifting, wear low heel shoes, and practice good posture.  A sexual relationship may be continued unless your health care provider directs you otherwise. Relieving pain and discomfort  Wear a good support bra to prevent discomfort from breast tenderness.  Take warm sitz baths to soothe any pain or discomfort caused by hemorrhoids. Use hemorrhoid cream if your health care provider approves.  Rest with your legs elevated if you have leg cramps or low back pain.  If you develop varicose veins, wear support hose. Elevate your feet for 15 minutes, 3-4 times a day. Limit salt in your diet. Prenatal Care  Write down your questions. Take them to your prenatal visits.  Keep all your prenatal visits as told by your health care provider. This is important. Safety  Wear your seat belt at all times when driving.  Make a list of emergency phone numbers, including numbers for family, friends, the hospital, and police and fire departments. General instructions  Ask your health care provider for a referral to a local prenatal education class. Begin classes no later than the beginning of month 6 of your pregnancy.  Ask for help if you have counseling or nutritional needs during pregnancy. Your health care provider can offer advice or refer you to specialists for help with various needs.  Do not use hot tubs, steam rooms, or saunas.  Do not douche or use  tampons or scented sanitary pads.  Do not cross your legs for long periods of time.  Avoid cat litter boxes and soil used by cats. These carry germs that can cause birth defects in the baby and possibly loss of the fetus by miscarriage or stillbirth.  Avoid all smoking, herbs, alcohol, and unprescribed drugs. Chemicals in these products can affect the formation and growth of the baby.  Do not use any products that contain nicotine or tobacco, such as cigarettes and e-cigarettes. If you need help quitting,  ask your health care provider.  Visit your dentist if you have not gone yet during your pregnancy. Use a soft toothbrush to brush your teeth and be gentle when you floss. Contact a health care provider if:  You have dizziness.  You have mild pelvic cramps, pelvic pressure, or nagging pain in the abdominal area.  You have persistent nausea, vomiting, or diarrhea.  You have a bad smelling vaginal discharge.  You have pain when you urinate. Get help right away if:  You have a fever.  You are leaking fluid from your vagina.  You have spotting or bleeding from your vagina.  You have severe abdominal cramping or pain.  You have rapid weight gain or weight loss.  You have shortness of breath with chest pain.  You notice sudden or extreme swelling of your face, hands, ankles, feet, or legs.  You have not felt your baby move in over an hour.  You have severe headaches that do not go away when you take medicine.  You have vision changes. Summary  The second trimester is from week 14 through week 27 (months 4 through 6). It is also a time when the fetus is growing rapidly.  Your body goes through many changes during pregnancy. The changes vary from woman to woman.  Avoid all smoking, herbs, alcohol, and unprescribed drugs. These chemicals affect the formation and growth your baby.  Do not use any tobacco products, such as cigarettes, chewing tobacco, and e-cigarettes. If you  need help quitting, ask your health care provider.  Contact your health care provider if you have any questions. Keep all prenatal visits as told by your health care provider. This is important. This information is not intended to replace advice given to you by your health care provider. Make sure you discuss any questions you have with your health care provider. Document Released: 07/20/2001 Document Revised: 01/01/2016 Document Reviewed: 09/26/2012 Elsevier Interactive Patient Education  2017 [place] FLU! Because you are pregnant, we at [facility], along with the Centers for Disease Control (CDC), recommend that you receive the flu vaccine to protect yourself and your baby from the flu. The flu is more likely to cause severe illness in pregnant women than in women of reproductive age who are not pregnant. Changes in the immune system, heart, and lungs during pregnancy make pregnant women (and women up to two weeks postpartum) more prone to severe illness from flu, including illness resulting in hospitalization. Flu also may be harmful for a pregnant woman's developing baby. A common flu symptom is fever, which may be associated with neural tube defects and other adverse outcomes for a developing baby. Getting vaccinated can also help protect a baby after birth from flu. (Mom passes antibodies onto the developing baby during her pregnancy.)  A Flu Vaccine is the Best Protection Against Flu Getting a flu vaccine is the first and most important step in protecting against flu. Pregnant women should get a flu shot and not the live attenuated influenza vaccine (LAIV), also known as nasal spray flu vaccine. Flu vaccines given during pregnancy help protect both the mother and her baby from flu. Vaccination has been shown to reduce the risk of flu-associated acute respiratory infection in pregnant women by up to one-half. A 2018 study showed that getting a flu shot  reduced a pregnant woman's risk of being hospitalized with flu by an average of 40 percent. Pregnant women who get a flu  vaccine are also helping to protect their babies from flu illness for the first several months after their birth, when they are too young to get vaccinated.   A Long Record of Safety for Flu Shots in Pregnant Women Flu shots have been given to millions of pregnant women over many years with a good safety record. There is a lot of evidence that flu vaccines can be given safely during pregnancy; though these data are limited for the first trimester. The CDC recommends that pregnant women get vaccinated during any trimester of their pregnancy. It is very important for pregnant women to get the flu shot.   Other Preventive Actions In addition to getting a flu shot, pregnant women should take the same everyday preventive actions the CDC recommends of everyone, including covering coughs, washing hands often, and avoiding people who are sick.  Symptoms and Treatment If you get sick with flu symptoms call your doctor right away. There are antiviral drugs that can treat flu illness and prevent serious flu complications. The CDC recommends prompt treatment for people who have influenza infection or suspected influenza infection and who are at high risk of serious flu complications, such as people with asthma, diabetes (including gestational diabetes), or heart disease. Early treatment of influenza in hospitalized pregnant women has been shown to reduce the length of the hospital stay.  Symptoms Flu symptoms include fever, cough, sore throat, runny or stuffy nose, body aches, headache, chills and fatigue. Some people may also have vomiting and diarrhea. People may be infected with the flu and have respiratory symptoms without a fever.  Early Treatment is Important for Pregnant Women Treatment should begin as soon as possible because antiviral drugs work best when started early (within 48 hours  after symptoms start). Antiviral drugs can make your flu illness milder and make you feel better faster. They may also prevent serious health problems that can result from flu illness. Oral oseltamivir (Tamiflu) is the preferred treatment for pregnant women because it has the most studies available to suggest that it is safe and beneficial. Antiviral drugs require a prescription from your provider. Having a fever caused by flu infection or other infections early in pregnancy may be linked to birth defects in a baby. In addition to taking antiviral drugs, pregnant women who get a fever should treat their fever with Tylenol (acetaminophen) and contact their provider immediately.  When to [place] If you are pregnant and have any of these signs, seek care immediately:  Difficulty breathing or shortness of breath  Pain or pressure in the chest or abdomen  Sudden dizziness  Confusion  Severe or persistent vomiting  High fever that is not responding to Tylenol (or store brand equivalent)  Decreased or no movement of your baby  [employer].it.htm

## 2020-02-27 ENCOUNTER — Encounter: Payer: Self-pay | Admitting: Women's Health

## 2020-02-27 DIAGNOSIS — R7989 Other specified abnormal findings of blood chemistry: Secondary | ICD-10-CM | POA: Insufficient documentation

## 2020-02-27 LAB — CBC
Hematocrit: 40.7 % (ref 34.0–46.6)
Hemoglobin: 13.2 g/dL (ref 11.1–15.9)
MCH: 28 pg (ref 26.6–33.0)
MCHC: 32.4 g/dL (ref 31.5–35.7)
MCV: 86 fL (ref 79–97)
Platelets: 224 10*3/uL (ref 150–450)
RBC: 4.72 x10E6/uL (ref 3.77–5.28)
RDW: 13.7 % (ref 11.7–15.4)
WBC: 8.2 10*3/uL (ref 3.4–10.8)

## 2020-02-27 LAB — TSH: TSH: 2.59 u[IU]/mL (ref 0.450–4.500)

## 2020-02-27 LAB — VITAMIN D 25 HYDROXY (VIT D DEFICIENCY, FRACTURES): Vit D, 25-Hydroxy: 21.7 ng/mL — ABNORMAL LOW (ref 30.0–100.0)

## 2020-02-28 LAB — INTEGRATED 2
AFP MoM: 1.52
Alpha-Fetoprotein: 30.2 ng/mL
Crown Rump Length: 60.6 mm
DIA MoM: 1.48
DIA Value: 187.7 pg/mL
Estriol, Unconjugated: 0.63 ng/mL
Gest. Age on Collection Date: 12.3 weeks
Gestational Age: 15.3 weeks
Maternal Age at EDD: 25.6 yr
Nuchal Translucency (NT): 2.3 mm
Nuchal Translucency MoM: 1.7
Number of Fetuses: 1
PAPP-A MoM: 1.16
PAPP-A Value: 604.3 ng/mL
Test Results:: NEGATIVE
Weight: 238 [lb_av]
Weight: 241 [lb_av]
hCG MoM: 1.51
hCG Value: 46.4 IU/mL
uE3 MoM: 0.92

## 2020-02-28 LAB — GC/CHLAMYDIA PROBE AMP
Chlamydia trachomatis, NAA: NEGATIVE
Neisseria Gonorrhoeae by PCR: NEGATIVE

## 2020-03-10 ENCOUNTER — Telehealth: Payer: Self-pay | Admitting: Women's Health

## 2020-03-10 ENCOUNTER — Encounter: Payer: Self-pay | Admitting: *Deleted

## 2020-03-10 DIAGNOSIS — Z3480 Encounter for supervision of other normal pregnancy, unspecified trimester: Secondary | ICD-10-CM

## 2020-03-10 NOTE — Telephone Encounter (Signed)
Patient wants to update pharmacy information and wants to see if the meds that were sent to [facility] can be changed to new pharmacy

## 2020-03-25 ENCOUNTER — Other Ambulatory Visit: Payer: Medicaid Other

## 2020-03-25 ENCOUNTER — Encounter: Payer: Medicaid Other | Admitting: Women's Health

## 2020-04-04 ENCOUNTER — Ambulatory Visit (INDEPENDENT_AMBULATORY_CARE_PROVIDER_SITE_OTHER): Payer: Medicaid Other | Admitting: Obstetrics & Gynecology

## 2020-04-04 ENCOUNTER — Encounter: Payer: Self-pay | Admitting: Obstetrics & Gynecology

## 2020-04-04 ENCOUNTER — Ambulatory Visit (INDEPENDENT_AMBULATORY_CARE_PROVIDER_SITE_OTHER): Payer: Medicaid Other

## 2020-04-04 VITALS — BP 132/84 | HR 117 | Wt 243.4 lb

## 2020-04-04 DIAGNOSIS — Z1389 Encounter for screening for other disorder: Secondary | ICD-10-CM

## 2020-04-04 DIAGNOSIS — Z3482 Encounter for supervision of other normal pregnancy, second trimester: Secondary | ICD-10-CM

## 2020-04-04 DIAGNOSIS — Z331 Pregnant state, incidental: Secondary | ICD-10-CM | POA: Diagnosis not present

## 2020-04-04 DIAGNOSIS — Z3A21 21 weeks gestation of pregnancy: Secondary | ICD-10-CM | POA: Diagnosis not present

## 2020-04-04 DIAGNOSIS — Z3480 Encounter for supervision of other normal pregnancy, unspecified trimester: Secondary | ICD-10-CM

## 2020-04-04 DIAGNOSIS — Z363 Encounter for antenatal screening for malformations: Secondary | ICD-10-CM | POA: Diagnosis not present

## 2020-04-04 LAB — POCT URINALYSIS DIPSTICK OB
Blood, UA: NEGATIVE
Glucose, UA: NEGATIVE
Ketones, UA: NEGATIVE
Leukocytes, UA: NEGATIVE
Nitrite, UA: NEGATIVE
POC,PROTEIN,UA: NEGATIVE

## 2020-04-04 NOTE — Progress Notes (Signed)
[place] 21+1 wks,cephalic,cx 4.5 cm,posterior placenta gr 0,svp of fluid 3.2 cm,normal ovaries,fhr 157 bpm,EFW 397 g 40%,anatomy complete,no obvious abnormalities

## 2020-04-04 NOTE — Progress Notes (Signed)
LOW-RISK PREGNANCY VISIT Patient name: Melanie Russo MRN [id number]  Date of birth: Sep 01, 1995 Chief Complaint:   Routine Prenatal Visit (anatomy)  History of Present Illness:   Luann Aspinwall is a 24 y.o. G46P1001 female at [redacted]w[redacted]d with an Estimated Date of Delivery: 08/14/20 being seen today for ongoing management of a low-risk pregnancy.  Depression screen [facility] 2/9 02/05/2020 12/14/2019  Decreased Interest 1 2  Down, Depressed, Hopeless 1 0  PHQ - 2 Score 2 2  Altered sleeping 3 3  Tired, decreased energy 3 3  Change in appetite 3 1  Feeling bad or failure about yourself  1 0  Trouble concentrating 1 2  Moving slowly or fidgety/restless 0 0  Suicidal thoughts 0 0  PHQ-9 Score 13 11  Difficult doing work/chores - Not difficult at all    Today she reports fatigue, back ache, pelvic pressure. Contractions: Not present. Vag. Bleeding: None.  Movement: Present. denies leaking of fluid. Review of Systems:   Pertinent items are noted in HPI Denies abnormal vaginal discharge w/ itching/odor/irritation, headaches, visual changes, shortness of breath, chest pain, abdominal pain, severe nausea/vomiting, or problems with urination or bowel movements unless otherwise stated above. Pertinent History Reviewed:  Reviewed past medical,surgical, social, obstetrical and family history.  Reviewed problem list, medications and allergies. Physical Assessment:   Vitals:   04/04/20 1223  BP: 132/84  Pulse: (!) 117  Weight: 243 lb 6.4 oz (110.4 kg)  Body mass index is 40.82 kg/m.        Physical Examination:   General appearance: Well appearing, and in no distress  Mental status: Alert, oriented to person, place, and time  Skin: Warm & dry  Cardiovascular: Normal heart rate noted  Respiratory: Normal respiratory effort, no distress  Abdomen: Soft, gravid, nontender  Pelvic: Cervical exam deferred         Extremities: Edema: None  Fetal Status:     Movement: Present    Chaperone: n/a     Results for orders placed or performed in visit on 04/04/20 (from the past 24 hour(s))  POC Urinalysis Dipstick OB   Collection Time: 04/04/20 12:26 PM  Result Value Ref Range   Color, UA     Clarity, UA     Glucose, UA Negative Negative   Bilirubin, UA     Ketones, UA neg    Spec Grav, UA     Blood, UA neg    pH, UA     POC,PROTEIN,UA Negative Negative, Trace, Small (1+), Moderate (2+), Large (3+), 4+   Urobilinogen, UA     Nitrite, UA neg    Leukocytes, UA Negative Negative   Appearance     Odor      Assessment & Plan:  1) Low-risk pregnancy G2P1001 at [redacted]w[redacted]d with an Estimated Date of Delivery: 08/14/20   2) , recommend prenatal    Meds: No orders of the defined types were placed in this encounter.  Labs/procedures today: sonogram is normal  Plan:  Continue routine obstetrical care  Next visit: prefers in person    Reviewed:Pre Term labor symptoms and general obstetric precautions including but not limited to vaginal bleeding, contractions, leaking of fluid and fetal movement were reviewed in detail with the patient.  All questions were answered. Has home bp cuff. Rx faxed to . Check bp weekly, let us know if >140/90.   Follow-up: No follow-ups on file.  Orders Placed This Encounter  Procedures   POC Urinalysis Dipstick OB   Lazaro Arms  04/04/2020 12:40 PM

## 2020-04-18 DIAGNOSIS — O2310 Infections of bladder in pregnancy, unspecified trimester: Secondary | ICD-10-CM | POA: Diagnosis not present

## 2020-04-18 DIAGNOSIS — O99891 Other specified diseases and conditions complicating pregnancy: Secondary | ICD-10-CM | POA: Diagnosis not present

## 2020-04-18 DIAGNOSIS — Z8759 Personal history of other complications of pregnancy, childbirth and the puerperium: Secondary | ICD-10-CM | POA: Diagnosis not present

## 2020-04-18 DIAGNOSIS — Z3A23 23 weeks gestation of pregnancy: Secondary | ICD-10-CM | POA: Diagnosis not present

## 2020-04-18 DIAGNOSIS — Z3A00 Weeks of gestation of pregnancy not specified: Secondary | ICD-10-CM | POA: Diagnosis not present

## 2020-04-18 DIAGNOSIS — R9431 Abnormal electrocardiogram [ECG] [EKG]: Secondary | ICD-10-CM | POA: Diagnosis not present

## 2020-04-18 DIAGNOSIS — R825 Elevated urine levels of drugs, medicaments and biological substances: Secondary | ICD-10-CM | POA: Diagnosis not present

## 2020-04-18 DIAGNOSIS — O2312 Infections of bladder in pregnancy, second trimester: Secondary | ICD-10-CM | POA: Diagnosis not present

## 2020-04-19 ENCOUNTER — Encounter (HOSPITAL_COMMUNITY): Payer: Self-pay | Admitting: Obstetrics and Gynecology

## 2020-04-19 ENCOUNTER — Other Ambulatory Visit: Payer: Self-pay

## 2020-04-19 ENCOUNTER — Inpatient Hospital Stay (HOSPITAL_COMMUNITY)
Admission: AD | Admit: 2020-04-19 | Discharge: 2020-04-19 | Disposition: A | Discharge status: Home / Self Care | Payer: Medicaid Other | Attending: Obstetrics and Gynecology | Admitting: Obstetrics and Gynecology

## 2020-04-19 DIAGNOSIS — Z3A23 23 weeks gestation of pregnancy: Secondary | ICD-10-CM | POA: Diagnosis not present

## 2020-04-19 DIAGNOSIS — Z3480 Encounter for supervision of other normal pregnancy, unspecified trimester: Secondary | ICD-10-CM

## 2020-04-19 DIAGNOSIS — R102 Pelvic and perineal pain: Secondary | ICD-10-CM | POA: Diagnosis not present

## 2020-04-19 DIAGNOSIS — R519 Headache, unspecified: Secondary | ICD-10-CM

## 2020-04-19 DIAGNOSIS — O26892 Other specified pregnancy related conditions, second trimester: Secondary | ICD-10-CM

## 2020-04-19 LAB — URINALYSIS, ROUTINE W REFLEX MICROSCOPIC
Bilirubin Urine: NEGATIVE
Glucose, UA: NEGATIVE mg/dL
Hgb urine dipstick: NEGATIVE
Ketones, ur: 80 mg/dL — AB
Leukocytes,Ua: NEGATIVE
Nitrite: NEGATIVE
Protein, ur: NEGATIVE mg/dL
Specific Gravity, Urine: 1.02 (ref 1.005–1.030)
pH: 6 (ref 5.0–8.0)

## 2020-04-19 MED ORDER — BUTALBITAL-APAP-CAFFEINE 50-325-40 MG PO TABS
2.0000 | ORAL_TABLET | Freq: Once | ORAL | Status: AC
  Administered 2020-04-19: 2 via ORAL
  Filled 2020-04-19: qty 2

## 2020-04-19 NOTE — Discharge Instructions (Signed)
Perinatal Anxiety When a woman feels excessive tension or worry (anxiety) during pregnancy or during the first 12 months after she gives birth, she has a condition called perinatal anxiety. Anxiety can interfere with work, school, relationships, and other everyday activities. If it is not managed properly, it can also cause problems in the mother and her baby.  If you are pregnant and you have symptoms of an anxiety disorder, it is important to talk with your health care provider. What are the causes? The exact cause of this condition is not known. Hormonal changes during and after pregnancy may play a role in causing perinatal anxiety. What increases the risk? You are more likely to develop this condition if:  You have a personal or family history of depression, anxiety, or mood disorders.  You experience a stressful life event during pregnancy, such as the death of a loved one.  You have a lot of regular life stress, such as being a single parent.  You have thyroid problems. What are the signs or symptoms? Perinatal anxiety can be different for everyone. It may include:  Panic attacks (panic disorder). These are intense episodes of fear or discomfort that may also cause sweating, nausea, shortness of breath, or fear of dying. They usually last 5-15 minutes.  Reliving an upsetting (traumatic) event through distressing thoughts, dreams, or flashbacks (post-traumatic stress disorder, or PTSD).  Excessive worry about multiple problems (generalized anxiety disorder).  Fear and stress about leaving certain people or loved ones (separation anxiety).  Performing repetitive tasks (compulsions) to relieve stress or worry (obsessive compulsive disorder, or OCD).  Fear of certain objects or situations (phobias).  Excessive worrying, such as a constant feeling that something bad is going to happen.  Inability to relax.  Difficulty concentrating.  Sleep problems.  Frequent nightmares or  disturbing thoughts. How is this diagnosed? This condition is diagnosed based on a physical exam and mental evaluation. In some cases, your health care provider may use an anxiety screening tool. These tools include a list of questions that can help a health care provider diagnose anxiety. Your health care provider may refer you to a mental health expert who specializes in anxiety. How is this treated? This condition may be treated with:  Medicines. Your health care provider will only give you medicines that have been proven safe for pregnancy and breastfeeding.  Talk therapy with a mental health professional to help change your patterns of thinking (cognitive behavioral therapy).  Mindfulness-based stress reduction.  Other relaxation therapies, such as deep breathing or guided muscle relaxation.  Support groups. Follow these instructions at home: Lifestyle  Do not use any products that contain nicotine or tobacco, such as cigarettes and e-cigarettes. If you need help quitting, ask your health care provider.  Do not use alcohol when you are pregnant. After your baby is born, limit alcohol intake to no more than 1 drink a day. One drink equals 12 oz of beer, 5 oz of wine, or 1 oz of hard liquor.  Consider joining a support group for new mothers. Ask your health care provider for recommendations.  Take good care of yourself. Make sure you: ? Get plenty of sleep. If you are having trouble sleeping, talk with your health care provider. ? Eat a healthy diet. This includes plenty of fruits and vegetables, whole grains, and lean proteins. ? Exercise regularly, as told by your health care provider. Ask your health care provider what exercises are safe for you. General instructions  Take over-the-counter   and prescription medicines only as told by your health care provider.  Talk with your partner or family members about your feelings during pregnancy. Share any concerns or fears that you may  have.  Ask for help with tasks or chores when you need it. Ask friends and family members to provide meals, watch your children, or help with cleaning.  Keep all follow-up visits as told by your health care provider. This is important. Contact a health care provider if:  You (or people close to you) notice that you have any symptoms of anxiety or depression.  You have anxiety and your symptoms get worse.  You experience side effects from medicines, such as nausea or sleep problems. Get help right away if:  You feel like hurting yourself, your baby, or someone else. If you ever feel like you may hurt yourself or others, or have thoughts about taking your own life, get help right away. You can go to your nearest emergency department or call:  Your local emergency services (911 in the [place]  A suicide crisis helpline, such as the National Suicide Prevention Lifeline at 1-800-273-8255. This is open 24 hours a day. Summary  Perinatal anxiety is when a woman feels excessive tension or worry during pregnancy or during the first 12 months after she gives birth.  Perinatal anxiety may include panic attacks, post-traumatic stress disorder, separation anxiety, phobias, or generalized anxiety.  Perinatal anxiety can cause physical health problems in the mother and baby if not properly managed.  This condition is treated with medicines, talk therapy, stress reduction therapies, or a combination of two or more treatments.  Talk with your partner or family members about your concerns or fears. Do not be afraid to ask for help. This information is not intended to replace advice given to you by your health care provider. Make sure you discuss any questions you have with your health care provider. Document Revised: 07/29/2017 Document Reviewed: 09/22/2016 Elsevier Patient Education  2020 Elsevier Inc.  

## 2020-04-19 NOTE — MAU Note (Signed)
PT SAYS SHE WENT TO [facility] TONIGHT - FOR BP 144/99..   AT HOSPITAL- 140/79.   F[facility]- [facility].   THEN AT HOME 162/98.  FEELS - WALKING - SOB- STARTED TODAY- WITH H/A . H/A X5 DAYS - HAS HX H/A - TAKES TYLENOL- TODAY TYLENOL DIDN'T HELP.

## 2020-04-19 NOTE — MAU Provider Note (Signed)
History     CSN: [id number]  Arrival date and time: 04/19/20 0153   First Provider Initiated Contact with Patient 04/19/20 0328      Chief Complaint  Patient presents with  . Headache   Melanie Russo is a 24 y.o. G2P1001 at [redacted]w[redacted]d who receives care at [facility].  She presents today for Headache.  She states the headache started about 5 days ago.  She reports the headache has been intermittent, but unresponsive to tylenol.  She reports her last dose was this morning.  She states the headache is on her right side and "sits behind my eye and radiates down to my jaw."  Patient rates the pain a 3/10 and states it has improved with dimming the lights.  Patient endorses fetal movement and denies abdominal cramping or contractions.  She does report some intermittent pelvic pain.    Patient reports she was seen at [facility], tonight, for her headache and had a terrible experience.  Patient states the provider accused her of drug seeking and stated her drug screen returned positive for oxycodone.  Patient adamantly states that she does not use drugs currently or in the past.  Patient also states she was told that she has a UTI, but does not have any symptoms.    OB History    Gravida  2   Para  1   Term  1   Preterm      AB      Living  1     SAB      TAB      Ectopic      Multiple      Live Births  1           Past Medical History:  Diagnosis Date  . Arthritis   . Hypertension    gestational   . Sjogren's syndrome [facility])     Past Surgical History:  Procedure Laterality Date  . NO PAST SURGERIES      Family History  Problem Relation Age of Onset  . Hypertension Father   . Arthritis Father   . Obesity Maternal Uncle   . Cancer Maternal Grandmother        brain  . Heart disease Paternal Grandmother   . Hypertension Paternal Grandmother   . Thyroid disease Paternal Grandmother   . Thyroid disease Paternal Grandfather     Social History   Tobacco  Use  . Smoking status: Never Smoker  . Smokeless tobacco: Never Used  Vaping Use  . Vaping Use: Never used  Substance Use Topics  . Alcohol use: Yes    Comment: rarely  . Drug use: Never    Allergies:  Allergies  Allergen Reactions  . Kiwi Extract Swelling  . Latex   . Other Swelling    Blueberries, blackberries, raspberries  . Penicillins     Medications Prior to Admission  Medication Sig Dispense Refill Last Dose  . aspirin 81 MG chewable tablet Chew 162 mg by mouth daily.   04/18/2020 at Unknown time  . pantoprazole (PROTONIX) 20 MG tablet Take 1 tablet (20 mg total) by mouth daily. 30 tablet 6 Past Week at Unknown time  . Prenatal Vit-Fe Fumarate-FA (MULTIVITAMIN-PRENATAL) 27-0.8 MG TABS tablet Take 1 tablet by mouth daily at 12 noon.   Past Week at Unknown time  . Blood Pressure Monitor MISC For regular home bp monitoring during pregnancy 1 each 0     Review of Systems  Constitutional: Negative for  chills and fever.  Respiratory: Negative for cough and shortness of breath.   Gastrointestinal: Negative for abdominal pain.  Genitourinary: Positive for pelvic pain. Negative for difficulty urinating, dysuria, vaginal bleeding and vaginal discharge.  Musculoskeletal: Negative for back pain.  Neurological: Positive for headaches. Negative for dizziness and light-headedness.   Physical Exam   Blood pressure 123/75, pulse (!) 126, temperature 98.3 F (36.8 C), temperature source Oral, resp. rate 20, height 5' 5.75" (1.67 m), weight 111.7 kg, last menstrual period 11/08/2019.  Physical Exam Vitals reviewed.  Constitutional:      Appearance: She is well-developed.  HENT:     Head: Normocephalic and atraumatic.  Eyes:     Conjunctiva/sclera: Conjunctivae normal.  Cardiovascular:     Rate and Rhythm: Normal rate and regular rhythm.     Heart sounds: Normal heart sounds.  Pulmonary:     Effort: Pulmonary effort is normal. No respiratory distress.     Breath sounds:  Normal breath sounds.  Abdominal:     General: Bowel sounds are normal. There is no distension.     Palpations: Abdomen is soft.     Tenderness: There is no abdominal tenderness.  Musculoskeletal:        General: Normal range of motion.     Cervical back: Normal range of motion.  Skin:    General: Skin is warm and dry.  Neurological:     Mental Status: She is alert and oriented to person, place, and time.  Psychiatric:        Mood and Affect: Mood normal.        Speech: Speech normal.        Behavior: Behavior normal.     Fetal Assessment 145 bpm, Mod Var, -Decels, +Accels Toco: No Ctx graphed  MAU Course   Results for orders placed or performed during the hospital encounter of 04/19/20 (from the past 24 hour(s))  Urinalysis, Routine w reflex microscopic Urine, Clean Catch     Status: Abnormal   Collection Time: 04/19/20  2:24 AM  Result Value Ref Range   Color, Urine YELLOW YELLOW   APPearance HAZY (A) CLEAR   Specific Gravity, Urine 1.020 1.005 - 1.030   pH 6.0 5.0 - 8.0   Glucose, UA NEGATIVE NEGATIVE mg/dL   Hgb urine dipstick NEGATIVE NEGATIVE   Bilirubin Urine NEGATIVE NEGATIVE   Ketones, ur 80 (A) NEGATIVE mg/dL   Protein, ur NEGATIVE NEGATIVE mg/dL   Nitrite NEGATIVE NEGATIVE   Leukocytes,Ua NEGATIVE NEGATIVE   No results found.  MDM PE Labs:UA, UDS EFM  Assessment and Plan  24 year old G2P1001  SIUP at 23.2weeks Cat I FT Headache  -POC reviewed.  -Exam performed. -Labs from [facility] reviewed and discussed with patient. -Informed that lab result could have been a false positive or mislabeled urine.  -Further informed that UA tonight not reflective of infection and instructed to not take antibiotics.  -Patient given option for repeat UDS tonight and agrees. -Will give fioricet for headache and reassess.   Cherre Robins MSN, CNM 04/19/2020, 3:28 AM   Reassessment (4:30 AM)  -Patient reports HA has resolved. -UDS pending. -Nurse instructed to  inform patient that results will be released to mychart for review. -Encouraged to call or return to MAU if symptoms worsen or with the onset of new symptoms. -Discharged to home in improved condition.  Cherre Robins MSN, CNM Advanced Practice Provider, [facility] [employer]

## 2020-04-21 ENCOUNTER — Telehealth: Payer: Self-pay | Admitting: Women's Health

## 2020-04-21 NOTE — Telephone Encounter (Addendum)
Pt went to [facility] with high BP on 04/18/20. Pt was + for Oxycodone. Pt has "never taken this med before". Pt went to [facility] on 04/19/20. Pt takes Tylenol prn, Prenatal, Protonix and Aspirin. Pt is concerned over the + drug screen. Pt did request a drug screen at [facility]. Results are not back yet. I reassured pt as best as I could. Advised to keep checking BP at home. Watch for headache or blurred vision. Keep self hydrated. Pt voiced understanding. JSY

## 2020-04-21 NOTE — Telephone Encounter (Signed)
Pt is coming here for OB  Care/ had gone to [facility] for her BP to er and has some concerned because her drug screen come back for oxy and she has never taken it and she is upset and has had a awful weekend and she is crying wants to talk to someone about this. She went to [facility] on the 11th because she was so upset and they did a drug test as well

## 2020-04-30 DIAGNOSIS — F411 Generalized anxiety disorder: Secondary | ICD-10-CM | POA: Diagnosis not present

## 2020-05-01 ENCOUNTER — Other Ambulatory Visit: Payer: Self-pay

## 2020-05-01 ENCOUNTER — Encounter (HOSPITAL_COMMUNITY): Payer: Self-pay | Admitting: Obstetrics and Gynecology

## 2020-05-01 ENCOUNTER — Inpatient Hospital Stay (HOSPITAL_COMMUNITY)
Admission: AD | Admit: 2020-05-01 | Discharge: 2020-05-01 | Disposition: A | Discharge status: Home / Self Care | Payer: Medicaid Other | Attending: Obstetrics and Gynecology | Admitting: Obstetrics and Gynecology

## 2020-05-01 DIAGNOSIS — R109 Unspecified abdominal pain: Secondary | ICD-10-CM

## 2020-05-01 DIAGNOSIS — N949 Unspecified condition associated with female genital organs and menstrual cycle: Secondary | ICD-10-CM

## 2020-05-01 DIAGNOSIS — F329 Major depressive disorder, single episode, unspecified: Secondary | ICD-10-CM | POA: Insufficient documentation

## 2020-05-01 DIAGNOSIS — O132 Gestational [pregnancy-induced] hypertension without significant proteinuria, second trimester: Secondary | ICD-10-CM | POA: Insufficient documentation

## 2020-05-01 DIAGNOSIS — Z3A25 25 weeks gestation of pregnancy: Secondary | ICD-10-CM | POA: Insufficient documentation

## 2020-05-01 DIAGNOSIS — Z3689 Encounter for other specified antenatal screening: Secondary | ICD-10-CM

## 2020-05-01 DIAGNOSIS — F419 Anxiety disorder, unspecified: Secondary | ICD-10-CM | POA: Diagnosis not present

## 2020-05-01 DIAGNOSIS — Z7982 Long term (current) use of aspirin: Secondary | ICD-10-CM | POA: Diagnosis not present

## 2020-05-01 DIAGNOSIS — O26892 Other specified pregnancy related conditions, second trimester: Secondary | ICD-10-CM | POA: Insufficient documentation

## 2020-05-01 DIAGNOSIS — O99342 Other mental disorders complicating pregnancy, second trimester: Secondary | ICD-10-CM | POA: Insufficient documentation

## 2020-05-01 DIAGNOSIS — M3500 Sicca syndrome, unspecified: Secondary | ICD-10-CM | POA: Insufficient documentation

## 2020-05-01 DIAGNOSIS — R102 Pelvic and perineal pain: Secondary | ICD-10-CM | POA: Diagnosis not present

## 2020-05-01 LAB — URINALYSIS, ROUTINE W REFLEX MICROSCOPIC
Bilirubin Urine: NEGATIVE
Glucose, UA: NEGATIVE mg/dL
Hgb urine dipstick: NEGATIVE
Ketones, ur: 40 mg/dL — AB
Leukocytes,Ua: NEGATIVE
Nitrite: NEGATIVE
Protein, ur: NEGATIVE mg/dL
Specific Gravity, Urine: 1.02 (ref 1.005–1.030)
pH: 7 (ref 5.0–8.0)

## 2020-05-01 NOTE — Discharge Instructions (Signed)
Round Ligament Pain  The round ligament is a cord of muscle and tissue that helps support the uterus. It can become a source of pain during pregnancy if it becomes stretched or twisted as the baby grows. The pain usually begins in the second trimester (13-28 weeks) of pregnancy, and it can come and go until the baby is delivered. It is not a serious problem, and it does not cause harm to the baby. Round ligament pain is usually a short, sharp, and pinching pain, but it can also be a dull, lingering, and aching pain. The pain is felt in the lower side of the abdomen or in the groin. It usually starts deep in the groin and moves up to the outside of the hip area. The pain may occur when you:  Suddenly change position, such as quickly going from a sitting to standing position.  Roll over in bed.  Cough or sneeze.  Do physical activity. Follow these instructions at home:   Watch your condition for any changes.  When the pain starts, relax. Then try any of these methods to help with the pain: ? Sitting down. ? Flexing your knees up to your abdomen. ? Lying on your side with one pillow under your abdomen and another pillow between your legs. ? Sitting in a warm bath for 15-20 minutes or until the pain goes away.  Take over-the-counter and prescription medicines only as told by your health care provider.  Move slowly when you sit down or stand up.  Avoid long walks if they cause pain.  Stop or reduce your physical activities if they cause pain.  Keep all follow-up visits as told by your health care provider. This is important. Contact a health care provider if:  Your pain does not go away with treatment.  You feel pain in your back that you did not have before.  Your medicine is not helping. Get help right away if:  You have a fever or chills.  You develop uterine contractions.  You have vaginal bleeding.  You have nausea or vomiting.  You have diarrhea.  You have pain  when you urinate. Summary  Round ligament pain is felt in the lower abdomen or groin. It is usually a short, sharp, and pinching pain. It can also be a dull, lingering, and aching pain.  This pain usually begins in the second trimester (13-28 weeks). It occurs because the uterus is stretching with the growing baby, and it is not harmful to the baby.  You may notice the pain when you suddenly change position, when you cough or sneeze, or during physical activity.  Relaxing, flexing your knees to your abdomen, lying on one side, or taking a warm bath may help to get rid of the pain.  Get help from your health care provider if the pain does not go away or if you have vaginal bleeding, nausea, vomiting, diarrhea, or painful urination. This information is not intended to replace advice given to you by your health care provider. Make sure you discuss any questions you have with your health care provider. Document Revised: 01/11/2018 Document Reviewed: 01/11/2018 Elsevier Patient Education  2020 [employer].        Preterm Labor and Birth Information  The normal length of a pregnancy is 39-41 weeks. Preterm labor is when labor starts before 37 completed weeks of pregnancy. What are the risk factors for preterm labor? Preterm labor is more likely to occur in women who:  Have certain infections  during pregnancy such as a bladder infection, sexually transmitted infection, or infection inside the uterus (chorioamnionitis).  Have a shorter-than-normal cervix.  Have gone into preterm labor before.  Have had surgery on their cervix.  Are younger than age 73 or older than age 61.  Are African American.  Are pregnant with twins or multiple babies (multiple gestation).  Take street drugs or smoke while pregnant.  Do not gain enough weight while pregnant.  Became pregnant shortly after having been pregnant. What are the symptoms of preterm labor? Symptoms of preterm labor  include:  Cramps similar to those that can happen during a menstrual period. The cramps may happen with diarrhea.  Pain in the abdomen or lower back.  Regular uterine contractions that may feel like tightening of the abdomen.  A feeling of increased pressure in the pelvis.  Increased watery or bloody mucus discharge from the vagina.  Water breaking (ruptured amniotic sac). Why is it important to recognize signs of preterm labor? It is important to recognize signs of preterm labor because babies who are born prematurely may not be fully developed. This can put them at an increased risk for:  Long-term (chronic) heart and lung problems.  Difficulty immediately after birth with regulating body systems, including blood sugar, body temperature, heart rate, and breathing rate.  Bleeding in the brain.  Cerebral palsy.  Learning difficulties.  Death. These risks are highest for babies who are born before 34 weeks of pregnancy. How is preterm labor treated? Treatment depends on the length of your pregnancy, your condition, and the health of your baby. It may involve:  Having a stitch (suture) placed in your cervix to prevent your cervix from opening too early (cerclage).  Taking or being given medicines, such as: ? Hormone medicines. These may be given early in pregnancy to help support the pregnancy. ? Medicine to stop contractions. ? Medicines to help mature the babys lungs. These may be prescribed if the risk of delivery is high. ? Medicines to prevent your baby from developing cerebral palsy. If the labor happens before 34 weeks of pregnancy, you may need to stay in the hospital. What should I do if I think I am in preterm labor? If you think that you are going into preterm labor, call your health care provider right away. How can I prevent preterm labor in future pregnancies? To increase your chance of having a full-term pregnancy:  Do not use any tobacco products, such as  cigarettes, chewing tobacco, and e-cigarettes. If you need help quitting, ask your health care provider.  Do not use street drugs or medicines that have not been prescribed to you during your pregnancy.  Talk with your health care provider before taking any herbal supplements, even if you have been taking them regularly.  Make sure you gain a healthy amount of weight during your pregnancy.  Watch for infection. If you think that you might have an infection, get it checked right away.  Make sure to tell your health care provider if you have gone into preterm labor before. This information is not intended to replace advice given to you by your health care provider. Make sure you discuss any questions you have with your health care provider. Document Revised: 11/17/2018 Document Reviewed: 12/17/2015 Elsevier Patient Education  2020 [employer].

## 2020-05-01 NOTE — MAU Note (Signed)
I picked up a large pumpkin about 1530  and caused pain in lower abd which continues. Pain is sharp and constant. Sometimes worsens with my position. Denies LOF of VB. Baby not moving as much since 1500. Drank coffee and did not help with FM

## 2020-05-01 NOTE — MAU Provider Note (Signed)
History     CSN: [id number]  Arrival date and time: 05/01/20 6283   First Provider Initiated Contact with Patient 05/01/20 2032      Chief Complaint  Patient presents with  . Abdominal Pain  . Decreased Fetal Movement   Ms. Melanie Russo is a 24 y.o. G2P1001 at [redacted]w[redacted]d who presents to MAU for abdominal pain after listing a pumpkin around 330PM this afternoon. Patient reports when she lifted the pumpkin she felt a "sharp pulling" on either side of her pelvis and lower abdomen. Patient reports the sensation has continued until now. Patient reports the pain is constantly present, but does wax and wane. Patient reports sitting still on an incline is best, lying on her side is more painful. Walking makes it worse and getting up from a sitting to standing position also makes it worse. Patient reports she has not tried taking any medication for pain at home. Patient reports pain is 2/10 at this time.  Patient reports normal fetal movement. Patient states she does not have an anterior placenta with this pregnancy so she feels this one move much more than her previous pregnancy.  Pt denies VB, LOF, ctx, decreased FM, vaginal discharge/odor/itching. Pt denies N/V, constipation, diarrhea, or urinary problems. Pt denies fever, chills, fatigue, sweating or changes in appetite. Pt denies SOB or chest pain. Pt denies dizziness, HA, light-headedness, weakness.  Problems this pregnancy include: hx gHTN/pre-E, Sjogren's Syndrome, depression, anxiety, hx PPD, ?cHTN?Marland Kitchen Allergies? Kiwi, latex, berries, PCN Current medications/supplements? Protonix, low dose ASA [facility]ren[facility] care provider? [facility], next appt 05/05/2020   OB History    Gravida  2   Para  1   Term  1   Preterm      AB      Living  1     SAB      TAB      Ectopic      Multiple      Live Births  1           Past Medical History:  Diagnosis Date  . Arthritis   . Hypertension    gestational   . Sjogren's syndrome  [facility])     Past Surgical History:  Procedure Laterality Date  . NO PAST SURGERIES      Family History  Problem Relation Age of Onset  . Hypertension Father   . Arthritis Father   . Obesity Maternal Uncle   . Cancer Maternal Grandmother        brain  . Heart disease Paternal Grandmother   . Hypertension Paternal Grandmother   . Thyroid disease Paternal Grandmother   . Thyroid disease Paternal Grandfather     Social History   Tobacco Use  . Smoking status: Never Smoker  . Smokeless tobacco: Never Used  Vaping Use  . Vaping Use: Never used  Substance Use Topics  . Alcohol use: Yes    Comment: rarely  . Drug use: Never    Allergies:  Allergies  Allergen Reactions  . Kiwi Extract Swelling  . Latex   . Other Swelling    Blueberries, blackberries, raspberries  . Penicillins     No medications prior to admission.    Review of Systems  Constitutional: Negative for chills, diaphoresis, fatigue and fever.  Eyes: Negative for visual disturbance.  Respiratory: Negative for shortness of breath.   Cardiovascular: Negative for chest pain.  Gastrointestinal: Positive for abdominal pain. Negative for constipation, diarrhea, nausea and vomiting.  Genitourinary: Negative for dysuria, flank  pain, frequency, pelvic pain, urgency, vaginal bleeding and vaginal discharge.  Neurological: Negative for dizziness, weakness, light-headedness and headaches.   Physical Exam   Blood pressure 131/74, pulse 99, temperature 98.4 F (36.9 C), resp. rate 17, height 5\' 5"  (1.651 m), weight 110.7 kg, last menstrual period 11/08/2019, SpO2 100 %.  Patient Vitals for the past 24 hrs:  BP Temp Pulse Resp SpO2 Height Weight  05/01/20 2053 -- -- 99 17 100 % -- --  05/01/20 2049 131/74 -- (!) 114 -- -- -- --  05/01/20 2045 -- -- -- -- 98 % -- --  05/01/20 1939 133/77 -- (!) 117 -- -- -- --  05/01/20 1934 -- 98.4 F (36.9 C) -- 18 -- 5\' 5"  (1.651 m) 110.7 kg   Physical Exam Vitals and nursing  note reviewed. Exam conducted with a chaperone present.  Constitutional:      Appearance: She is well-developed.  HENT:     Head: Normocephalic and atraumatic.  Pulmonary:     Effort: Pulmonary effort is normal.  Abdominal:     Palpations: Abdomen is soft.  Genitourinary:    General: Normal vulva.     Comments: Dilation: Closed Effacement (%): Thick Cervical Position: Posterior Station: Ballotable Presentation: Undeterminable Exam by:: NN Neurological:     Mental Status: She is alert and oriented to person, place, and time.  Psychiatric:        Mood and Affect: Mood normal.        Behavior: Behavior normal.        Thought Content: Thought content normal.        Judgment: Judgment normal.    Results for orders placed or performed during the hospital encounter of 05/01/20 (from the past 24 hour(s))  Urinalysis, Routine w reflex microscopic Urine, Clean Catch     Status: Abnormal   Collection Time: 05/01/20  8:04 PM  Result Value Ref Range   Color, Urine YELLOW YELLOW   APPearance CLEAR CLEAR   Specific Gravity, Urine 1.020 1.005 - 1.030   pH 7.0 5.0 - 8.0   Glucose, UA NEGATIVE NEGATIVE mg/dL   Hgb urine dipstick NEGATIVE NEGATIVE   Bilirubin Urine NEGATIVE NEGATIVE   Ketones, ur 40 (A) NEGATIVE mg/dL   Protein, ur NEGATIVE NEGATIVE mg/dL   Nitrite NEGATIVE NEGATIVE   Leukocytes,Ua NEGATIVE NEGATIVE    MAU Course  Procedures  MDM -suspect RLP, r/o PTL -UA: 40ketones -CE: Dilation: Closed Effacement (%): Thick Cervical Position: Posterior Station: Ballotable Presentation: Undeterminable Exam by:: NN -EFM: reassuring       -baseline: 150/145       -variability: moderate       -accels: absent       -decels: absent       -TOCO: quiet -pain 2/10 -pt discharged to home in stable condition  Orders Placed This Encounter  Procedures  . Urinalysis, Routine w reflex microscopic Urine, Clean Catch    Standing Status:   Standing    Number of Occurrences:   1  .  Discharge patient    Order Specific Question:   Discharge disposition    Answer:   01-Home or Self Care [1]    Order Specific Question:   Discharge patient date    Answer:   05/01/2020    Assessment and Plan   1. Round ligament pain   2. [redacted] weeks gestation of pregnancy   3. NST (non-stress test) reactive     Allergies as of 05/01/2020      Reactions  Kiwi Extract Swelling   Latex    Other Swelling   Blueberries, blackberries, raspberries   Penicillins       Medication List    TAKE these medications   aspirin 81 MG chewable tablet Chew 162 mg by mouth daily.   Blood Pressure Monitor Misc For regular home bp monitoring during pregnancy   multivitamin-prenatal 27-0.8 MG Tabs tablet Take 1 tablet by mouth daily at 12 noon.   pantoprazole 20 MG tablet Commonly known as: Protonix Take 1 tablet (20 mg total) by mouth daily.      -discussed s/sx of RLP vs. PTL -return MAU precautions given -pt discharged to home in stable condition  Joni Reining E Oneal Schoenberger 05/01/2020, 9:00 PM

## 2020-05-05 ENCOUNTER — Encounter: Payer: Medicaid Other | Admitting: Advanced Practice Midwife

## 2020-05-07 ENCOUNTER — Encounter: Payer: Self-pay | Admitting: Advanced Practice Midwife

## 2020-05-07 ENCOUNTER — Telehealth (INDEPENDENT_AMBULATORY_CARE_PROVIDER_SITE_OTHER): Payer: Medicaid Other | Admitting: Advanced Practice Midwife

## 2020-05-07 VITALS — BP 130/85 | HR 113

## 2020-05-07 DIAGNOSIS — Z3A25 25 weeks gestation of pregnancy: Secondary | ICD-10-CM

## 2020-05-07 DIAGNOSIS — O26892 Other specified pregnancy related conditions, second trimester: Secondary | ICD-10-CM

## 2020-05-07 DIAGNOSIS — Z3480 Encounter for supervision of other normal pregnancy, unspecified trimester: Secondary | ICD-10-CM

## 2020-05-07 DIAGNOSIS — Z302 Encounter for sterilization: Secondary | ICD-10-CM

## 2020-05-07 DIAGNOSIS — R519 Headache, unspecified: Secondary | ICD-10-CM

## 2020-05-07 DIAGNOSIS — J069 Acute upper respiratory infection, unspecified: Secondary | ICD-10-CM

## 2020-05-07 MED ORDER — BUTALBITAL-APAP-CAFFEINE 50-325-40 MG PO TABS: 1.0000 | ORAL_TABLET | Freq: Four times a day (QID) | ORAL | 1 refills | Status: DC | PRN

## 2020-05-07 NOTE — Progress Notes (Signed)
TELEHEALTH VIRTUAL OBSTETRICS VISIT ENCOUNTER NOTE Patient name: Melanie Russo MRN [id number]  Date of birth: June 07, 1996  I connected with patient on 05/07/20 at  3:10 PM EDT by MyChart and verified that I am speaking with the correct person using two identifiers. Due to COVID-19 recommendations, pt is not currently in our office but is at home; I am in the office (she and her child are sick).   I discussed the limitations, risks, security and privacy concerns of performing an evaluation and management service by telephone and the availability of in person appointments. I also discussed with the patient that there may be a patient responsible charge related to this service. The patient expressed understanding and agreed to proceed.  Chief Complaint:   Routine Prenatal Visit  History of Present Illness:   Melanie Russo is a 24 y.o. G69P1001 female at [redacted]w[redacted]d with an Estimated Date of Delivery: 08/14/20 being evaluated today for ongoing management of a low-risk pregnancy.  Depression screen [facility] 2/9 02/05/2020 12/14/2019  Decreased Interest 1 2  Down, Depressed, Hopeless 1 0  PHQ - 2 Score 2 2  Altered sleeping 3 3  Tired, decreased energy 3 3  Change in appetite 3 1  Feeling bad or failure about yourself  1 0  Trouble concentrating 1 2  Moving slowly or fidgety/restless 0 0  Suicidal thoughts 0 0  PHQ-9 Score 13 11  Difficult doing work/chores - Not difficult at all    Today she reports congestion (no cough or fever). Contractions: Not present. Vag. Bleeding: None.  Movement: Present. denies leaking of fluid. Review of Systems:   Pertinent items are noted in HPI Denies abnormal vaginal discharge w/ itching/odor/irritation, headaches, visual changes, shortness of breath, chest pain, abdominal pain, severe nausea/vomiting, or problems with urination or bowel movements unless otherwise stated above. Pertinent History Reviewed:  Reviewed past medical,surgical, social, obstetrical and family  history.  Reviewed problem list, medications and allergies. Physical Assessment:   Vitals:   05/07/20 1503  BP: 130/85  Pulse: (!) 113  There is no height or weight on file to calculate BMI.        Physical Examination:   General:  Alert, oriented and cooperative.   Mental Status: Normal mood and affect perceived. Normal judgment and thought content.  Rest of physical exam deferred due to type of encounter  No results found for this or any previous visit (from the past 24 hour(s)).  Assessment & Plan:  1) Pregnancy G2P1001 at [redacted]w[redacted]d with an Estimated Date of Delivery: 08/14/20   2) URI, congestion, no cough or fever; given precautions and is staying home  3) Desires salpingectomy, will have her sign 30d papers at next visit  4) Frequent headaches, doesn't have one now, but sent in meds for when they occur and are resistant to ES Tylenol   Meds:  Meds ordered this encounter  Medications  . butalbital-acetaminophen-caffeine (FIORICET) 50-325-40 MG tablet    Sig: Take 1-2 tablets by mouth every 6 (six) hours as needed for headache.    Dispense:  20 tablet    Refill:  1    Order Specific Question:   Supervising Provider    Answer:   Lazaro Arms [[id number]]    Labs/procedures today: none  Plan:  Continue routine obstetrical care.  Has home bp cuff.  Check bp weekly, let us know if >140/90.  Next visit: prefers will be in person for PN2    Reviewed: Preterm labor symptoms and general obstetric precautions  including but not limited to vaginal bleeding, contractions, leaking of fluid and fetal movement were reviewed in detail with the patient. The patient was advised to call back or seek an in-person office evaluation/go to [facility]U at [facility] for any urgent or concerning symptoms. All questions were answered. Please refer to After Visit Summary for other counseling recommendations.    I provided 17 minutes of non-face-to-face time during this encounter.  Follow-up:  Return in about 3 weeks (around 05/28/2020) for LROB, PN2, in person.  No orders of the defined types were placed in this encounter.  Arabella Merles CNM 05/07/2020 3:31 PM

## 2020-05-09 ENCOUNTER — Other Ambulatory Visit: Payer: Self-pay

## 2020-05-09 ENCOUNTER — Inpatient Hospital Stay (HOSPITAL_COMMUNITY)
Admission: AD | Admit: 2020-05-09 | Discharge: 2020-05-09 | Disposition: A | Discharge status: Home / Self Care | Payer: Medicaid Other | Attending: Obstetrics and Gynecology | Admitting: Obstetrics and Gynecology

## 2020-05-09 ENCOUNTER — Encounter (HOSPITAL_COMMUNITY): Payer: Self-pay | Admitting: Obstetrics and Gynecology

## 2020-05-09 ENCOUNTER — Inpatient Hospital Stay (HOSPITAL_COMMUNITY): Payer: Medicaid Other

## 2020-05-09 ENCOUNTER — Telehealth: Payer: Self-pay

## 2020-05-09 DIAGNOSIS — Z302 Encounter for sterilization: Secondary | ICD-10-CM

## 2020-05-09 DIAGNOSIS — O99612 Diseases of the digestive system complicating pregnancy, second trimester: Secondary | ICD-10-CM

## 2020-05-09 DIAGNOSIS — K219 Gastro-esophageal reflux disease without esophagitis: Secondary | ICD-10-CM | POA: Insufficient documentation

## 2020-05-09 DIAGNOSIS — O26892 Other specified pregnancy related conditions, second trimester: Secondary | ICD-10-CM | POA: Insufficient documentation

## 2020-05-09 DIAGNOSIS — R000 Tachycardia, unspecified: Secondary | ICD-10-CM

## 2020-05-09 DIAGNOSIS — O99412 Diseases of the circulatory system complicating pregnancy, second trimester: Secondary | ICD-10-CM | POA: Diagnosis not present

## 2020-05-09 DIAGNOSIS — Z20822 Contact with and (suspected) exposure to covid-19: Secondary | ICD-10-CM | POA: Diagnosis not present

## 2020-05-09 DIAGNOSIS — Z3A26 26 weeks gestation of pregnancy: Secondary | ICD-10-CM | POA: Insufficient documentation

## 2020-05-09 DIAGNOSIS — R079 Chest pain, unspecified: Secondary | ICD-10-CM | POA: Diagnosis not present

## 2020-05-09 DIAGNOSIS — R0789 Other chest pain: Secondary | ICD-10-CM | POA: Diagnosis not present

## 2020-05-09 DIAGNOSIS — O99891 Other specified diseases and conditions complicating pregnancy: Secondary | ICD-10-CM

## 2020-05-09 DIAGNOSIS — O132 Gestational [pregnancy-induced] hypertension without significant proteinuria, second trimester: Secondary | ICD-10-CM | POA: Diagnosis not present

## 2020-05-09 DIAGNOSIS — Z3480 Encounter for supervision of other normal pregnancy, unspecified trimester: Secondary | ICD-10-CM

## 2020-05-09 DIAGNOSIS — I10 Essential (primary) hypertension: Secondary | ICD-10-CM | POA: Insufficient documentation

## 2020-05-09 LAB — URINALYSIS, ROUTINE W REFLEX MICROSCOPIC
Bilirubin Urine: NEGATIVE
Glucose, UA: NEGATIVE mg/dL
Hgb urine dipstick: NEGATIVE
Ketones, ur: NEGATIVE mg/dL
Nitrite: NEGATIVE
Protein, ur: NEGATIVE mg/dL
Specific Gravity, Urine: 1.006 (ref 1.005–1.030)
pH: 7 (ref 5.0–8.0)

## 2020-05-09 LAB — COMPREHENSIVE METABOLIC PANEL
ALT: 28 U/L (ref 0–44)
AST: 26 U/L (ref 15–41)
Albumin: 2.7 g/dL — ABNORMAL LOW (ref 3.5–5.0)
Alkaline Phosphatase: 119 U/L (ref 38–126)
Anion gap: 11 (ref 5–15)
BUN: <5 mg/dL — ABNORMAL LOW (ref 6–20)
CO2: 21 mmol/L — ABNORMAL LOW (ref 22–32)
Calcium: 8.8 mg/dL — ABNORMAL LOW (ref 8.9–10.3)
Chloride: 104 mmol/L (ref 98–111)
Creatinine, Ser: 0.51 mg/dL (ref 0.44–1.00)
GFR calc Af Amer: >60 mL/min (ref >60)
GFR calc non Af Amer: >60 mL/min (ref >60)
Glucose, Bld: 110 mg/dL — ABNORMAL HIGH (ref 70–99)
Potassium: 3.6 mmol/L (ref 3.5–5.1)
Sodium: 136 mmol/L (ref 135–145)
Total Bilirubin: 0.6 mg/dL (ref 0.3–1.2)
Total Protein: 6.4 g/dL — ABNORMAL LOW (ref 6.5–8.1)

## 2020-05-09 LAB — CBC
HCT: 35.4 % — ABNORMAL LOW (ref 36.0–46.0)
Hemoglobin: 11.5 g/dL — ABNORMAL LOW (ref 12.0–15.0)
MCH: 27.4 pg (ref 26.0–34.0)
MCHC: 32.5 g/dL (ref 30.0–36.0)
MCV: 84.3 fL (ref 80.0–100.0)
Platelets: 271 10*3/uL (ref 150–400)
RBC: 4.2 MIL/uL (ref 3.87–5.11)
RDW: 13.1 % (ref 11.5–15.5)
WBC: 8 10*3/uL (ref 4.0–10.5)
nRBC: 0 % (ref 0.0–0.2)

## 2020-05-09 LAB — PROTEIN / CREATININE RATIO, URINE
Creatinine, Urine: 63.3 mg/dL
Protein Creatinine Ratio: 0.13 mg/mg{Cre} (ref 0.00–0.15)
Total Protein, Urine: 8 mg/dL

## 2020-05-09 LAB — TSH: TSH: 1.259 u[IU]/mL (ref 0.350–4.500)

## 2020-05-09 LAB — RESPIRATORY PANEL BY RT PCR (FLU A&B, COVID)
Influenza A by PCR: NEGATIVE
Influenza B by PCR: NEGATIVE
SARS Coronavirus 2 by RT PCR: NEGATIVE

## 2020-05-09 LAB — D-DIMER, QUANTITATIVE: D-Dimer, Quant: 0.42 ug/mL-FEU (ref 0.00–0.50)

## 2020-05-09 MED ORDER — ALUM & MAG HYDROXIDE-SIMETH 200-200-20 MG/5ML PO SUSP
30.0000 mL | Freq: Once | ORAL | Status: AC
  Administered 2020-05-09: 30 mL via ORAL
  Filled 2020-05-09: qty 30

## 2020-05-09 MED ORDER — IOHEXOL 350 MG/ML SOLN
100.0000 mL | Freq: Once | INTRAVENOUS | Status: AC | PRN
  Administered 2020-05-09: 100 mL via INTRAVENOUS

## 2020-05-09 MED ORDER — LIDOCAINE VISCOUS HCL 2 % MT SOLN
15.0000 mL | Freq: Once | OROMUCOSAL | Status: AC
  Administered 2020-05-09: 15 mL via ORAL
  Filled 2020-05-09: qty 15

## 2020-05-09 NOTE — Discharge Instructions (Signed)
Gastroesophageal Reflux Disease, Adult Gastroesophageal reflux (GER) happens when acid from the stomach flows up into the tube that connects the mouth and the stomach (esophagus). Normally, food travels down the esophagus and stays in the stomach to be digested. With GER, food and stomach acid sometimes move back up into the esophagus. You may have a disease called gastroesophageal reflux disease (GERD) if the reflux:  Happens often.  Causes frequent or very bad symptoms.  Causes problems such as damage to the esophagus. When this happens, the esophagus becomes sore and swollen (inflamed). Over time, GERD can make small holes (ulcers) in the lining of the esophagus. What are the causes? This condition is caused by a problem with the muscle between the esophagus and the stomach. When this muscle is weak or not normal, it does not close properly to keep food and acid from coming back up from the stomach. The muscle can be weak because of:  Tobacco use.  Pregnancy.  Having a certain type of hernia (hiatal hernia).  Alcohol use.  Certain foods and drinks, such as coffee, chocolate, onions, and peppermint. What increases the risk? You are more likely to develop this condition if you:  Are overweight.  Have a disease that affects your connective tissue.  Use NSAID medicines. What are the signs or symptoms? Symptoms of this condition include:  Heartburn.  Difficult or painful swallowing.  The feeling of having a lump in the throat.  A bitter taste in the mouth.  Bad breath.  Having a lot of saliva.  Having an upset or bloated stomach.  Belching.  Chest pain. Different conditions can cause chest pain. Make sure you see your doctor if you have chest pain.  Shortness of breath or noisy breathing (wheezing).  Ongoing (chronic) cough or a cough at night.  Wearing away of the surface of teeth (tooth enamel).  Weight loss. How is this treated? Treatment will depend on how  bad your symptoms are. Your doctor may suggest:  Changes to your diet.  Medicine.  Surgery. Follow these instructions at home: Eating and drinking   Follow a diet as told by your doctor. You may need to avoid foods and drinks such as: ? Coffee and tea (with or without caffeine). ? Drinks that contain alcohol. ? Energy drinks and sports drinks. ? Bubbly (carbonated) drinks or sodas. ? Chocolate and cocoa. ? Peppermint and mint flavorings. ? Garlic and onions. ? Horseradish. ? Spicy and acidic foods. These include peppers, chili powder, curry powder, vinegar, hot sauces, and BBQ sauce. ? Citrus fruit juices and citrus fruits, such as oranges, lemons, and limes. ? Tomato-based foods. These include red sauce, chili, salsa, and pizza with red sauce. ? Fried and fatty foods. These include donuts, french fries, potato chips, and high-fat dressings. ? High-fat meats. These include hot dogs, rib eye steak, sausage, ham, and bacon. ? High-fat dairy items, such as whole milk, butter, and cream cheese.  Eat small meals often. Avoid eating large meals.  Avoid drinking large amounts of liquid with your meals.  Avoid eating meals during the 2-3 hours before bedtime.  Avoid lying down right after you eat.  Do not exercise right after you eat. Lifestyle   Do not use any products that contain nicotine or tobacco. These include cigarettes, e-cigarettes, and chewing tobacco. If you need help quitting, ask your doctor.  Try to lower your stress. If you need help doing this, ask your doctor.  If you are overweight, lose an amount  of weight that is healthy for you. Ask your doctor about a safe weight loss goal. General instructions  Pay attention to any changes in your symptoms.  Take over-the-counter and prescription medicines only as told by your doctor. Do not take aspirin, ibuprofen, or other NSAIDs unless your doctor says it is okay.  Wear loose clothes. Do not wear anything tight  around your waist.  Raise (elevate) the head of your bed about 6 inches (15 cm).  Avoid bending over if this makes your symptoms worse.  Keep all follow-up visits as told by your doctor. This is important. Contact a doctor if:  You have new symptoms.  You lose weight and you do not know why.  You have trouble swallowing or it hurts to swallow.  You have wheezing or a cough that keeps happening.  Your symptoms do not get better with treatment.  You have a hoarse voice. Get help right away if:  You have pain in your arms, neck, jaw, teeth, or back.  You feel sweaty, dizzy, or light-headed.  You have chest pain or shortness of breath.  You throw up (vomit) and your throw-up looks like blood or coffee grounds.  You pass out (faint).  Your poop (stool) is bloody or black.  You cannot swallow, drink, or eat. Summary  If a person has gastroesophageal reflux disease (GERD), food and stomach acid move back up into the esophagus and cause symptoms or problems such as damage to the esophagus.  Treatment will depend on how bad your symptoms are.  Follow a diet as told by your doctor.  Take all medicines only as told by your doctor. This information is not intended to replace advice given to you by your health care provider. Make sure you discuss any questions you have with your health care provider. Document Revised: 02/01/2018 Document Reviewed: 02/01/2018 Elsevier Patient Education  2020 [employer]   Sinus Tachycardia  Sinus tachycardia is a kind of fast heartbeat. In sinus tachycardia, the heart beats more than 100 times a minute. Sinus tachycardia starts in a part of the heart called the sinus node. Sinus tachycardia may be harmless, or it may be a sign of a serious condition. What are the causes? This condition may be caused by:  Exercise or exertion.  A fever.  Pain.  Loss of body fluids (dehydration).  Severe bleeding (hemorrhage).  Anxiety and  stress.  Certain substances, including: ? Alcohol. ? Caffeine. ? Tobacco and nicotine products. ? Cold medicines. ? Illegal drugs.  Medical conditions including: ? Heart disease. ? An infection. ? An overactive thyroid (hyperthyroidism). ? A lack of red blood cells (anemia). What are the signs or symptoms? Symptoms of this condition include:  A feeling that the heart is beating quickly (palpitations).  Suddenly noticing your heartbeat (cardiac awareness).  Dizziness.  Tiredness (fatigue).  Shortness of breath.  Chest pain.  Nausea.  Fainting. How is this diagnosed? This condition is diagnosed with:  A physical exam.  Other tests, such as: ? Blood tests. ? An electrocardiogram (ECG). This test measures the electrical activity of the heart. ? Ambulatory cardiac monitor. This records your heartbeats for 24 hours or more. You may be referred to a heart specialist (cardiologist). How is this treated? Treatment for this condition depends on the cause or the underlying condition. Treatment may involve:  Treating the underlying condition.  Taking new medicines or changing your current medicines as told by your health care provider.  Making changes to your  diet or lifestyle. Follow these instructions at home: Lifestyle   Do not use any products that contain nicotine or tobacco, such as cigarettes and e-cigarettes. If you need help quitting, ask your health care provider.  Do not use illegal drugs, such as cocaine.  Learn relaxation methods to help you when you get stressed or anxious. These include deep breathing.  Avoid caffeine or other stimulants. Alcohol use   Do not drink alcohol if: ? Your health care provider tells you not to drink. ? You are pregnant, may be pregnant, or are planning to become pregnant.  If you drink alcohol, limit how much you have: ? 0-1 drink a day for women. ? 0-2 drinks a day for men.  Be aware of how much alcohol is in your  drink. In the [place] one drink equals one typical bottle of beer (12 oz), one-half glass of wine (5 oz), or one shot of hard liquor (1 oz). General instructions  Drink enough fluids to keep your urine pale yellow.  Take over-the-counter and prescription medicines only as told by your health care provider.  Keep all follow-up visits as told by your health care provider. This is important. Contact a health care provider if you have:  A fever.  Vomiting or diarrhea that does not go away. Get help right away if you:  Have pain in your chest, upper arms, jaw, or neck.  Become weak or dizzy.  Feel faint.  Have palpitations that do not go away. Summary  In sinus tachycardia, the heart beats more than 100 times a minute.  Sinus tachycardia may be harmless, or it may be a sign of a serious condition.  Treatment for this condition depends on the cause or the underlying condition.  Get help right away if you have pain in your chest, upper arms, jaw, or neck. This information is not intended to replace advice given to you by your health care provider. Make sure you discuss any questions you have with your health care provider. Document Revised: 09/14/2017 Document Reviewed: 09/14/2017 Elsevier Patient Education  2020 [employer]   Hypertension During Pregnancy Hypertension is also called high blood pressure. High blood pressure means that the force of your blood moving in your body is too strong. It can cause problems for you and your baby. Different types of high blood pressure can happen during pregnancy. The types are:  High blood pressure before you got pregnant. This is called chronic hypertension.  This can continue during your pregnancy. Your doctor will want to keep checking your blood pressure. You may need medicine to keep your blood pressure under control while you are pregnant. You will need follow-up visits after you have your baby.  High blood pressure that goes up  during pregnancy when it was normal before. This is called gestational hypertension. It will usually get better after you have your baby, but your doctor will need to watch your blood pressure to make sure that it is getting better.  Very high blood pressure during pregnancy. This is called preeclampsia. Very high blood pressure is an emergency that needs to be checked and treated right away.  You may develop very high blood pressure after giving birth. This is called postpartum preeclampsia. This usually occurs within 48 hours after childbirth but may occur up to 6 weeks after giving birth. This is rare. How does this affect me? If you have high blood pressure during pregnancy, you have a higher chance of developing high blood pressure:  As you get older.  If you get pregnant again. In some cases, high blood pressure during pregnancy can cause:  Stroke.  Heart attack.  Damage to the kidneys, lungs, or liver.  Preeclampsia.  Jerky movements you cannot control (convulsions or seizures).  Problems with the placenta. How does this affect my baby? Your baby may:  Be born early.  Not weigh as much as he or she should.  Not handle labor well, leading to a c-section birth. What are the risks?  Having high blood pressure during a past pregnancy.  Being overweight.  Being 28 years old or older.  Being pregnant for the first time.  Being pregnant with more than one baby.  Becoming pregnant using fertility methods, such as IVF.  Having other problems, such as diabetes, or kidney disease.  Having family members who have high blood pressure. What can I do to lower my risk?   Keep a healthy weight.  Eat a healthy diet.  Follow what your doctor tells you about treating any medical problems that you had before becoming pregnant. It is very important to go to all of your doctor visits. Your doctor will check your blood pressure and make sure that your pregnancy is progressing  as it should. Treatment should start early if a problem is found. How is this treated? Treatment for high blood pressure during pregnancy can differ depending on the type of high blood pressure you have and how serious it is.  You may need to take blood pressure medicine.  If you have been taking medicine for your blood pressure, you may need to change the medicine during pregnancy if it is not safe for your baby.  If your doctor thinks that you could get very high blood pressure, he or she may tell you to take a low-dose aspirin during your pregnancy.  If you have very high blood pressure, you may need to stay in the hospital so you and your baby can be watched closely. You may also need to take medicine to lower your blood pressure. This medicine may be given by mouth or through an IV tube.  In some cases, if your condition gets worse, you may need to have your baby early. Follow these instructions at home: Eating and drinking   Drink enough fluid to keep your pee (urine) pale yellow.  Avoid caffeine. Lifestyle  Do not use any products that contain nicotine or tobacco, such as cigarettes, e-cigarettes, and chewing tobacco. If you need help quitting, ask your doctor.  Do not use alcohol or drugs.  Avoid stress.  Rest and get plenty of sleep.  Regular exercise can help. Ask your doctor what kinds of exercise are best for you. General instructions  Take over-the-counter and prescription medicines only as told by your doctor.  Keep all prenatal and follow-up visits as told by your doctor. This is important. Contact a doctor if:  You have symptoms that your doctor told you to watch for, such as: ? Headaches. ? Nausea. ? Vomiting. ? Belly (abdominal) pain. ? Dizziness. ? Light-headedness. Get help right away if:  You have: ? Very bad belly pain that does not get better with treatment. ? A very bad headache that does not get better. ? Vomiting that does not get  better. ? Sudden, fast weight gain. ? Sudden swelling in your hands, ankles, or face. ? Bleeding from your vagina. ? Blood in your pee. ? Blurry vision. ? Double vision. ? Shortness of breath. ?  Chest pain. ? Weakness on one side of your body. ? Trouble talking.  Your baby is not moving as much as usual. Summary  High blood pressure is also called hypertension.  High blood pressure means that the force of your blood moving in your body is too strong.  High blood pressure can cause problems for you and your baby.  Keep all follow-up visits as told by your doctor. This is important. This information is not intended to replace advice given to you by your health care provider. Make sure you discuss any questions you have with your health care provider. Document Revised: 11/16/2018 Document Reviewed: 08/22/2018 Elsevier Patient Education  2020 [employer].

## 2020-05-09 NOTE — Telephone Encounter (Signed)
Pt feels lightheaded, pain around left breast. Cold symptoms x 6 days. Pt hasn't taken any cold meds, just Tylenol. Has felt the baby move once or twice today. Was very active around midnight.  BP while on phone 150/93 pulse 129. I spoke with Louie Bun, CNM. Pt was advised to go to [facility] at [facility] for eval. Advised pt to have someone drive her. Pt voiced understanding. JSY

## 2020-05-09 NOTE — MAU Provider Note (Addendum)
History     CSN: [id number]  Arrival date and time: 05/09/20 1342  Chief Complaint  Patient presents with  . Hypertension   24 y.o. G2P1001 @25 .1 wks presenting with elevated BP and lightheadedness. Sx started today. She checked her BP and was 150/93. Also c/o chest pain that started shortly after. Describes as tightness in left upper chest. Reports cold sx x1 week. Has non-productive cough. Denies fever. Denies SOB. Denies Covid exposure. Has not been vaccinated. Denies HA, visual disturbance and RUQ pain. +FM. Hx of gHTN in last pregnancy.   OB History    Gravida  2   Para  1   Term  1   Preterm      AB      Living  1     SAB      TAB      Ectopic      Multiple      Live Births  1           Past Medical History:  Diagnosis Date  . Arthritis   . Hypertension    gestational   . Sjogren's syndrome [facility])     Past Surgical History:  Procedure Laterality Date  . NO PAST SURGERIES      Family History  Problem Relation Age of Onset  . Hypertension Father   . Arthritis Father   . Obesity Maternal Uncle   . Cancer Maternal Grandmother        brain  . Heart disease Paternal Grandmother   . Hypertension Paternal Grandmother   . Thyroid disease Paternal Grandmother   . Thyroid disease Paternal Grandfather     Social History   Tobacco Use  . Smoking status: Never Smoker  . Smokeless tobacco: Never Used  Vaping Use  . Vaping Use: Never used  Substance Use Topics  . Alcohol use: Not Currently    Comment: rarely  . Drug use: Never    Allergies:  Allergies  Allergen Reactions  . Kiwi Extract Swelling  . Latex   . Other Swelling    Blueberries, blackberries, raspberries  . Penicillins     Medications Prior to Admission  Medication Sig Dispense Refill Last Dose  . aspirin 81 MG chewable tablet Chew 162 mg by mouth daily.   05/08/2020 at Unknown time  . butalbital-acetaminophen-caffeine (FIORICET) 50-325-40 MG tablet Take 1-2 tablets by mouth  every 6 (six) hours as needed for headache. 20 tablet 1 05/08/2020 at Unknown time  . pantoprazole (PROTONIX) 20 MG tablet Take 1 tablet (20 mg total) by mouth daily. 30 tablet 6 05/08/2020 at Unknown time  . Prenatal Vit-Fe Fumarate-FA (MULTIVITAMIN-PRENATAL) 27-0.8 MG TABS tablet Take 1 tablet by mouth daily at 12 noon.   05/08/2020 at Unknown time  . Blood Pressure Monitor MISC For regular home bp monitoring during pregnancy (Patient not taking: Reported on 05/07/2020) 1 each 0     Review of Systems  Constitutional: Negative for chills and fever.  HENT: Negative for sore throat.   Respiratory: Positive for cough. Negative for shortness of breath.   Cardiovascular: Positive for chest pain.  Gastrointestinal: Negative for abdominal pain.  Genitourinary: Negative for vaginal bleeding.  Neurological: Positive for light-headedness. Negative for syncope and headaches.   Physical Exam   Blood pressure 125/70, pulse 99, temperature 98.5 F (36.9 C), resp. rate 18, height 5\' 5"  (1.651 m), weight 110.7 kg, last menstrual period 11/08/2019, SpO2 98 %. Patient Vitals for the past 24 hrs:  BP Temp Pulse  Resp SpO2 Height Weight  05/09/20 1717 125/70 -- 99 -- -- -- --  05/09/20 1701 (!) 128/105 -- (!) 145 -- 98 % -- --  05/09/20 1647 112/67 -- (!) 144 -- -- -- --  05/09/20 1632 126/60 -- (!) 136 -- -- -- --  05/09/20 1531 (!) 142/68 -- (!) 137 -- 98 % -- --  05/09/20 1513 134/83 -- (!) 147 -- -- -- --  05/09/20 1431 (!) 123/102 -- (!) 143 -- 98 % -- --  05/09/20 1424 (!) 109/56 -- (!) 128 -- -- -- --  05/09/20 1408 (!) 148/85 98.5 F (36.9 C) (!) 127 18 -- 5\' 5"  (1.651 m) 110.7 kg   Physical Exam Vitals and nursing note reviewed.  Constitutional:      General: She is not in acute distress.    Appearance: Normal appearance.  HENT:     Head: Normocephalic and atraumatic.  Cardiovascular:     Rate and Rhythm: Regular rhythm. Tachycardia present.  Pulmonary:     Effort: Pulmonary effort is  normal. No respiratory distress.     Breath sounds: No stridor. No wheezing, rhonchi or rales.  Musculoskeletal:        General: No swelling. Normal range of motion.     Cervical back: Normal range of motion.  Skin:    General: Skin is warm and dry.  Neurological:     General: No focal deficit present.     Mental Status: She is alert and oriented to person, place, and time.  Psychiatric:        Mood and Affect: Mood normal.        Behavior: Behavior normal.   EFM: 155 bpm, mod variability, + accels, no decels Toco: none  Results for orders placed or performed during the hospital encounter of 05/09/20 (from the past 24 hour(s))  CBC     Status: Abnormal   Collection Time: 05/09/20  2:48 PM  Result Value Ref Range   WBC 8.0 4.0 - 10.5 K/uL   RBC 4.20 3.87 - 5.11 MIL/uL   Hemoglobin 11.5 (L) 12.0 - 15.0 g/dL   HCT 96.035.4 (L) 36 - 46 %   MCV 84.3 80.0 - 100.0 fL   MCH 27.4 26.0 - 34.0 pg   MCHC 32.5 30.0 - 36.0 g/dL   RDW 45.413.1 09.811.5 - 11.915.5 %   Platelets 271 150 - 400 K/uL   nRBC 0.0 0.0 - 0.2 %  Comprehensive metabolic panel     Status: Abnormal   Collection Time: 05/09/20  2:48 PM  Result Value Ref Range   Sodium 136 135 - 145 mmol/L   Potassium 3.6 3.5 - 5.1 mmol/L   Chloride 104 98 - 111 mmol/L   CO2 21 (L) 22 - 32 mmol/L   Glucose, Bld 110 (H) 70 - 99 mg/dL   BUN <5 (L) 6 - 20 mg/dL   Creatinine, Ser 1.470.51 0.44 - 1.00 mg/dL   Calcium 8.8 (L) 8.9 - 10.3 mg/dL   Total Protein 6.4 (L) 6.5 - 8.1 g/dL   Albumin 2.7 (L) 3.5 - 5.0 g/dL   AST 26 15 - 41 U/L   ALT 28 0 - 44 U/L   Alkaline Phosphatase 119 38 - 126 U/L   Total Bilirubin 0.6 0.3 - 1.2 mg/dL   GFR calc non Af Amer >60 >60 mL/min   GFR calc Af Amer >60 >60 mL/min   Anion gap 11 5 - 15  D-dimer, quantitative (not at [facility])  Status: None   Collection Time: 05/09/20  2:48 PM  Result Value Ref Range   D-Dimer, Quant 0.42 0.00 - 0.50 ug/mL-FEU  Respiratory Panel by RT PCR (Flu A&B, Covid) - Nasopharyngeal Swab      Status: None   Collection Time: 05/09/20  3:09 PM   Specimen: Nasopharyngeal Swab  Result Value Ref Range   SARS Coronavirus 2 by RT PCR NEGATIVE NEGATIVE   Influenza A by PCR NEGATIVE NEGATIVE   Influenza B by PCR NEGATIVE NEGATIVE  Urinalysis, Routine w reflex microscopic Urine, Clean Catch     Status: Abnormal   Collection Time: 05/09/20  3:30 PM  Result Value Ref Range   Color, Urine YELLOW YELLOW   APPearance HAZY (A) CLEAR   Specific Gravity, Urine 1.006 1.005 - 1.030   pH 7.0 5.0 - 8.0   Glucose, UA NEGATIVE NEGATIVE mg/dL   Hgb urine dipstick NEGATIVE NEGATIVE   Bilirubin Urine NEGATIVE NEGATIVE   Ketones, ur NEGATIVE NEGATIVE mg/dL   Protein, ur NEGATIVE NEGATIVE mg/dL   Nitrite NEGATIVE NEGATIVE   Leukocytes,Ua SMALL (A) NEGATIVE   RBC / HPF 0-5 0 - 5 RBC/hpf   WBC, UA 0-5 0 - 5 WBC/hpf   Bacteria, UA FEW (A) NONE SEEN   Squamous Epithelial / LPF 11-20 0 - 5  Protein / creatinine ratio, urine     Status: None   Collection Time: 05/09/20  3:30 PM  Result Value Ref Range   Creatinine, Urine 63.30 mg/dL   Total Protein, Urine 8 mg/dL   Protein Creatinine Ratio 0.13 0.00 - 0.15 mg/mg[Cre]    DG Chest 1 View  Result Date: 05/09/2020 CLINICAL DATA:  Chest pain EXAM: CHEST  1 VIEW COMPARISON:  None. FINDINGS: The heart size and mediastinal contours are within normal limits. Both lungs are clear. The visualized skeletal structures are unremarkable. IMPRESSION: No active disease. Electronically Signed   By: Duanne Guess D.O.   On: 05/09/2020 14:40   CT Angio Chest PE W and/or Wo Contrast  Result Date: 05/09/2020 CLINICAL DATA:  Pt c/o increased BP, heart rate, " tightness in chest " today. Pt is pregnant. EXAM: CT ANGIOGRAPHY CHEST WITH CONTRAST TECHNIQUE: Multidetector CT imaging of the chest was performed using the standard protocol during bolus administration of intravenous contrast. Multiplanar CT image reconstructions and MIPs were obtained to evaluate the vascular  anatomy. CONTRAST:  65 mL OMNIPAQUE IOHEXOL 350 MG/ML SOLN COMPARISON:  None. FINDINGS: Cardiovascular: Satisfactory opacification of the pulmonary arteries to the segmental level. No evidence of pulmonary embolism. Normal heart size. No pericardial effusion. Mediastinum/Nodes: No enlarged mediastinal, hilar, or axillary lymph nodes. Thyroid gland, trachea, and esophagus demonstrate no significant findings. Lungs/Pleura: Lungs are clear. No pleural effusion or pneumothorax. Upper Abdomen: No acute abnormality. Musculoskeletal: No chest wall abnormality. No acute or significant osseous findings. Review of the MIP images confirms the above findings. IMPRESSION: Negative for pulmonary embolus or other acute intrathoracic process. Electronically Signed   By: Emmaline Kluver M.D.   On: 05/09/2020 20:05   MAU Course  Procedures GI cocktail  MDM Labs and imaging ordered. Consistent tachycardia, will order CT to r/o PE. EKG read by Dr. Bjorn Pippin- sinus tachycardia, no abnormalities. No evidence of PE on CT. Unclear cause of tachycardia, TSH ordered. CP improved with gi cocktail. May increase Protonix. No evidence of PEC but meets criteria for gHTN. Stable for discharge home.   Assessment and Plan   1. [redacted] weeks gestation of pregnancy   2. Request for  sterilization   3. Supervision of other normal pregnancy, antepartum   4. Gastroesophageal reflux disease without esophagitis   5. Tachycardia   6. Gestational hypertension, second trimester    Discharge home Follow up at [facility] as scheduled PEC precautions  Allergies as of 05/09/2020      Reactions   Kiwi Extract Swelling   Latex    Other Swelling   Blueberries, blackberries, raspberries   Penicillins       Medication List    STOP taking these medications   butalbital-acetaminophen-caffeine 50-325-40 MG tablet Commonly known as: FIORICET     TAKE these medications   aspirin 81 MG chewable tablet Chew 162 mg by mouth daily.    Blood Pressure Monitor Misc For regular home bp monitoring during pregnancy   multivitamin-prenatal 27-0.8 MG Tabs tablet Take 1 tablet by mouth daily at 12 noon.   pantoprazole 20 MG tablet Commonly known as: Protonix Take 1 tablet (20 mg total) by mouth daily.      Donette Larry, CNM 05/09/2020, 8:32 PM

## 2020-05-09 NOTE — MAU Note (Signed)
Pt stated she took her b/p at [facility] and it was 150/93 has some sharp chest pains. Called her doctor and took her b/p while she was on the phone and it was a little higher/ Told to come in. Reports she has had a cold for the past few days but MD told her it was not covid no need to be tested. (feeling better today.) Report a headache earlier but has gone away.  Reports some round ligament pain that she has had no change. Fetal movement felt.

## 2020-05-09 NOTE — Telephone Encounter (Signed)
Pt called stating she is light headed, had just got out of car and went inside did BP it was 140/86, pulse 126

## 2020-05-12 ENCOUNTER — Other Ambulatory Visit: Payer: Self-pay

## 2020-05-12 ENCOUNTER — Encounter (HOSPITAL_COMMUNITY): Payer: Self-pay | Admitting: Obstetrics & Gynecology

## 2020-05-12 ENCOUNTER — Inpatient Hospital Stay (HOSPITAL_COMMUNITY)
Admission: AD | Admit: 2020-05-12 | Discharge: 2020-05-12 | Disposition: A | Discharge status: Home / Self Care | Payer: Medicaid Other | Attending: Obstetrics & Gynecology | Admitting: Obstetrics & Gynecology

## 2020-05-12 ENCOUNTER — Telehealth: Payer: Self-pay | Admitting: *Deleted

## 2020-05-12 DIAGNOSIS — Z711 Person with feared health complaint in whom no diagnosis is made: Secondary | ICD-10-CM

## 2020-05-12 DIAGNOSIS — O26899 Other specified pregnancy related conditions, unspecified trimester: Secondary | ICD-10-CM | POA: Diagnosis not present

## 2020-05-12 DIAGNOSIS — R519 Headache, unspecified: Secondary | ICD-10-CM | POA: Diagnosis not present

## 2020-05-12 DIAGNOSIS — O26892 Other specified pregnancy related conditions, second trimester: Secondary | ICD-10-CM | POA: Diagnosis not present

## 2020-05-12 DIAGNOSIS — Z3A26 26 weeks gestation of pregnancy: Secondary | ICD-10-CM

## 2020-05-12 NOTE — Telephone Encounter (Signed)
Erroneous encounter

## 2020-05-12 NOTE — MAU Note (Signed)
Pt reports she took blood pressure at home after getting a headache in shower. Her blood pressures at home range from 130's/80's to 140s/98. Pt denies visual changes. +FM. Denies CTX or LOF.  She states she took extended release tylenol, her headache is now 2/10.  Pt reports she was here the other day for BP and chest pain.

## 2020-05-12 NOTE — Discharge Instructions (Signed)

## 2020-05-12 NOTE — MAU Provider Note (Signed)
History     CSN: [id number]  Arrival date and time: 05/12/20 5009   First Provider Initiated Contact with Patient 05/12/20 0147      Chief Complaint  Patient presents with  . Hypertension   HPI Melanie Russo is a 24 y.o. G2P1001 at [redacted]w[redacted]d who presents for elevated BPs. She states she noticed she had a headache when she got out of the shower so she took her BP and found it was 120s/90s. She took tylenol for her headache which has now resolved. She denies any visual changes or epigastric pain. She denies any abdominal pain, vaginal bleeding or leaking of fluid. She reports normal fetal movement. She was seen on 10/1 and evaluated for possible preeclampsia and had normal labs.   OB History    Gravida  2   Para  1   Term  1   Preterm      AB      Living  1     SAB      TAB      Ectopic      Multiple      Live Births  1           Past Medical History:  Diagnosis Date  . Arthritis   . Hypertension    gestational   . Sjogren's syndrome [facility])     Past Surgical History:  Procedure Laterality Date  . NO PAST SURGERIES      Family History  Problem Relation Age of Onset  . Hypertension Father   . Arthritis Father   . Obesity Maternal Uncle   . Cancer Maternal Grandmother        brain  . Heart disease Paternal Grandmother   . Hypertension Paternal Grandmother   . Thyroid disease Paternal Grandmother   . Thyroid disease Paternal Grandfather     Social History   Tobacco Use  . Smoking status: Never Smoker  . Smokeless tobacco: Never Used  Vaping Use  . Vaping Use: Never used  Substance Use Topics  . Alcohol use: Not Currently    Comment: rarely  . Drug use: Never    Allergies:  Allergies  Allergen Reactions  . Kiwi Extract Swelling  . Latex   . Other Swelling    Blueberries, blackberries, raspberries  . Penicillins     Medications Prior to Admission  Medication Sig Dispense Refill Last Dose  . aspirin 81 MG chewable tablet Chew 162 mg  by mouth daily.     . Blood Pressure Monitor MISC For regular home bp monitoring during pregnancy (Patient not taking: Reported on 05/07/2020) 1 each 0   . pantoprazole (PROTONIX) 20 MG tablet Take 1 tablet (20 mg total) by mouth daily. 30 tablet 6   . Prenatal Vit-Fe Fumarate-FA (MULTIVITAMIN-PRENATAL) 27-0.8 MG TABS tablet Take 1 tablet by mouth daily at 12 noon.       Review of Systems  Constitutional: Negative.  Negative for fatigue and fever.  HENT: Negative.   Respiratory: Negative.  Negative for shortness of breath.   Cardiovascular: Negative.  Negative for chest pain.  Gastrointestinal: Negative.  Negative for abdominal pain, constipation, diarrhea, nausea and vomiting.  Genitourinary: Negative.  Negative for dysuria.  Neurological: Negative.  Negative for dizziness and headaches.   Physical Exam   Blood pressure 105/86, pulse (!) 117, temperature 98.3 F (36.8 C), temperature source Oral, resp. rate 18, height 5\' 5"  (1.651 m), weight 111.1 kg, last menstrual period 11/08/2019, SpO2 95 %.  Patient Vitals  for the past 24 hrs:  BP Temp Temp src Pulse Resp SpO2 Height Weight  05/12/20 0140 105/86 -- -- (!) 117 -- 95 % -- --  05/12/20 0122 123/68 98.3 F (36.8 C) Oral (!) 111 18 98 % 5\' 5"  (1.651 m) 111.1 kg  05/12/20 0118 -- -- -- -- -- 98 % -- --   Physical Exam Vitals and nursing note reviewed.  Constitutional:      General: She is not in acute distress.    Appearance: She is well-developed.  HENT:     Head: Normocephalic.  Eyes:     Pupils: Pupils are equal, round, and reactive to light.  Cardiovascular:     Rate and Rhythm: Normal rate and regular rhythm.     Heart sounds: Normal heart sounds.  Pulmonary:     Effort: Pulmonary effort is normal. No respiratory distress.     Breath sounds: Normal breath sounds.  Abdominal:     General: Bowel sounds are normal. There is no distension.     Palpations: Abdomen is soft.     Tenderness: There is no abdominal tenderness.   Skin:    General: Skin is warm and dry.  Neurological:     Mental Status: She is alert and oriented to person, place, and time.     Motor: No abnormal muscle tone.     Coordination: Coordination normal.     Deep Tendon Reflexes: Reflexes are normal and symmetric. Reflexes normal.  Psychiatric:        Behavior: Behavior normal.        Thought Content: Thought content normal.        Judgment: Judgment normal.    Fetal Tracing:  Baseline: 140 Variability: moderate Accels: none Decels: none  Toco: none  MAU Course  Procedures  MDM Patient normotensive in MAU and without symptoms of preeclampsia. Work up on 10/1 negative. Per chart review, likely CHTN due to elevated BPs prior to 20 weeks.  Patient very anxious and feels this could be contributing to elevated BPs. Reviewed preeclampsia at length with patient and what to expect.   Assessment and Plan   1. Pregnancy headache, antepartum   2. [redacted] weeks gestation of pregnancy   3. Physically well but worried    -Discharge home in stable condition -Preeclampsia precautions discussed -Patient advised to follow-up with OB as scheduled for prenatal care -Patient may return to MAU as needed or if her condition were to change or worsen   07/12/20 CNM 05/12/2020, 1:47 AM

## 2020-05-19 ENCOUNTER — Encounter: Payer: Self-pay | Admitting: *Deleted

## 2020-05-19 DIAGNOSIS — F411 Generalized anxiety disorder: Secondary | ICD-10-CM | POA: Diagnosis not present

## 2020-05-25 DIAGNOSIS — O26893 Other specified pregnancy related conditions, third trimester: Secondary | ICD-10-CM | POA: Diagnosis not present

## 2020-05-25 DIAGNOSIS — Z3A28 28 weeks gestation of pregnancy: Secondary | ICD-10-CM | POA: Diagnosis not present

## 2020-05-26 DIAGNOSIS — Z3A28 28 weeks gestation of pregnancy: Secondary | ICD-10-CM | POA: Diagnosis not present

## 2020-05-26 DIAGNOSIS — O26893 Other specified pregnancy related conditions, third trimester: Secondary | ICD-10-CM | POA: Diagnosis not present

## 2020-05-28 ENCOUNTER — Encounter: Payer: Medicaid Other | Admitting: Advanced Practice Midwife

## 2020-05-28 ENCOUNTER — Other Ambulatory Visit: Payer: Medicaid Other

## 2020-06-02 DIAGNOSIS — F411 Generalized anxiety disorder: Secondary | ICD-10-CM | POA: Diagnosis not present

## 2020-06-09 ENCOUNTER — Other Ambulatory Visit: Payer: Self-pay | Admitting: Advanced Practice Midwife

## 2020-06-09 MED ORDER — ONDANSETRON 4 MG PO TBDP: 4.0000 mg | ORAL_TABLET | Freq: Four times a day (QID) | ORAL | 2 refills | Status: DC | PRN

## 2020-06-09 NOTE — Progress Notes (Signed)
zofran for nausea.

## 2020-06-18 DIAGNOSIS — F411 Generalized anxiety disorder: Secondary | ICD-10-CM | POA: Diagnosis not present

## 2020-06-19 ENCOUNTER — Other Ambulatory Visit: Payer: Self-pay | Admitting: Obstetrics & Gynecology

## 2020-06-19 ENCOUNTER — Telehealth: Payer: Self-pay | Admitting: Obstetrics & Gynecology

## 2020-06-19 DIAGNOSIS — O133 Gestational [pregnancy-induced] hypertension without significant proteinuria, third trimester: Secondary | ICD-10-CM

## 2020-06-19 NOTE — Telephone Encounter (Signed)
Pt called concerned because someone at [facility] took butalbital-acetaminophen-caffeine  Off of her med list, states that she was told this was safe to take during pregnancy & she only uses it with a bad headache She's worried because it was taken off of her med list, states she wants it placed back on her med list & she does not need a refill   Please advise & notify pt

## 2020-06-20 ENCOUNTER — Encounter: Payer: Self-pay | Admitting: Advanced Practice Midwife

## 2020-06-20 ENCOUNTER — Other Ambulatory Visit: Payer: Medicaid Other

## 2020-06-20 ENCOUNTER — Ambulatory Visit (INDEPENDENT_AMBULATORY_CARE_PROVIDER_SITE_OTHER): Payer: Medicaid Other

## 2020-06-20 ENCOUNTER — Ambulatory Visit (INDEPENDENT_AMBULATORY_CARE_PROVIDER_SITE_OTHER): Payer: Medicaid Other | Admitting: Advanced Practice Midwife

## 2020-06-20 VITALS — BP 112/82 | HR 154 | Wt 246.5 lb

## 2020-06-20 DIAGNOSIS — Z3A32 32 weeks gestation of pregnancy: Secondary | ICD-10-CM

## 2020-06-20 DIAGNOSIS — O133 Gestational [pregnancy-induced] hypertension without significant proteinuria, third trimester: Secondary | ICD-10-CM

## 2020-06-20 DIAGNOSIS — Z3480 Encounter for supervision of other normal pregnancy, unspecified trimester: Secondary | ICD-10-CM

## 2020-06-20 DIAGNOSIS — Z23 Encounter for immunization: Secondary | ICD-10-CM

## 2020-06-20 DIAGNOSIS — Z302 Encounter for sterilization: Secondary | ICD-10-CM

## 2020-06-20 DIAGNOSIS — Z1389 Encounter for screening for other disorder: Secondary | ICD-10-CM

## 2020-06-20 DIAGNOSIS — O0993 Supervision of high risk pregnancy, unspecified, third trimester: Secondary | ICD-10-CM

## 2020-06-20 DIAGNOSIS — Z331 Pregnant state, incidental: Secondary | ICD-10-CM

## 2020-06-20 LAB — POCT URINALYSIS DIPSTICK OB
Blood, UA: NEGATIVE
Glucose, UA: NEGATIVE
Leukocytes, UA: NEGATIVE
Nitrite, UA: NEGATIVE
POC,PROTEIN,UA: NEGATIVE

## 2020-06-20 NOTE — Progress Notes (Signed)
HIGH-RISK PREGNANCY VISIT Patient name: Melanie Russo MRN [id number]  Date of birth: 12-Jun-1996 Chief Complaint:   Routine Prenatal Visit (PN2 & [place] today; headaches @ night, nausea with headache; lightheaded )  History of Present Illness:   Melanie Russo is a 24 y.o. G80P1001 female at [redacted]w[redacted]d with an Estimated Date of Delivery: 08/14/20 being seen today for ongoing management of a high-risk pregnancy complicated by gestational hypertension currently on no meds. Was dx in MAU at 26wks. Hasn't been to FT since 25.6wk visit (her son was sick). Has been having higher BPs at home as well as tachycardia (nl EKG, CT, neg pre-e labs, nl TSH at 26wks)  Today she reports continuing with intermittent chest tightness and tachycardia at home.  Depression screen [facility] 2/9 06/20/2020 02/05/2020 12/14/2019  Decreased Interest 0 1 2  Down, Depressed, Hopeless 1 1 0  PHQ - 2 Score 1 2 2   Altered sleeping 3 3 3   Tired, decreased energy 3 3 3   Change in appetite 0 3 1  Feeling bad or failure about yourself  0 1 0  Trouble concentrating 0 1 2  Moving slowly or fidgety/restless 0 0 0  Suicidal thoughts 0 0 0  PHQ-9 Score 7 13 11   Difficult doing work/chores - - Not difficult at all    Contractions: Not present. Vag. Bleeding: None.  Movement: Present. denies leaking of fluid.  Review of Systems:   Pertinent items are noted in HPI Denies abnormal vaginal discharge w/ itching/odor/irritation, headaches, visual changes, shortness of breath, chest pain, abdominal pain, severe nausea/vomiting, or problems with urination or bowel movements unless otherwise stated above. Pertinent History Reviewed:  Reviewed past medical,surgical, social, obstetrical and family history.  Reviewed problem list, medications and allergies. Physical Assessment:   Vitals:   06/20/20 1038  BP: 112/82  Pulse: (!) 154  Weight: 246 lb 8 oz (111.8 kg)  Body mass index is 41.02 kg/m.           Physical Examination:   General  appearance: alert, well appearing, and in no distress  Mental status: alert, oriented to person, place, and time  Skin: warm & dry   Extremities: Edema: Trace    Cardiovascular: normal heart rate noted  Respiratory: normal respiratory effort, no distress  Abdomen: gravid, soft, non-tender  Pelvic: Cervical exam deferred         Fetal Status:     Movement: Present    Fetal Surveillance Testing today: 32+1 wks,cephalic,posterior placenta gr 1,efw 2051 g 60%,BPP 8/8,FHR 154 BPM,AFI 11.6 cm,RI .57,.65,.58=45%   Results for orders placed or performed in visit on 06/20/20 (from the past 24 hour(s))  POC Urinalysis Dipstick OB   Collection Time: 06/20/20 10:41 AM  Result Value Ref Range   Color, UA     Clarity, UA     Glucose, UA Negative Negative   Bilirubin, UA     Ketones, UA trace    Spec Grav, UA     Blood, UA neg    pH, UA     POC,PROTEIN,UA Negative Negative, Trace, Small (1+), Moderate (2+), Large (3+), 4+   Urobilinogen, UA     Nitrite, UA neg    Leukocytes, UA Negative Negative   Appearance     Odor      Assessment & Plan:  1) High-risk pregnancy G2P1001 at [redacted]w[redacted]d with an Estimated Date of Delivery: 08/14/20   2) gHTN, normal BP today, reports higher values at home occasionally; no s/s pre-e  3) Sinus  tachycardia, had neg work-up in MAU on 05/09/20 including EKG, CT and TSH- all nl; note routed to Tish to sched with cards for extended eval  4) Desires salpingectomy, sign 30d papers today  Meds: No orders of the defined types were placed in this encounter.   Labs/procedures today: EFW/dopplers/BPP, PN2, Tdap  Treatment Plan:  Begin 2x/wk testing next week with every other week HROB visit; EFW @ 36wks; referral to cards for ST eval (msg sent to Tish)  Reviewed: Preterm labor symptoms and general obstetric precautions including but not limited to vaginal bleeding, contractions, leaking of fluid and fetal movement were reviewed in detail with the patient.  All questions  were answered. Has home bp cuff. Check bp weekly, let us know if >140/90.   Follow-up: Return in about 2 weeks (around 07/04/2020) for Sign BTL consent today; schedule 2x/wk NSTs starting next week with HROB visit every other week x5wk.  Orders Placed This Encounter  Procedures  . Tdap vaccine greater than or equal to 7yo IM  . POC Urinalysis Dipstick OB   Arabella Merles [facility] 06/20/2020 11:45 AM

## 2020-06-20 NOTE — Patient Instructions (Signed)
Levora Angel, I greatly value your feedback.  If you receive a survey following your visit with [place] today, we appreciate you taking the time to fill it out.  Thanks, Philipp Deputy CNM   [facility] ([address] [place], [place] [address]) Entrance C, located off of [address] valet parking  Go to [email or web address] to register for FREE online childbirth classes   Call the office [phone]) or go to [facility] if:  You begin to have strong, frequent contractions  Your water breaks.  Sometimes it is a big gush of fluid, sometimes it is just a trickle that keeps getting your panties wet or running down your legs  You have vaginal bleeding.  It is normal to have a small amount of spotting if your cervix was checked.   You don't feel your baby moving like normal.  If you don't, get you something to eat and drink and lay down and focus on feeling your baby move.  You should feel at least 10 movements in 2 hours.  If you don't, you should call the office or go to [facility]    Tdap Vaccine  It is recommended that you get the Tdap vaccine during the third trimester of EACH pregnancy to help protect your baby from getting pertussis (whooping cough)  27-36 weeks is the BEST time to do this so that you can pass the protection on to your baby. During pregnancy is better than after pregnancy, but if you are unable to get it during pregnancy it will be offered at the hospital.   You can get this vaccine with [place], at the health department, your family doctor, or some local pharmacies  Everyone who will be around your baby should also be up-to-date on their vaccines before the baby comes. Adults (who are not pregnant) only need 1 dose of Tdap during adulthood.   [facility] [facility]ians/Family Doctors:  [facility] [phone]            [facility] [phone]                 [facility] [phone]  (usually not accepting new patients unless you have family there already, you are always welcome to call and ask)       [facility] [phone]       [facility] Pediatricians/Family Doctors:   [facility]: [phone]  [facility]: [phone]  [facility]: [phone]  [facility] Doctors:   [facility]: [phone]   [facility]: [phone]  [facility] Doctors:  [facility]: [phone]   Home Blood Pressure Monitoring for Patients   Your provider has recommended that you check your blood pressure (BP) at least once a week at home. If you do not have a blood pressure cuff at home, one will be provided for you. Contact your provider if you have not received your monitor within 1 week.   Helpful Tips for Accurate Home Blood Pressure Checks  . Don't smoke, exercise, or drink caffeine 30 minutes before checking your BP . Use the restroom before checking your BP (a full bladder can raise your pressure) . Relax in a comfortable upright chair . Feet on the ground . Left arm resting comfortably on a flat surface at the level of your heart . Legs uncrossed . Back supported . Sit quietly and don't talk . Place the cuff on your bare arm .  Adjust snuggly, so that only two fingertips can fit between your skin and the top of the cuff . Check 2 readings separated by at least one minute . Keep a log of your BP readings . For a visual, please reference this diagram: http://ccnc.care/bpdiagram  Provider Name: [facility]     Phone: [phone]  Zone 1: ALL CLEAR  Continue to monitor your symptoms:  . BP reading is less than 140 (top number) or less than 90 (bottom number)  . No right upper stomach pain . No headaches or seeing spots . No feeling nauseated or throwing up . No swelling in face and hands  Zone 2: CAUTION Call your doctor's office for  any of the following:  . BP reading is greater than 140 (top number) or greater than 90 (bottom number)  . Stomach pain under your ribs in the middle or right side . Headaches or seeing spots . Feeling nauseated or throwing up . Swelling in face and hands  Zone 3: EMERGENCY  Seek immediate medical care if you have any of the following:  . BP reading is greater than160 (top number) or greater than 110 (bottom number) . Severe headaches not improving with Tylenol . Serious difficulty catching your breath . Any worsening symptoms from Zone 2   Third Trimester of Pregnancy The third trimester is from week 29 through week 42, months 7 through 9. The third trimester is a time when the fetus is growing rapidly. At the end of the ninth month, the fetus is about 20 inches in length and weighs 6-10 pounds.  BODY CHANGES Your body goes through many changes during pregnancy. The changes vary from woman to woman.   Your weight will continue to increase. You can expect to gain 25-35 pounds (11-16 kg) by the end of the pregnancy.  You may begin to get stretch marks on your hips, abdomen, and breasts.  You may urinate more often because the fetus is moving lower into your pelvis and pressing on your bladder.  You may develop or continue to have heartburn as a result of your pregnancy.  You may develop constipation because certain hormones are causing the muscles that push waste through your intestines to slow down.  You may develop hemorrhoids or swollen, bulging veins (varicose veins).  You may have pelvic pain because of the weight gain and pregnancy hormones relaxing your joints between the bones in your pelvis. Backaches may result from overexertion of the muscles supporting your posture.  You may have changes in your hair. These can include thickening of your hair, rapid growth, and changes in texture. Some women also have hair loss during or after pregnancy, or hair that feels dry or thin.  Your hair will most likely return to normal after your baby is born.  Your breasts will continue to grow and be tender. A yellow discharge may leak from your breasts called colostrum.  Your belly button may stick out.  You may feel short of breath because of your expanding uterus.  You may notice the fetus "dropping," or moving lower in your abdomen.  You may have a bloody mucus discharge. This usually occurs a few days to a week before labor begins.  Your cervix becomes thin and soft (effaced) near your due date. WHAT TO EXPECT AT YOUR PRENATAL EXAMS  You will have prenatal exams every 2 weeks until week 36. Then, you will have weekly prenatal exams. During a routine prenatal visit:  You will be  weighed to make sure you and the fetus are growing normally.  Your blood pressure is taken.  Your abdomen will be measured to track your baby's growth.  The fetal heartbeat will be listened to.  Any test results from the previous visit will be discussed.  You may have a cervical check near your due date to see if you have effaced. At around 36 weeks, your caregiver will check your cervix. At the same time, your caregiver will also perform a test on the secretions of the vaginal tissue. This test is to determine if a type of bacteria, Group B streptococcus, is present. Your caregiver will explain this further. Your caregiver may ask you:  What your birth plan is.  How you are feeling.  If you are feeling the baby move.  If you have had any abnormal symptoms, such as leaking fluid, bleeding, severe headaches, or abdominal cramping.  If you have any questions. Other tests or screenings that may be performed during your third trimester include:  Blood tests that check for low iron levels (anemia).  Fetal testing to check the health, activity level, and growth of the fetus. Testing is done if you have certain medical conditions or if there are problems during the pregnancy. FALSE  LABOR You may feel small, irregular contractions that eventually go away. These are called Braxton Hicks contractions, or false labor. Contractions may last for hours, days, or even weeks before true labor sets in. If contractions come at regular intervals, intensify, or become painful, it is best to be seen by your caregiver.  SIGNS OF LABOR   Menstrual-like cramps.  Contractions that are 5 minutes apart or less.  Contractions that start on the top of the uterus and spread down to the lower abdomen and back.  A sense of increased pelvic pressure or back pain.  A watery or bloody mucus discharge that comes from the vagina. If you have any of these signs before the 37th week of pregnancy, call your caregiver right away. You need to go to the hospital to get checked immediately. HOME CARE INSTRUCTIONS   Avoid all smoking, herbs, alcohol, and unprescribed drugs. These chemicals affect the formation and growth of the baby.  Follow your caregiver's instructions regarding medicine use. There are medicines that are either safe or unsafe to take during pregnancy.  Exercise only as directed by your caregiver. Experiencing uterine cramps is a good sign to stop exercising.  Continue to eat regular, healthy meals.  Wear a good support bra for breast tenderness.  Do not use hot tubs, steam rooms, or saunas.  Wear your seat belt at all times when driving.  Avoid raw meat, uncooked cheese, cat litter boxes, and soil used by cats. These carry germs that can cause birth defects in the baby.  Take your prenatal vitamins.  Try taking a stool softener (if your caregiver approves) if you develop constipation. Eat more high-fiber foods, such as fresh vegetables or fruit and whole grains. Drink plenty of fluids to keep your urine clear or pale yellow.  Take warm sitz baths to soothe any pain or discomfort caused by hemorrhoids. Use hemorrhoid cream if your caregiver approves.  If you develop varicose  veins, wear support hose. Elevate your feet for 15 minutes, 3-4 times a day. Limit salt in your diet.  Avoid heavy lifting, wear low heal shoes, and practice good posture.  Rest a lot with your legs elevated if you have leg cramps or low back pain.  Visit your dentist if you have not gone during your pregnancy. Use a soft toothbrush to brush your teeth and be gentle when you floss.  A sexual relationship may be continued unless your caregiver directs you otherwise.  Do not travel far distances unless it is absolutely necessary and only with the approval of your caregiver.  Take prenatal classes to understand, practice, and ask questions about the labor and delivery.  Make a trial run to the hospital.  Pack your hospital bag.  Prepare the baby's nursery.  Continue to go to all your prenatal visits as directed by your caregiver. SEEK MEDICAL CARE IF:  You are unsure if you are in labor or if your water has broken.  You have dizziness.  You have mild pelvic cramps, pelvic pressure, or nagging pain in your abdominal area.  You have persistent nausea, vomiting, or diarrhea.  You have a bad smelling vaginal discharge.  You have pain with urination. SEEK IMMEDIATE MEDICAL CARE IF:   You have a fever.  You are leaking fluid from your vagina.  You have spotting or bleeding from your vagina.  You have severe abdominal cramping or pain.  You have rapid weight loss or gain.  You have shortness of breath with chest pain.  You notice sudden or extreme swelling of your face, hands, ankles, feet, or legs.  You have not felt your baby move in over an hour.  You have severe headaches that do not go away with medicine.  You have vision changes. Document Released: 07/20/2001 Document Revised: 07/31/2013 Document Reviewed: 09/26/2012 [facility] Patient Information 2015 [place], [place]. This information is not intended to replace advice given to you by your health care provider. Make  sure you discuss any questions you have with your health care provider.

## 2020-06-20 NOTE — Progress Notes (Signed)
[place] 32+1 wks,cephalic,posterior placenta gr 1,efw 2051 g 60%,BPP 8/8,FHR 154 BPM,AFI 11.6 cm,RI .57,.65,.58=45%

## 2020-06-21 LAB — GLUCOSE TOLERANCE, 2 HOURS W/ 1HR
Glucose, 1 hour: 172 mg/dL (ref 65–179)
Glucose, 2 hour: 179 mg/dL — ABNORMAL HIGH (ref 65–152)
Glucose, Fasting: 95 mg/dL — ABNORMAL HIGH (ref 65–91)

## 2020-06-21 LAB — CBC
Hematocrit: 33.7 % — ABNORMAL LOW (ref 34.0–46.6)
Hemoglobin: 10.8 g/dL — ABNORMAL LOW (ref 11.1–15.9)
MCH: 26.9 pg (ref 26.6–33.0)
MCHC: 32 g/dL (ref 31.5–35.7)
MCV: 84 fL (ref 79–97)
Platelets: 238 10*3/uL (ref 150–450)
RBC: 4.02 x10E6/uL (ref 3.77–5.28)
RDW: 14.1 % (ref 11.7–15.4)
WBC: 8.4 10*3/uL (ref 3.4–10.8)

## 2020-06-21 LAB — RPR: RPR Ser Ql: NONREACTIVE

## 2020-06-21 LAB — HIV ANTIBODY (ROUTINE TESTING W REFLEX): HIV Screen 4th Generation wRfx: NONREACTIVE

## 2020-06-21 LAB — ANTIBODY SCREEN: Antibody Screen: NEGATIVE

## 2020-06-22 ENCOUNTER — Inpatient Hospital Stay (HOSPITAL_COMMUNITY)
Admission: AD | Admit: 2020-06-22 | Discharge: 2020-06-22 | Disposition: A | Discharge status: Home / Self Care | Payer: Medicaid Other | Attending: Obstetrics & Gynecology | Admitting: Obstetrics & Gynecology

## 2020-06-22 ENCOUNTER — Encounter (HOSPITAL_COMMUNITY): Payer: Self-pay | Admitting: Obstetrics & Gynecology

## 2020-06-22 ENCOUNTER — Other Ambulatory Visit: Payer: Self-pay

## 2020-06-22 DIAGNOSIS — O26893 Other specified pregnancy related conditions, third trimester: Secondary | ICD-10-CM | POA: Insufficient documentation

## 2020-06-22 DIAGNOSIS — R102 Pelvic and perineal pain: Secondary | ICD-10-CM | POA: Diagnosis not present

## 2020-06-22 DIAGNOSIS — Z3A32 32 weeks gestation of pregnancy: Secondary | ICD-10-CM | POA: Diagnosis not present

## 2020-06-22 DIAGNOSIS — O4703 False labor before 37 completed weeks of gestation, third trimester: Secondary | ICD-10-CM | POA: Diagnosis not present

## 2020-06-22 DIAGNOSIS — Z3689 Encounter for other specified antenatal screening: Secondary | ICD-10-CM

## 2020-06-22 LAB — URINALYSIS, ROUTINE W REFLEX MICROSCOPIC
Bilirubin Urine: NEGATIVE
Glucose, UA: NEGATIVE mg/dL
Hgb urine dipstick: NEGATIVE
Ketones, ur: 5 mg/dL — AB
Nitrite: NEGATIVE
Protein, ur: NEGATIVE mg/dL
Specific Gravity, Urine: 1.014 (ref 1.005–1.030)
pH: 6 (ref 5.0–8.0)

## 2020-06-22 LAB — WET PREP, GENITAL
Clue Cells Wet Prep HPF POC: NONE SEEN
Sperm: NONE SEEN
Trich, Wet Prep: NONE SEEN
Yeast Wet Prep HPF POC: NONE SEEN

## 2020-06-22 NOTE — Discharge Instructions (Signed)
Round Ligament Pain  The round ligament is a cord of muscle and tissue that helps support the uterus. It can become a source of pain during pregnancy if it becomes stretched or twisted as the baby grows. The pain usually begins in the second trimester (13-28 weeks) of pregnancy, and it can come and go until the baby is delivered. It is not a serious problem, and it does not cause harm to the baby. Round ligament pain is usually a short, sharp, and pinching pain, but it can also be a dull, lingering, and aching pain. The pain is felt in the lower side of the abdomen or in the groin. It usually starts deep in the groin and moves up to the outside of the hip area. The pain may occur when you:  Suddenly change position, such as quickly going from a sitting to standing position.  Roll over in bed.  Cough or sneeze.  Do physical activity. Follow these instructions at home:   Watch your condition for any changes.  When the pain starts, relax. Then try any of these methods to help with the pain: ? Sitting down. ? Flexing your knees up to your abdomen. ? Lying on your side with one pillow under your abdomen and another pillow between your legs. ? Sitting in a warm bath for 15-20 minutes or until the pain goes away.  Take over-the-counter and prescription medicines only as told by your health care provider.  Move slowly when you sit down or stand up.  Avoid long walks if they cause pain.  Stop or reduce your physical activities if they cause pain.  Keep all follow-up visits as told by your health care provider. This is important. Contact a health care provider if:  Your pain does not go away with treatment.  You feel pain in your back that you did not have before.  Your medicine is not helping. Get help right away if:  You have a fever or chills.  You develop uterine contractions.  You have vaginal bleeding.  You have nausea or vomiting.  You have diarrhea.  You have pain  when you urinate. Summary  Round ligament pain is felt in the lower abdomen or groin. It is usually a short, sharp, and pinching pain. It can also be a dull, lingering, and aching pain.  This pain usually begins in the second trimester (13-28 weeks). It occurs because the uterus is stretching with the growing baby, and it is not harmful to the baby.  You may notice the pain when you suddenly change position, when you cough or sneeze, or during physical activity.  Relaxing, flexing your knees to your abdomen, lying on one side, or taking a warm bath may help to get rid of the pain.  Get help from your health care provider if the pain does not go away or if you have vaginal bleeding, nausea, vomiting, diarrhea, or painful urination. This information is not intended to replace advice given to you by your health care provider. Make sure you discuss any questions you have with your health care provider. Document Revised: 01/11/2018 Document Reviewed: 01/11/2018 Elsevier Patient Education  [place].   Gestational Diabetes Mellitus, Diagnosis Gestational diabetes (gestational diabetes mellitus) is a short-term (temporary) form of diabetes that can happen during pregnancy. It goes away after you give birth. It may be caused by one or both of these problems:  Your pancreas does not make enough of a hormone called insulin.  Your body  does not respond in a normal way to insulin that it makes. Insulin lets sugars (glucose) go into cells in the body. This gives you energy. If you have diabetes, sugars cannot get into cells. This causes high blood sugar (hyperglycemia). If you get gestational diabetes, you are:  More likely to get it if you get pregnant again.  More likely to develop type 2 diabetes in the future. If gestational diabetes is treated, it may not hurt you or your baby. Your doctor will set treatment goals for you. In general, you should have these blood sugar levels:  After  not eating for a long time (fasting): 95 mg/dL (5.3 mmol/L).  After meals (postprandial): ? One hour after a meal: at or below 140 mg/dL (7.8 mmol/L). ? Two hours after a meal: at or below 120 mg/dL (6.7 mmol/L).  A1c (hemoglobin A1c) level: 6-6.5%. Follow these instructions at home: Questions to ask your doctor   You may want to ask these questions: ? Do I need to meet with a diabetes educator? ? What equipment will I need to care for myself at home? ? What medicines do I need? When should I take them? ? How often do I need to check my blood sugar? ? What number can I call if I have questions? ? When is my next doctor's visit? General instructions  Take over-the-counter and prescription medicines only as told by your doctor.  Stay at a healthy weight during pregnancy.  Keep all follow-up visits as told by your doctor. This is important. Contact a doctor if:  Your blood sugar is at or above 240 mg/dL (13.3 mmol/L).  Your blood sugar is at or above 200 mg/dL (11.1 mmol/L) and you have ketones in your pee (urine).  You have been sick or have had a fever for 2 days or more and you are not getting better.  You have any of these problems for more than 6 hours: ? You cannot eat or drink. ? You feel sick to your stomach (nauseous). ? You throw up (vomit). ? You have watery poop (diarrhea). Get help right away if:  Your blood sugar is lower than 54 mg/dL (3 mmol/L).  You get confused.  You have trouble: ? Thinking clearly. ? Breathing.  Your baby moves less than normal.  You have any of these: ? Moderate or large ketone levels in your pee. ? Blood coming from your vagina. ? Unusual fluid coming from your vagina. ? Early contractions. These may feel like tightness in your belly. Summary  Gestational diabetes is a short-term form of diabetes. It can happen while you are pregnant. It goes away after you give birth.  If gestational diabetes is treated, it may not hurt  you or your baby. Your doctor will set treatment goals for you.  Keep all follow-up visits as told by your doctor. This is important. This information is not intended to replace advice given to you by your health care provider. Make sure you discuss any questions you have with your health care provider. Document Revised: 09/01/2017 Document Reviewed: 08/29/2015 Elsevier Patient Education  [place].  Gestational Diabetes Mellitus, Self Care Caring for yourself after you have been diagnosed with gestational diabetes (gestational diabetes mellitus) means keeping your blood sugar (glucose) under control. You can do that with a balance of:  Nutrition.  Exercise.  Lifestyle changes.  Medicines or insulin, if necessary.  Support from your team of health care providers and others. The following information explains  what you need to know to manage your gestational diabetes at home. What are the risks? If gestational diabetes is treated, it is unlikely to cause problems. If it is not controlled with treatment, it may cause problems during labor and delivery, and some of those problems can be harmful to the unborn baby (fetus) and the mother. Uncontrolled gestational diabetes may also cause the newborn baby to have breathing problems and low blood glucose. Women who get gestational diabetes are more likely to develop it if they get pregnant again, and they are more likely to develop type 2 diabetes in the future. How to monitor blood glucose   Check your blood glucose every day during your pregnancy. Do this as often as told by your health care provider.  Contact your health care provider if your blood glucose is above your target for two tests in a row. Your health care provider will set individualized treatment goals for you. Generally, the goal of treatment is to maintain the following blood glucose levels during pregnancy:  Before meals (preprandial): at or below 95 mg/dL (5.3  mmol/L).  After meals (postprandial): ? One hour after a meal: at or below 140 mg/dL (7.8 mmol/L). ? Two hours after a meal: at or below 120 mg/dL (6.7 mmol/L).  A1c (hemoglobin A1c) level: 6-6.5%. How to manage hyperglycemia and hypoglycemia Hyperglycemia symptoms Hyperglycemia, also called high blood glucose, occurs when blood glucose is too high. Make sure you know the early signs of hyperglycemia, such as:  Increased thirst.  Hunger.  Feeling very tired.  Needing to urinate more often than usual.  Blurry vision. Hypoglycemia symptoms Hypoglycemia, also called low blood glucose, occurs with a blood glucose level at or below 70 mg/dL (3.9 mmol/L). The risk for hypoglycemia increases during or after exercise, during sleep, during illness, and when skipping meals or not eating for a long time (fasting). Symptoms may include:  Hunger.  Anxiety.  Sweating and feeling clammy.  Confusion.  Dizziness or feeling light-headed.  Sleepiness.  Nausea.  Increased heart rate.  Headache.  Blurry vision.  Irritability.  Tingling or numbness around the mouth, lips, or tongue.  A change in coordination.  Restless sleep.  Fainting.  Seizure. It is important to know the symptoms of hypoglycemia and treat it right away. Always have a 15-gram rapid-acting carbohydrate snack with you to treat low blood glucose. Family members and close friends should also know the symptoms and should understand how to treat hypoglycemia, in case you are not able to treat yourself. Treating hypoglycemia If you are alert and able to swallow safely, follow the 15:15 rule:  Take 15 grams of a rapid-acting carbohydrate. Talk with your health care provider about how much you should take.  Rapid-acting options include: ? Glucose pills (take 15 grams). ? 6-8 pieces of hard candy. ? 4-6 oz (120-150 mL) of fruit juice. ? 4-6 oz (120-150 mL) of regular (not diet) soda. ? 1 Tbsp (15 mL) honey or  sugar.  Check your blood glucose 15 minutes after you take the carbohydrate.  If the repeat blood glucose level is still at or below 70 mg/dL (3.9 mmol/L), take 15 grams of a carbohydrate again.  If your blood glucose level does not increase above 70 mg/dL (3.9 mmol/L) after 3 tries, seek emergency medical care.  After your blood glucose level returns to normal, eat a meal or a snack within 1 hour. Treating severe hypoglycemia Severe hypoglycemia is when your blood glucose level is at or below  54 mg/dL (3 mmol/L). Severe hypoglycemia is an emergency. Do not wait to see if the symptoms will go away. Get medical help right away. Call your local emergency services (911 in the [place] If you have severe hypoglycemia and you cannot eat or drink, you may need an injection of glucagon. A family member or close friend should learn how to check your blood glucose and how to give you a glucagon injection. Ask your health care provider if you need to have an emergency glucagon injection kit available. Severe hypoglycemia may need to be treated in a hospital. The treatment may include getting glucose through an IV. You may also need treatment for the cause of your hypoglycemia. Follow these instructions at home: Take diabetes medicines as told  If your health care provider prescribed insulin or diabetes medicines, take them every day.  Do not run out of insulin or other diabetes medicines that you take. Plan ahead so you always have these available.  If you use insulin, adjust your dosage based on how physically active you are and what foods you eat. Your health care provider will tell you how to adjust your dosage. Make healthy food choices  The things that you eat and drink affect your blood glucose (and your insulin dose, if this applies). Making good choices helps to control your diabetes and prevent other health problems. A healthy meal plan includes eating lean proteins, complex carbohydrates, fresh  fruits and vegetables, low-fat dairy products, and healthy fats. Make an appointment to see a diet and nutrition specialist (registered dietitian) to help you create an eating plan that is right for you. Make sure that you:  Follow instructions from your health care provider about eating or drinking restrictions.  Drink enough fluid to keep your urine pale yellow.  Eat healthy snacks between nutritious meals.  Keep a record of the carbohydrates that you eat. Do this by reading food labels and learning the standard serving sizes of foods.  Follow your sick day plan whenever you cannot eat or drink as usual. Make this plan in advance with your health care provider.  Stay active  Do 30 or more minutes of physical activity a day, or as much physical activity as your health care provider recommends during your pregnancy. ? Doing 10 minutes of exercise starting 30 minutes after each meal may help to control postprandial blood glucose levels.  If you start a new exercise or activity, work with your health care provider to adjust your insulin, medicines, or food intake as needed. Make healthy lifestyle choices  Do not drink alcohol.  Do not use any tobacco products, such as cigarettes, chewing tobacco, and e-cigarettes. If you need help quitting, ask your health care provider.  Learn to manage stress. If you need help with this, ask your health care provider. Care for your body  Keep your immunizations up to date.  Brush your teeth and gums two times a day, and floss one or more times a day. Visit your dentist one or more times every 6 months.  Maintain a healthy weight during your pregnancy. General instructions  Take over-the-counter and prescription medicines only as told by your health care provider.  Talk with your health care provider about your risk for high blood pressure during pregnancy (preeclampsia or eclampsia).  Share your diabetes management plan with people in your  workplace, school, and household.  Check your urine for ketones during your pregnancy when you are ill and as told by your health  care provider.  Carry a medical alert card or wear medical alert jewelry that says you have gestational diabetes.  Keep all follow-up visits during your pregnancy (prenatal) and after delivery (postnatal) as told by your health care provider. This is important. Get the care that you need after delivery  Have your blood glucose level checked 4-12 weeks after delivery. This is done with an oral glucose tolerance test (OGTT).  Get screened for diabetes at least every 3 years, or as often as told by your health care provider. Questions to ask your health care provider  Do I need to meet with a diabetes educator?  Where can I find a support group for people with gestational diabetes? Where to find more information For more information about gestational diabetes, visit:  American Diabetes Association (ADA): www.diabetes.org  Centers for Disease Control and Prevention (CDC): http://www.wolf.info/ Summary  Check your blood glucose every day during your pregnancy. Do this as often as told by your health care provider.  If your health care provider prescribed insulin or diabetes medicines, take them every day as told.  Keep all follow-up visits during your pregnancy (prenatal) and after delivery (postnatal) as told by your health care provider. This is important.  Have your blood glucose level checked 4-12 weeks after delivery. This information is not intended to replace advice given to you by your health care provider. Make sure you discuss any questions you have with your health care provider. Document Revised: 01/16/2018 Document Reviewed: 08/29/2015 Elsevier Patient Education  2020 [employer].

## 2020-06-22 NOTE — MAU Provider Note (Signed)
First Provider Initiated Contact with Patient 06/22/20 2115     S: Ms. Melanie Russo is a 24 y.o. G2P1001 at [redacted]w[redacted]d  who presents to MAU today complaining of pelvic (described as "bad period cramps") and lower back pain for two hours this afternoon. It stopped once she got in the car to come to MAU, but she came just to be sure she wasn't dilating.  She denies vaginal bleeding. She denies LOF. She reports normal fetal movement.  Pt mentioned abnormal GTT results from her appt on 06/20/20 and that she has not spoken to a provider yet. She goes to [facility].  O: BP 122/60 (BP Location: Right Arm)   Pulse (!) 128   Temp 98.5 F (36.9 C) (Oral)   Resp 16   Ht 5\' 5"  (1.651 m)   Wt 248 lb 6.4 oz (112.7 kg)   LMP 11/08/2019 (Exact Date)   SpO2 99%   BMI 41.34 kg/m  GENERAL: Well-developed, well-nourished female in no acute distress.  HEAD: Normocephalic, atraumatic.  CHEST: Normal effort of breathing, regular heart rate ABDOMEN: Soft, nontender, gravid  Cervical exam:  Dilation: Closed Effacement (%): Thick Cervical Position: Posterior Station: Ballotable Presentation: Vertex Exam by:: [email or web address], CNM  Fetal Monitoring: reactive Baseline: 140 Variability: moderate Accelerations: present Decelerations: none Contractions: relaxed  Results for orders placed or performed during the hospital encounter of 06/22/20 (from the past 24 hour(s))  Urinalysis, Routine w reflex microscopic Urine, Clean Catch     Status: Abnormal   Collection Time: 06/22/20  8:59 PM  Result Value Ref Range   Color, Urine YELLOW YELLOW   APPearance HAZY (A) CLEAR   Specific Gravity, Urine 1.014 1.005 - 1.030   pH 6.0 5.0 - 8.0   Glucose, UA NEGATIVE NEGATIVE mg/dL   Hgb urine dipstick NEGATIVE NEGATIVE   Bilirubin Urine NEGATIVE NEGATIVE   Ketones, ur 5 (A) NEGATIVE mg/dL   Protein, ur NEGATIVE NEGATIVE mg/dL   Nitrite NEGATIVE NEGATIVE   Leukocytes,Ua SMALL (A) NEGATIVE   RBC / HPF 0-5 0 - 5 RBC/hpf    WBC, UA 0-5 0 - 5 WBC/hpf   Bacteria, UA RARE (A) NONE SEEN   Squamous Epithelial / LPF 11-20 0 - 5   Mucus PRESENT   Wet prep, genital     Status: Abnormal   Collection Time: 06/22/20  9:05 PM   Specimen: Vaginal  Result Value Ref Range   Yeast Wet Prep HPF POC NONE SEEN NONE SEEN   Trich, Wet Prep NONE SEEN NONE SEEN   Clue Cells Wet Prep HPF POC NONE SEEN NONE SEEN   WBC, Wet Prep HPF POC MANY (A) NONE SEEN   Sperm NONE SEEN    Pain remained abated and pt did not experience any additional contractions while in MAU.  A: SIUP at [redacted]w[redacted]d  False labor  P: Discharge to home with preterm labor precautions Education given in AVS re: GDM  Follow up at [facility] as scheduled for ongoing prenatal care  [redacted]w[redacted]d, CNM 06/22/2020 9:36 PM

## 2020-06-22 NOTE — MAU Note (Signed)
..  [name] is a 24 y.o. at [redacted]w[redacted]d here in MAU reporting: pelvic pain and back pain that began at 4pm and stopped 15-20 minutes before her arrival to MAU. The pain was intermittent, 7/10 period-like cramping. Pt reports decreased fetal movement during the pain but she feels baby move now. Denies vaginal bleeding, LOF, or abnormal vaginal discharge.   Pain score: currently 0/10 Vitals:   06/22/20 2020 06/22/20 2022  BP:  (!) 103/58  Pulse:  (!) 155  Resp:  16  Temp:  98.5 F (36.9 C)  SpO2: 99%      FHT: doppler 150

## 2020-06-23 ENCOUNTER — Other Ambulatory Visit: Payer: Self-pay | Admitting: *Deleted

## 2020-06-23 ENCOUNTER — Encounter: Payer: Self-pay | Admitting: Women's Health

## 2020-06-23 ENCOUNTER — Other Ambulatory Visit: Payer: Self-pay | Admitting: Women's Health

## 2020-06-23 DIAGNOSIS — O0993 Supervision of high risk pregnancy, unspecified, third trimester: Secondary | ICD-10-CM

## 2020-06-23 DIAGNOSIS — O24419 Gestational diabetes mellitus in pregnancy, unspecified control: Secondary | ICD-10-CM | POA: Insufficient documentation

## 2020-06-23 DIAGNOSIS — R000 Tachycardia, unspecified: Secondary | ICD-10-CM

## 2020-06-23 DIAGNOSIS — O24410 Gestational diabetes mellitus in pregnancy, diet controlled: Secondary | ICD-10-CM

## 2020-06-23 MED ORDER — FERROUS SULFATE 325 (65 FE) MG PO TABS: 325.0000 mg | ORAL_TABLET | Freq: Two times a day (BID) | ORAL | 3 refills | Status: DC

## 2020-06-23 MED ORDER — ACCU-CHEK SOFTCLIX LANCETS MISC: 12 refills | Status: DC

## 2020-06-23 MED ORDER — ACCU-CHEK GUIDE ME W/DEVICE KIT: 1.0000 | PACK | Freq: Four times a day (QID) | 0 refills | Status: DC

## 2020-06-23 MED ORDER — ACCU-CHEK GUIDE VI STRP: ORAL_STRIP | 12 refills | Status: DC

## 2020-06-26 ENCOUNTER — Other Ambulatory Visit: Payer: Self-pay

## 2020-06-26 ENCOUNTER — Encounter (HOSPITAL_COMMUNITY): Payer: Self-pay | Admitting: Obstetrics & Gynecology

## 2020-06-26 ENCOUNTER — Inpatient Hospital Stay (HOSPITAL_COMMUNITY)
Admission: AD | Admit: 2020-06-26 | Discharge: 2020-06-27 | Disposition: A | Discharge status: Home / Self Care | Payer: Medicaid Other | Attending: Obstetrics & Gynecology | Admitting: Obstetrics & Gynecology

## 2020-06-26 DIAGNOSIS — R519 Headache, unspecified: Secondary | ICD-10-CM | POA: Diagnosis not present

## 2020-06-26 DIAGNOSIS — O26893 Other specified pregnancy related conditions, third trimester: Secondary | ICD-10-CM | POA: Diagnosis not present

## 2020-06-26 DIAGNOSIS — O10913 Unspecified pre-existing hypertension complicating pregnancy, third trimester: Secondary | ICD-10-CM | POA: Diagnosis not present

## 2020-06-26 DIAGNOSIS — Z7982 Long term (current) use of aspirin: Secondary | ICD-10-CM | POA: Insufficient documentation

## 2020-06-26 DIAGNOSIS — Z3A33 33 weeks gestation of pregnancy: Secondary | ICD-10-CM | POA: Diagnosis not present

## 2020-06-26 DIAGNOSIS — G44229 Chronic tension-type headache, not intractable: Secondary | ICD-10-CM | POA: Diagnosis not present

## 2020-06-26 DIAGNOSIS — Z79899 Other long term (current) drug therapy: Secondary | ICD-10-CM | POA: Diagnosis not present

## 2020-06-26 LAB — URINALYSIS, ROUTINE W REFLEX MICROSCOPIC
Bilirubin Urine: NEGATIVE
Glucose, UA: NEGATIVE mg/dL
Hgb urine dipstick: NEGATIVE
Ketones, ur: 5 mg/dL — AB
Leukocytes,Ua: NEGATIVE
Nitrite: NEGATIVE
Protein, ur: NEGATIVE mg/dL
Specific Gravity, Urine: 1.013 (ref 1.005–1.030)
pH: 6 (ref 5.0–8.0)

## 2020-06-26 MED ORDER — DIPHENHYDRAMINE HCL 50 MG/ML IJ SOLN
25.0000 mg | Freq: Once | INTRAMUSCULAR | Status: AC
  Administered 2020-06-26: 25 mg via INTRAVENOUS
  Filled 2020-06-26: qty 1

## 2020-06-26 MED ORDER — DEXAMETHASONE SODIUM PHOSPHATE 10 MG/ML IJ SOLN
10.0000 mg | Freq: Once | INTRAMUSCULAR | Status: AC
  Administered 2020-06-26: 10 mg via INTRAVENOUS
  Filled 2020-06-26: qty 1

## 2020-06-26 MED ORDER — LACTATED RINGERS IV SOLN: INTRAVENOUS | Status: DC

## 2020-06-26 MED ORDER — METOCLOPRAMIDE HCL 5 MG/ML IJ SOLN
10.0000 mg | Freq: Once | INTRAMUSCULAR | Status: AC
  Administered 2020-06-26: 10 mg via INTRAVENOUS
  Filled 2020-06-26: qty 2

## 2020-06-26 NOTE — MAU Provider Note (Signed)
None     Chief Complaint:  Headache   Melanie Russo is  24 y.o. G2P1001 at 55w0dpresents complaining of Headache She has hx of HAs, has rx for fioricet.  Took some tonight, it helped, but HA came back. Dx w/GHTN at 26 weeks, but upon discussion w/Dr. ARoselie Awkward pt seems more CHTN, so plan to tx HA. Having some ctx on EFM q 1-4 minutes, feeling some of them. fFn collected, Obstetrical/Gynecological History: OB History    Gravida  2   Para  1   Term  1   Preterm      AB      Living  1     SAB      TAB      Ectopic      Multiple      Live Births  1          Past Medical History: Past Medical History:  Diagnosis Date  . Arthritis   . Hypertension    gestational   . Sjogren's syndrome ([facility]     Past Surgical History: Past Surgical History:  Procedure Laterality Date  . NO PAST SURGERIES      Family History: Family History  Problem Relation Age of Onset  . Hypertension Father   . Arthritis Father   . Obesity Maternal Uncle   . Cancer Maternal Grandmother        brain  . Heart disease Paternal Grandmother   . Hypertension Paternal Grandmother   . Thyroid disease Paternal Grandmother   . Thyroid disease Paternal Grandfather     Social History: Social History   Tobacco Use  . Smoking status: Never Smoker  . Smokeless tobacco: Never Used  Vaping Use  . Vaping Use: Never used  Substance Use Topics  . Alcohol use: Not Currently    Comment: rarely  . Drug use: Never    Allergies:  Allergies  Allergen Reactions  . Kiwi Extract Swelling  . Latex   . Other Swelling    Blueberries, blackberries, raspberries  . Penicillins     Meds:  Medications Prior to Admission  Medication Sig Dispense Refill Last Dose  . Accu-Chek Softclix Lancets lancets Use as instructed to check blood sugar 4 times daily 100 each 12 06/26/2020 at Unknown time  . acetaminophen (TYLENOL) 500 MG tablet Take 1,000 mg by mouth every 6 (six) hours as needed.   06/26/2020  at 1930  . aspirin 81 MG chewable tablet Chew 162 mg by mouth daily.   Past Month at Unknown time  . Blood Glucose Monitoring Suppl (ACCU-CHEK GUIDE ME) w/Device KIT 1 each by Does not apply route 4 (four) times daily. 1 kit 0 06/26/2020 at Unknown time  . butalbital-acetaminophen-caffeine (FIORICET) 50-325-40 MG tablet Take 1-2 tablets by mouth every 6 (six) hours as needed for headache. Take 1-2 tablets by mouth every 6 (six) hours as needed for headache.   06/26/2020 at 1400  . glucose blood (ACCU-CHEK GUIDE) test strip Use as instructed to check blood sugar 4 times daily 50 each 12 06/26/2020 at Unknown time  . ondansetron (ZOFRAN ODT) 4 MG disintegrating tablet Take 1 tablet (4 mg total) by mouth every 6 (six) hours as needed for nausea. 30 tablet 2 Past Month at Unknown time  . pantoprazole (PROTONIX) 20 MG tablet Take 1 tablet (20 mg total) by mouth daily. 30 tablet 6 Past Month at Unknown time  . Prenatal Vit-Fe Fumarate-FA (MULTIVITAMIN-PRENATAL) 27-0.8 MG TABS tablet Take 1 tablet by  mouth daily at 12 noon.   Past Week at Unknown time  . Blood Pressure Monitor MISC For regular home bp monitoring during pregnancy 1 each 0   . ferrous sulfate 325 (65 FE) MG tablet Take 1 tablet (325 mg total) by mouth 2 (two) times daily with a meal. 60 tablet 3     Review of Systems   Constitutional: Negative for fever and chills Eyes: Negative for visual disturbances Respiratory: Negative for shortness of breath, dyspnea Cardiovascular: Negative for chest pain or palpitations  Gastrointestinal: Negative for vomiting, diarrhea and constipation Genitourinary: Negative for dysuria and urgency Musculoskeletal: Negative for back pain, joint pain, myalgias.  Normal ROM  Neurological: Negative for dizziness and headaches    Physical Exam  Blood pressure 122/74, pulse (!) 127, temperature 97.7 F (36.5 C), resp. rate 18, height '5\' 5"'  (1.651 m), weight 112.5 kg, last menstrual period 11/08/2019. GENERAL:  Well-developed, well-nourished female in no acute distress.  LUNGS: Normal respiratory effort HEART: Regular rate and rhythm. ABDOMEN: Soft, nontender, nondistended, gravid.  EXTREMITIES: Nontender, no edema, 2+ distal pulses. DTR's 2+ CERVICAL EXAM: Dilatation 0cm   Effacement 0%   Station -3   Presentation: cephalic FHT:  Baseline rate 150 bpm   Variability moderate  Accelerations present   Decelerations none Contractions: irregular, palpate mild    Labs: Results for orders placed or performed during the hospital encounter of 06/26/20 (from the past 24 hour(s))  Urinalysis, Routine w reflex microscopic Urine, Clean Catch   Collection Time: 06/26/20  9:09 PM  Result Value Ref Range   Color, Urine YELLOW YELLOW   APPearance CLEAR CLEAR   Specific Gravity, Urine 1.013 1.005 - 1.030   pH 6.0 5.0 - 8.0   Glucose, UA NEGATIVE NEGATIVE mg/dL   Hgb urine dipstick NEGATIVE NEGATIVE   Bilirubin Urine NEGATIVE NEGATIVE   Ketones, ur 5 (A) NEGATIVE mg/dL   Protein, ur NEGATIVE NEGATIVE mg/dL   Nitrite NEGATIVE NEGATIVE   Leukocytes,Ua NEGATIVE NEGATIVE   Imaging Studies:  No results found.  Assessment: Melanie Russo is  24 y.o. G2P1001 at 19w0dpresents with HA. .Marland Kitchen Plan: IV cocktail given w/great results.  Pt not feeling ctx, fFn neg and cx LTC. D/C'd home w/labor precautions  FChristin Fudge11/18/202110:45 PM

## 2020-06-26 NOTE — MAU Note (Signed)
Has had h/a since early am. Tylenol did not help. Fioricet helped some but h/a came back when med wore off. Spotting today x 1. Lightheaded earlier but better now.

## 2020-06-27 ENCOUNTER — Encounter: Payer: Self-pay | Admitting: Advanced Practice Midwife

## 2020-06-27 DIAGNOSIS — Z3A33 33 weeks gestation of pregnancy: Secondary | ICD-10-CM

## 2020-06-27 DIAGNOSIS — G44229 Chronic tension-type headache, not intractable: Secondary | ICD-10-CM

## 2020-06-27 DIAGNOSIS — I10 Essential (primary) hypertension: Secondary | ICD-10-CM

## 2020-06-27 LAB — FETAL FIBRONECTIN: Fetal Fibronectin: NEGATIVE

## 2020-06-27 NOTE — Progress Notes (Unsigned)
Pt given diagnosis of GHTN at 26 weeks after presenting w/several SBPs in the 140 range. BPs have been labile throughout pregnancy (same visit at 26 weeks w/serial BPs showed as low as 109/56). She was placed in antenatal testing appropriate for any mild hypertensive disorder, with plan for IOL at 37 weeks.  However, upon further review of the chart (and with input from Drs Despina Hidden and Debroah Loop), because pt had SBPs >140s and often in the 130's as early as 15 weeks, her diagnosis is changed to [facility]  All antenatal testing remains the same, but plan IOL at 39 weeks, earlier if clinically indicated.

## 2020-06-27 NOTE — Progress Notes (Signed)
Written and verbal d/c instructions given and understanding voiced. 

## 2020-06-27 NOTE — Discharge Instructions (Signed)
   [facility] HOSPITAL HAS MOVED!!! It is now [facility] ([address] [place], [place] [address]) Entrance located off of [address] Free 24/7 valet parking   Go to [email or web address] to register for FREE online childbirth classes    Call the office [phone]) or go to [facility] if:  You begin to have strong, frequent contractions  Your water breaks.  Sometimes it is a big gush of fluid, sometimes it is just a trickle that keeps getting your panties wet or running down your legs  You have vaginal bleeding.  It is normal to have a small amount of spotting if your cervix was checked.   You don't feel your baby moving like normal.  If you don't, get you something to eat and drink and lay down and focus on feeling your baby move.  You should feel at least 10 movements in 2 hours.  If you don't, you should call the office or go to [facility].   Home Blood Pressure Monitoring for Patients   Your provider has recommended that you check your blood pressure (BP) at least once a week at home. If you do not have a blood pressure cuff at home, one will be provided for you. Contact your provider if you have not received your monitor within 1 week.   Helpful Tips for Accurate Home Blood Pressure Checks  . Don't smoke, exercise, or drink caffeine 30 minutes before checking your BP . Use the restroom before checking your BP (a full bladder can raise your pressure) . Relax in a comfortable upright chair . Feet on the ground . Left arm resting comfortably on a flat surface at the level of your heart . Legs uncrossed . Back supported . Sit quietly and don't talk . Place the cuff on your bare arm . Adjust snuggly, so that only two fingertips can fit between your skin and the top of the cuff . Check 2 readings separated by at least one minute . Keep a log of your BP readings . For a visual, please reference this diagram:  http://ccnc.care/bpdiagram  Provider Name: [facility]     Phone: [phone]  Zone 1: ALL CLEAR  Continue to monitor your symptoms:  . BP reading is less than 140 (top number) or less than 90 (bottom number)  . No right upper stomach pain . No headaches or seeing spots . No feeling nauseated or throwing up . No swelling in face and hands  Zone 2: CAUTION Call your doctor's office for any of the following:  . BP reading is greater than 140 (top number) or greater than 90 (bottom number)  . Stomach pain under your ribs in the middle or right side . Headaches or seeing spots . Feeling nauseated or throwing up . Swelling in face and hands  Zone 3: EMERGENCY  Seek immediate medical care if you have any of the following:  . BP reading is greater than160 (top number) or greater than 110 (bottom number) . Severe headaches not improving with Tylenol . Serious difficulty catching your breath . Any worsening symptoms from Zone 2

## 2020-07-01 ENCOUNTER — Encounter: Payer: Self-pay | Admitting: Women's Health

## 2020-07-01 ENCOUNTER — Other Ambulatory Visit: Payer: Self-pay

## 2020-07-01 ENCOUNTER — Ambulatory Visit (INDEPENDENT_AMBULATORY_CARE_PROVIDER_SITE_OTHER): Payer: Medicaid Other | Admitting: Women's Health

## 2020-07-01 VITALS — BP 122/82 | HR 128 | Wt 246.0 lb

## 2020-07-01 DIAGNOSIS — Z3A33 33 weeks gestation of pregnancy: Secondary | ICD-10-CM

## 2020-07-01 DIAGNOSIS — Z331 Pregnant state, incidental: Secondary | ICD-10-CM

## 2020-07-01 DIAGNOSIS — Z1389 Encounter for screening for other disorder: Secondary | ICD-10-CM

## 2020-07-01 DIAGNOSIS — I10 Essential (primary) hypertension: Secondary | ICD-10-CM

## 2020-07-01 DIAGNOSIS — O24419 Gestational diabetes mellitus in pregnancy, unspecified control: Secondary | ICD-10-CM

## 2020-07-01 DIAGNOSIS — O0993 Supervision of high risk pregnancy, unspecified, third trimester: Secondary | ICD-10-CM

## 2020-07-01 DIAGNOSIS — Z302 Encounter for sterilization: Secondary | ICD-10-CM

## 2020-07-01 LAB — POCT URINALYSIS DIPSTICK OB
Blood, UA: NEGATIVE
Glucose, UA: NEGATIVE
Leukocytes, UA: NEGATIVE
Nitrite, UA: NEGATIVE
POC,PROTEIN,UA: NEGATIVE

## 2020-07-01 MED ORDER — METFORMIN HCL 500 MG PO TABS: 500.0000 mg | ORAL_TABLET | Freq: Every day | ORAL | 1 refills | Status: DC

## 2020-07-01 NOTE — Progress Notes (Signed)
HIGH-RISK PREGNANCY VISIT Patient name: Melanie Russo MRN [id number]  Date of birth: [birth year] Chief Complaint:   High Risk Gestation (NST)  History of Present Illness:   Melanie Russo is a 24 y.o. G52P1001 female at [redacted]w[redacted]d with an Estimated Date of Delivery: 08/14/20 being seen today for ongoing management of a high-risk pregnancy complicated by chronic hypertension currently on no meds and diabetes mellitus A1DM.  Today she reports FBS all >95 (but under 100- gets up in middle of night and eats apple slices, the one night she didn't FBS was <95), most 2hr pp >120 (120-130s). Decreased fm this am.  Depression screen [facility] 2/9 06/20/2020 02/05/2020 12/14/2019  Decreased Interest 0 1 2  Down, Depressed, Hopeless 1 1 0  PHQ - 2 Score 1 2 2   Altered sleeping 3 3 3   Tired, decreased energy 3 3 3   Change in appetite 0 3 1  Feeling bad or failure about yourself  0 1 0  Trouble concentrating 0 1 2  Moving slowly or fidgety/restless 0 0 0  Suicidal thoughts 0 0 0  PHQ-9 Score 7 13 11   Difficult doing work/chores - - Not difficult at all    Contractions: Irregular. Vag. Bleeding: None.  Movement: (!) Decreased. denies leaking of fluid.  Review of Systems:   Pertinent items are noted in HPI Denies abnormal vaginal discharge w/ itching/odor/irritation, headaches, visual changes, shortness of breath, chest pain, abdominal pain, severe nausea/vomiting, or problems with urination or bowel movements unless otherwise stated above. Pertinent History Reviewed:  Reviewed past medical,surgical, social, obstetrical and family history.  Reviewed problem list, medications and allergies. Physical Assessment:   Vitals:   07/01/20 1023  BP: 122/82  Pulse: (!) 128  Weight: 246 lb (111.6 kg)  Body mass index is 40.94 kg/m.           Physical Examination:   General appearance: alert, well appearing, and in no distress  Mental status: alert, oriented to person, place, and time  Skin: warm & dry    Extremities: Edema: Trace    Cardiovascular: normal heart rate noted  Respiratory: normal respiratory effort, no distress  Abdomen: gravid, soft, non-tender  Pelvic: Cervical exam deferred         Fetal Status: Fetal Heart Rate (bpm): 150   Movement: (!) Decreased    Fetal Surveillance Testing today: NST: FHR baseline 150 bpm, Variability: moderate, Accelerations:present, Decelerations:  Absent= Cat 1/Reactive Toco: none     Chaperone: N/A    Results for orders placed or performed in visit on 07/01/20 (from the past 24 hour(s))  POC Urinalysis Dipstick OB   Collection Time: 07/01/20 10:25 AM  Result Value Ref Range   Color, UA     Clarity, UA     Glucose, UA Negative Negative   Bilirubin, UA     Ketones, UA 2+    Spec Grav, UA     Blood, UA neg    pH, UA     POC,PROTEIN,UA Negative Negative, Trace, Small (1+), Moderate (2+), Large (3+), 4+   Urobilinogen, UA     Nitrite, UA neg    Leukocytes, UA Negative Negative   Appearance     Odor      Assessment & Plan:  High-risk pregnancy: G2P1001 at [redacted]w[redacted]d with an Estimated Date of Delivery: 08/14/20   1) CHTN, stable, reviewed pre-e s/s, reasons to seek care  2) A2DM, rx metformin 500mg  q AM, to stop middle of night snack to see how FBS do, if  still high will add metformin in PM  Meds:  Meds ordered this encounter  Medications  . metFORMIN (GLUCOPHAGE) 500 MG tablet    Sig: Take 1 tablet (500 mg total) by mouth daily with breakfast.    Dispense:  30 tablet    Refill:  1    Order Specific Question:   Supervising Provider    Answer:   Lazaro Arms [[id number]]    Labs/procedures today: nst  Treatment Plan:  Growth u/s q4wks    2x/wk testing nst/sono      Deliver 38-39wks (37wks or prn if poor control)____   Reviewed: Preterm labor symptoms and general obstetric precautions including but not limited to vaginal bleeding, contractions, leaking of fluid and fetal movement were reviewed in detail with the patient.  All questions  were answered. Has home bp cuff.   Follow-up: Return for needs bpp/dopp w/ [facility] w/ MD/CNM on Tues, NST w/ nurse on Fri until 12/30.   Future Appointments  Date Time Provider Department Center  07/02/2020  3:15 PM [facility] GDM CLASS [facility] NDM  07/08/2020  9:50 AM Lazaro Arms, MD [facility]  07/11/2020  9:50 AM [facility] NURSE [facility]  07/15/2020 11:10 AM Lazaro Arms, MD [facility]  07/18/2020  9:50 AM [facility] NURSE [facility]  07/22/2020  9:50 AM Cheral Marker, CNM [facility]  07/25/2020  9:50 AM [facility] NURSE [facility]  07/29/2020  9:50 AM Cheral Marker, CNM [facility]  08/06/2020  9:30 AM [facility] [place] [facility] None  08/06/2020 10:30 AM Arabella Merles, CNM [facility]    Orders Placed This Encounter  Procedures  . US FETAL BPP WO NON STRESS  . [place] UA Cord Doppler  . POC Urinalysis Dipstick OB   Cheral Marker CNM, [facility] 07/01/2020 11:24 AM

## 2020-07-01 NOTE — Patient Instructions (Signed)
Melanie Russo, I greatly value your feedback.  If you receive a survey following your visit with [place] today, we appreciate you taking the time to fill it out.  Thanks, Joellyn Haff, CNM, WHNP-BC  [facility] ([address] [place], [place] [address]) Entrance C, located off of [address] valet parking   Go to [email or web address] to register for FREE online childbirth classes    Call the office [phone]) or go to [facility] if:  You begin to have strong, frequent contractions  Your water breaks.  Sometimes it is a big gush of fluid, sometimes it is just a trickle that keeps getting your panties wet or running down your legs  You have vaginal bleeding.  It is normal to have a small amount of spotting if your cervix was checked.   You don't feel your baby moving like normal.  If you don't, get you something to eat and drink and lay down and focus on feeling your baby move.  You should feel at least 10 movements in 2 hours.  If you don't, you should call the office or go to [facility].   Call the office [phone]) or go to [facility] hospital for these signs of pre-eclampsia:  Severe headache that does not go away with Tylenol  Visual changes- seeing spots, double, blurred vision  Pain under your right breast or upper abdomen that does not go away with Tums or heartburn medicine  Nausea and/or vomiting  Severe swelling in your hands, feet, and face    Home Blood Pressure Monitoring for Patients   Your provider has recommended that you check your blood pressure (BP) at least once a week at home. If you do not have a blood pressure cuff at home, one will be provided for you. Contact your provider if you have not received your monitor within 1 week.   Helpful Tips for Accurate Home Blood Pressure Checks  . Don't smoke, exercise, or drink caffeine 30 minutes before checking your BP . Use the restroom before checking your BP (a full  bladder can raise your pressure) . Relax in a comfortable upright chair . Feet on the ground . Left arm resting comfortably on a flat surface at the level of your heart . Legs uncrossed . Back supported . Sit quietly and don't talk . Place the cuff on your bare arm . Adjust snuggly, so that only two fingertips can fit between your skin and the top of the cuff . Check 2 readings separated by at least one minute . Keep a log of your BP readings . For a visual, please reference this diagram: http://ccnc.care/bpdiagram  Provider Name: [facility]     Phone: [phone]  Zone 1: ALL CLEAR  Continue to monitor your symptoms:  . BP reading is less than 140 (top number) or less than 90 (bottom number)  . No right upper stomach pain . No headaches or seeing spots . No feeling nauseated or throwing up . No swelling in face and hands  Zone 2: CAUTION Call your doctor's office for any of the following:  . BP reading is greater than 140 (top number) or greater than 90 (bottom number)  . Stomach pain under your ribs in the middle or right side . Headaches or seeing spots . Feeling nauseated or throwing up . Swelling in face and hands  Zone 3: EMERGENCY  Seek immediate medical care if you have any of the  following:  . BP reading is greater than160 (top number) or greater than 110 (bottom number) . Severe headaches not improving with Tylenol . Serious difficulty catching your breath . Any worsening symptoms from Zone 2  Preterm Labor and Birth Information  The normal length of a pregnancy is 39-41 weeks. Preterm labor is when labor starts before 37 completed weeks of pregnancy. What are the risk factors for preterm labor? Preterm labor is more likely to occur in women who:  Have certain infections during pregnancy such as a bladder infection, sexually transmitted infection, or infection inside the uterus (chorioamnionitis).  Have a shorter-than-normal cervix.  Have gone into  preterm labor before.  Have had surgery on their cervix.  Are younger than age 41 or older than age 6.  Are African American.  Are pregnant with twins or multiple babies (multiple gestation).  Take street drugs or smoke while pregnant.  Do not gain enough weight while pregnant.  Became pregnant shortly after having been pregnant. What are the symptoms of preterm labor? Symptoms of preterm labor include:  Cramps similar to those that can happen during a menstrual period. The cramps may happen with diarrhea.  Pain in the abdomen or lower back.  Regular uterine contractions that may feel like tightening of the abdomen.  A feeling of increased pressure in the pelvis.  Increased watery or bloody mucus discharge from the vagina.  Water breaking (ruptured amniotic sac). Why is it important to recognize signs of preterm labor? It is important to recognize signs of preterm labor because babies who are born prematurely may not be fully developed. This can put them at an increased risk for:  Long-term (chronic) heart and lung problems.  Difficulty immediately after birth with regulating body systems, including blood sugar, body temperature, heart rate, and breathing rate.  Bleeding in the brain.  Cerebral palsy.  Learning difficulties.  Death. These risks are highest for babies who are born before 21 weeks of pregnancy. How is preterm labor treated? Treatment depends on the length of your pregnancy, your condition, and the health of your baby. It may involve: 1. Having a stitch (suture) placed in your cervix to prevent your cervix from opening too early (cerclage). 2. Taking or being given medicines, such as: ? Hormone medicines. These may be given early in pregnancy to help support the pregnancy. ? Medicine to stop contractions. ? Medicines to help mature the baby's lungs. These may be prescribed if the risk of delivery is high. ? Medicines to prevent your baby from  developing cerebral palsy. If the labor happens before 34 weeks of pregnancy, you may need to stay in the hospital. What should I do if I think I am in preterm labor? If you think that you are going into preterm labor, call your health care provider right away. How can I prevent preterm labor in future pregnancies? To increase your chance of having a full-term pregnancy:  Do not use any tobacco products, such as cigarettes, chewing tobacco, and e-cigarettes. If you need help quitting, ask your health care provider.  Do not use street drugs or medicines that have not been prescribed to you during your pregnancy.  Talk with your health care provider before taking any herbal supplements, even if you have been taking them regularly.  Make sure you gain a healthy amount of weight during your pregnancy.  Watch for infection. If you think that you might have an infection, get it checked right away.  Make sure to tell  your health care provider if you have gone into preterm labor before. This information is not intended to replace advice given to you by your health care provider. Make sure you discuss any questions you have with your health care provider. Document Revised: 11/17/2018 Document Reviewed: 12/17/2015 Elsevier Patient Education  [place].

## 2020-07-02 ENCOUNTER — Other Ambulatory Visit: Payer: Self-pay

## 2020-07-02 ENCOUNTER — Other Ambulatory Visit: Payer: Self-pay | Admitting: Women's Health

## 2020-07-02 ENCOUNTER — Encounter: Payer: Medicaid Other | Attending: Obstetrics & Gynecology | Admitting: Registered"

## 2020-07-02 DIAGNOSIS — O24419 Gestational diabetes mellitus in pregnancy, unspecified control: Secondary | ICD-10-CM | POA: Diagnosis not present

## 2020-07-02 DIAGNOSIS — L299 Pruritus, unspecified: Secondary | ICD-10-CM

## 2020-07-02 DIAGNOSIS — O0993 Supervision of high risk pregnancy, unspecified, third trimester: Secondary | ICD-10-CM | POA: Diagnosis not present

## 2020-07-03 ENCOUNTER — Encounter (HOSPITAL_COMMUNITY): Payer: Self-pay | Admitting: Obstetrics & Gynecology

## 2020-07-03 ENCOUNTER — Inpatient Hospital Stay (HOSPITAL_COMMUNITY)
Admission: AD | Admit: 2020-07-03 | Discharge: 2020-07-03 | Disposition: A | Discharge status: Home / Self Care | Payer: Medicaid Other | Attending: Obstetrics & Gynecology | Admitting: Obstetrics & Gynecology

## 2020-07-03 ENCOUNTER — Other Ambulatory Visit: Payer: Self-pay

## 2020-07-03 DIAGNOSIS — O368130 Decreased fetal movements, third trimester, not applicable or unspecified: Secondary | ICD-10-CM

## 2020-07-03 DIAGNOSIS — Z3A34 34 weeks gestation of pregnancy: Secondary | ICD-10-CM | POA: Diagnosis not present

## 2020-07-03 HISTORY — DX: Anemia, unspecified: D64.9

## 2020-07-03 LAB — URINALYSIS, ROUTINE W REFLEX MICROSCOPIC
Bilirubin Urine: NEGATIVE
Glucose, UA: NEGATIVE mg/dL
Hgb urine dipstick: NEGATIVE
Ketones, ur: 20 mg/dL — AB
Nitrite: NEGATIVE
Protein, ur: NEGATIVE mg/dL
Specific Gravity, Urine: 1.014 (ref 1.005–1.030)
pH: 6 (ref 5.0–8.0)

## 2020-07-03 NOTE — MAU Note (Signed)
Pt reports no fetal movement since 10 am. Pt does report feeling one movement since being in MAU.  Denies vaginal bleeding or LOF.

## 2020-07-03 NOTE — MAU Provider Note (Addendum)
First Provider Initiated Contact with Patient 07/03/20 1754      Chief Complaint:  Decreased Fetal Movement   Melanie Russo is  24 y.o. G2P1001 at 78w0dpresents complaining of Decreased Fetal Movement She is now feeling the baby move (>5 times in the past 1/2 hour).   Obstetrical/Gynecological History: OB History    Gravida  2   Para  1   Term  1   Preterm      AB      Living  1     SAB      TAB      Ectopic      Multiple      Live Births  1          Past Medical History: Past Medical History:  Diagnosis Date  . Anemia   . Arthritis   . Hypertension    gestational   . Sjogren's syndrome ([place]     Past Surgical History: Past Surgical History:  Procedure Laterality Date  . NO PAST SURGERIES      Family History: Family History  Problem Relation Age of Onset  . Hypertension Father   . Arthritis Father   . Obesity Maternal Uncle   . Cancer Maternal Grandmother        brain  . Heart disease Paternal Grandmother   . Hypertension Paternal Grandmother   . Thyroid disease Paternal Grandmother   . Thyroid disease Paternal Grandfather     Social History: Social History   Tobacco Use  . Smoking status: Never Smoker  . Smokeless tobacco: Never Used  Vaping Use  . Vaping Use: Never used  Substance Use Topics  . Alcohol use: Not Currently    Comment: rarely  . Drug use: Never    Allergies:  Allergies  Allergen Reactions  . Kiwi Extract Anaphylaxis  . Other Anaphylaxis    Blueberries, blackberries, raspberries, nuts- pt reports that it feels like she is being stabbed in the mouth when eating nuts   . Latex   . Penicillins     Meds:  Medications Prior to Admission  Medication Sig Dispense Refill Last Dose  . Accu-Chek Softclix Lancets lancets Use as instructed to check blood sugar 4 times daily 100 each 12 07/03/2020 at Unknown time  . acetaminophen (TYLENOL) 500 MG tablet Take 1,000 mg by mouth every 6 (six) hours as needed.   Past  Week at Unknown time  . Blood Glucose Monitoring Suppl (ACCU-CHEK GUIDE ME) w/Device KIT 1 each by Does not apply route 4 (four) times daily. 1 kit 0 07/03/2020 at Unknown time  . Blood Pressure Monitor MISC For regular home bp monitoring during pregnancy 1 each 0 07/03/2020 at Unknown time  . butalbital-acetaminophen-caffeine (FIORICET) 50-325-40 MG tablet Take 1-2 tablets by mouth every 6 (six) hours as needed for headache. Take 1-2 tablets by mouth every 6 (six) hours as needed for headache.   Past Week at Unknown time  . ferrous sulfate 325 (65 FE) MG tablet Take 1 tablet (325 mg total) by mouth 2 (two) times daily with a meal. 60 tablet 3 07/03/2020 at Unknown time  . glucose blood (ACCU-CHEK GUIDE) test strip Use as instructed to check blood sugar 4 times daily 50 each 12 07/03/2020 at Unknown time  . ondansetron (ZOFRAN ODT) 4 MG disintegrating tablet Take 1 tablet (4 mg total) by mouth every 6 (six) hours as needed for nausea. 30 tablet 2 Past Week at Unknown time  . pantoprazole (PROTONIX) 20  MG tablet Take 1 tablet (20 mg total) by mouth daily. 30 tablet 6 07/02/2020 at Unknown time  . Prenatal Vit-Fe Fumarate-FA (MULTIVITAMIN-PRENATAL) 27-0.8 MG TABS tablet Take 1 tablet by mouth daily at 12 noon.   07/02/2020 at Unknown time  . aspirin 81 MG chewable tablet Chew 162 mg by mouth daily.     . metFORMIN (GLUCOPHAGE) 500 MG tablet Take 1 tablet (500 mg total) by mouth daily with breakfast. 30 tablet 1     Review of Systems   Constitutional: Negative for fever and chills Eyes: Negative for visual disturbances Respiratory: Negative for shortness of breath, dyspnea Cardiovascular: Negative for chest pain or palpitations  Gastrointestinal: Negative for vomiting, diarrhea and constipation Genitourinary: Negative for dysuria and urgency Musculoskeletal: Negative for back pain, joint pain, myalgias.  Normal ROM  Neurological: Negative for dizziness and headaches    Physical Exam  Blood  pressure 125/66, pulse (!) 112, temperature 98.8 F (37.1 C), temperature source Oral, resp. rate 20, last menstrual period 11/08/2019, SpO2 96 %. GENERAL: Well-developed, well-nourished female in no acute distress.  LUNGS: Normal respiratory effort HEART: Regular rate and rhythm. ABDOMEN: Soft, nontender, nondistended, gravid.  EXTREMITIES: Nontender, no edema, 2+ distal pulses. DTR's 2+ FHT:  Baseline rate 140 bpm   Variability moderate  Accelerations present   Decelerations none Contractions: Every 0 mins, some UI   Labs: No results found for this or any previous visit (from the past 24 hour(s)). Imaging Studies:   Assessment: Melanie Russo is  24 y.o. G2P1001 at 99w0dpresents with decreased FM, resolved.  Plan: Continue twice weekly testing at [facility] ([facility]  FChristin Fudge11/25/20216:29 PM

## 2020-07-03 NOTE — Discharge Instructions (Signed)
Fetal Movement Counts Patient Name: ________________________________________________ Patient Due Date: ____________________ What is a fetal movement count?  A fetal movement count is the number of times that you feel your baby move during a certain amount of time. This may also be called a fetal kick count. A fetal movement count is recommended for every pregnant woman. You may be asked to start counting fetal movements as early as week 28 of your pregnancy. Pay attention to when your baby is most active. You may notice your baby's sleep and wake cycles. You may also notice things that make your baby move more. You should do a fetal movement count:  When your baby is normally most active.  At the same time each day. A good time to count movements is while you are resting, after having something to eat and drink. How do I count fetal movements? 1. Find a quiet, comfortable area. Sit, or lie down on your side. 2. Write down the date, the start time and stop time, and the number of movements that you felt between those two times. Take this information with you to your health care visits. 3. Write down your start time when you feel the first movement. 4. Count kicks, flutters, swishes, rolls, and jabs. You should feel at least 10 movements. 5. You may stop counting after you have felt 10 movements, or if you have been counting for 2 hours. Write down the stop time. 6. If you do not feel 10 movements in 2 hours, contact your health care provider for further instructions. Your health care provider may want to do additional tests to assess your baby's well-being. Contact a health care provider if:  You feel fewer than 10 movements in 2 hours.  Your baby is not moving like he or she usually does. Date: ____________ Start time: ____________ Stop time: ____________ Movements: ____________ Date: ____________ Start time: ____________ Stop time: ____________ Movements: ____________ Date: ____________  Start time: ____________ Stop time: ____________ Movements: ____________ Date: ____________ Start time: ____________ Stop time: ____________ Movements: ____________ Date: ____________ Start time: ____________ Stop time: ____________ Movements: ____________ Date: ____________ Start time: ____________ Stop time: ____________ Movements: ____________ Date: ____________ Start time: ____________ Stop time: ____________ Movements: ____________ Date: ____________ Start time: ____________ Stop time: ____________ Movements: ____________ Date: ____________ Start time: ____________ Stop time: ____________ Movements: ____________ This information is not intended to replace advice given to you by your health care provider. Make sure you discuss any questions you have with your health care provider. Document Revised: 03/15/2019 Document Reviewed: 03/15/2019 Elsevier Patient Education  2020 [employer] Inc.  

## 2020-07-04 LAB — COMPREHENSIVE METABOLIC PANEL
ALT: 32 IU/L (ref 0–32)
AST: 22 IU/L (ref 0–40)
Albumin/Globulin Ratio: 1 — ABNORMAL LOW (ref 1.2–2.2)
Albumin: 3.2 g/dL — ABNORMAL LOW (ref 3.9–5.0)
Alkaline Phosphatase: 199 IU/L — ABNORMAL HIGH (ref 44–121)
BUN/Creatinine Ratio: 11 (ref 9–23)
BUN: 5 mg/dL — ABNORMAL LOW (ref 6–20)
Bilirubin Total: 0.5 mg/dL (ref 0.0–1.2)
CO2: 21 mmol/L (ref 20–29)
Calcium: 9.3 mg/dL (ref 8.7–10.2)
Chloride: 99 mmol/L (ref 96–106)
Creatinine, Ser: 0.45 mg/dL — ABNORMAL LOW (ref 0.57–1.00)
GFR calc Af Amer: 161 mL/min/{1.73_m2} (ref >59)
GFR calc non Af Amer: 140 mL/min/{1.73_m2} (ref >59)
Globulin, Total: 3.2 g/dL (ref 1.5–4.5)
Glucose: 75 mg/dL (ref 65–99)
Potassium: 4.3 mmol/L (ref 3.5–5.2)
Sodium: 134 mmol/L (ref 134–144)
Total Protein: 6.4 g/dL (ref 6.0–8.5)

## 2020-07-04 LAB — BILE ACIDS, TOTAL: Bile Acids Total: 3.6 umol/L (ref 0.0–10.0)

## 2020-07-07 ENCOUNTER — Encounter: Payer: Self-pay | Admitting: Registered"

## 2020-07-07 NOTE — Progress Notes (Signed)
Patient was seen on 07/02/20 for Gestational Diabetes self-management class at the [facility]. The following learning objectives were met by the patient during this course:   States the definition of Gestational Diabetes  States why dietary management is important in controlling blood glucose  Describes the effects each nutrient has on blood glucose levels  Demonstrates ability to create a balanced meal plan  Demonstrates carbohydrate counting   States when to check blood glucose levels  Demonstrates proper blood glucose monitoring techniques  States the effect of stress and exercise on blood glucose levels  States the importance of limiting caffeine and abstaining from alcohol and smoking  Blood glucose monitor given: none. Patient has meter and checking CBGs prior to class.  Patient instructed to monitor glucose levels: FBS: 60 - <95; 1 hour: <140; 2 hour: <120  Patient received handouts:  Nutrition Diabetes and Pregnancy, including carb counting list  Patient will be seen for follow-up as needed.

## 2020-07-08 ENCOUNTER — Ambulatory Visit (INDEPENDENT_AMBULATORY_CARE_PROVIDER_SITE_OTHER): Payer: Medicaid Other | Admitting: Obstetrics & Gynecology

## 2020-07-08 ENCOUNTER — Encounter: Payer: Self-pay | Admitting: Obstetrics & Gynecology

## 2020-07-08 ENCOUNTER — Other Ambulatory Visit: Payer: Medicaid Other

## 2020-07-08 ENCOUNTER — Ambulatory Visit (INDEPENDENT_AMBULATORY_CARE_PROVIDER_SITE_OTHER): Payer: Medicaid Other

## 2020-07-08 ENCOUNTER — Other Ambulatory Visit: Payer: Self-pay

## 2020-07-08 ENCOUNTER — Other Ambulatory Visit: Payer: Medicaid Other | Admitting: Obstetrics & Gynecology

## 2020-07-08 DIAGNOSIS — O0993 Supervision of high risk pregnancy, unspecified, third trimester: Secondary | ICD-10-CM

## 2020-07-08 DIAGNOSIS — O24419 Gestational diabetes mellitus in pregnancy, unspecified control: Secondary | ICD-10-CM

## 2020-07-08 DIAGNOSIS — Z3A34 34 weeks gestation of pregnancy: Secondary | ICD-10-CM

## 2020-07-08 DIAGNOSIS — Z1389 Encounter for screening for other disorder: Secondary | ICD-10-CM

## 2020-07-08 DIAGNOSIS — Z302 Encounter for sterilization: Secondary | ICD-10-CM

## 2020-07-08 DIAGNOSIS — Z331 Pregnant state, incidental: Secondary | ICD-10-CM

## 2020-07-08 DIAGNOSIS — I10 Essential (primary) hypertension: Secondary | ICD-10-CM | POA: Diagnosis not present

## 2020-07-08 LAB — POCT URINALYSIS DIPSTICK OB
Blood, UA: NEGATIVE
Glucose, UA: NEGATIVE
Ketones, UA: NEGATIVE
Leukocytes, UA: NEGATIVE
Nitrite, UA: NEGATIVE
POC,PROTEIN,UA: NEGATIVE

## 2020-07-08 MED ORDER — GLYBURIDE 2.5 MG PO TABS: 2.5000 mg | ORAL_TABLET | Freq: Two times a day (BID) | ORAL | 3 refills | Status: DC

## 2020-07-08 NOTE — Progress Notes (Signed)
HIGH-RISK PREGNANCY VISIT Patient name: Melanie Russo MRN [id number]  Date of birth: 02/11/1996 Chief Complaint:   Routine Prenatal Visit  History of Present Illness:   Melanie Russo is a 24 y.o. G43P1001 female at [redacted]w[redacted]d with an Estimated Date of Delivery: 08/14/20 being seen today for ongoing management of a high-risk pregnancy complicated by chronic hypertension on no meds and diabetes mellitus Class A2.  Today she reports no complaints.  Depression screen [facility] 2/9 07/07/2020 06/20/2020 02/05/2020 12/14/2019  Decreased Interest 0 0 1 2  Down, Depressed, Hopeless 0 1 1 0  PHQ - 2 Score 0 1 2 2   Altered sleeping - 3 3 3   Tired, decreased energy - 3 3 3   Change in appetite - 0 3 1  Feeling bad or failure about yourself  - 0 1 0  Trouble concentrating - 0 1 2  Moving slowly or fidgety/restless - 0 0 0  Suicidal thoughts - 0 0 0  PHQ-9 Score - 7 13 11   Difficult doing work/chores - - - Not difficult at all     .  .   . denies leaking of fluid.  Review of Systems:   Pertinent items are noted in HPI Denies abnormal vaginal discharge w/ itching/odor/irritation, headaches, visual changes, shortness of breath, chest pain, abdominal pain, severe nausea/vomiting, or problems with urination or bowel movements unless otherwise stated above. Pertinent History Reviewed:  Reviewed past medical,surgical, social, obstetrical and family history.  Reviewed problem list, medications and allergies. Physical Assessment:  There were no vitals filed for this visit.There is no height or weight on file to calculate BMI.           Physical Examination:   General appearance: alert, well appearing, and in no distress  Mental status: alert, oriented to person, place, and time  Skin: warm & dry   Extremities:      Cardiovascular: normal heart rate noted  Respiratory: normal respiratory effort, no distress  Abdomen: gravid, soft, non-tender  Pelvic: Cervical exam deferred         Fetal Status:           Fetal Surveillance Testing today: BPP 8/8 87% Dopplers   Chaperone: N/A    Results for orders placed or performed in visit on 07/08/20 (from the past 24 hour(s))  POC Urinalysis Dipstick OB   Collection Time: 07/08/20 10:31 AM  Result Value Ref Range   Color, UA     Clarity, UA     Glucose, UA Negative Negative   Bilirubin, UA     Ketones, UA neg    Spec Grav, UA     Blood, UA neg    pH, UA     POC,PROTEIN,UA Negative Negative, Trace, Small (1+), Moderate (2+), Large (3+), 4+   Urobilinogen, UA     Nitrite, UA neg    Leukocytes, UA Negative Negative   Appearance     Odor      Assessment & Plan:  High-risk pregnancy: G2P1001 at [redacted]w[redacted]d with an Estimated Date of Delivery: 08/14/20   1) Class A2 DM, [facility] has not gotten her metformin and she had GI issues with it before so will start glyburide,   2) CHTN, no meds needed but with borderline elevated Doppler flow studies,   Meds:  Meds ordered this encounter  Medications  . glyBURIDE (DIABETA) 2.5 MG tablet    Sig: Take 1 tablet (2.5 mg total) by mouth 2 (two) times daily with a meal.  Dispense:  60 tablet    Refill:  3    Labs/procedures today: sonogram  Treatment Plan:  Twice weekly surveillance  Reviewed: Preterm labor symptoms and general obstetric precautions including but not limited to vaginal bleeding, contractions, leaking of fluid and fetal movement were reviewed in detail with the patient.  All questions were answered.  home bp cuff. Rx faxed to . Check bp weekly, let us know if >140/90.   Follow-up: Return in about 3 days (around 07/11/2020) for Nurse only.   Future Appointments  Date Time Provider Department Center  07/11/2020  9:50 AM [facility] NURSE [facility]  07/15/2020 11:00 AM [facility] [place] [facility] None  07/15/2020 11:50 AM Lazaro Arms, MD [facility]  07/18/2020  9:50 AM [facility] NURSE [facility]  07/22/2020 10:15 AM [facility] [place] [facility] None  07/22/2020 11:10 AM  Cheral Marker, CNM [facility]  07/25/2020  9:50 AM [facility] NURSE [facility]  07/29/2020  9:50 AM Cheral Marker, CNM [facility]  08/06/2020  9:30 AM [facility] [place] [facility] None  08/06/2020 10:30 AM Arabella Merles, CNM [facility]    Orders Placed This Encounter  Procedures  . POC Urinalysis Dipstick OB   Lazaro Arms  07/08/2020 11:39 AM

## 2020-07-08 NOTE — Progress Notes (Signed)
[place] 34+5 wks,cephalic,BPP 8/8,FHR 150 BPM,posterior placenta gr 3,RI .54,.54,.67,.66=87%,AFI 9.2 cm

## 2020-07-09 ENCOUNTER — Other Ambulatory Visit: Payer: Self-pay | Admitting: *Deleted

## 2020-07-09 ENCOUNTER — Telehealth: Payer: Self-pay | Admitting: Women's Health

## 2020-07-09 ENCOUNTER — Telehealth: Payer: Self-pay | Admitting: *Deleted

## 2020-07-09 DIAGNOSIS — O0993 Supervision of high risk pregnancy, unspecified, third trimester: Secondary | ICD-10-CM

## 2020-07-09 DIAGNOSIS — O24410 Gestational diabetes mellitus in pregnancy, diet controlled: Secondary | ICD-10-CM

## 2020-07-09 DIAGNOSIS — F411 Generalized anxiety disorder: Secondary | ICD-10-CM | POA: Diagnosis not present

## 2020-07-09 MED ORDER — ACCU-CHEK GUIDE VI STRP: ORAL_STRIP | 6 refills | Status: DC

## 2020-07-09 MED ORDER — ACCU-CHEK SOFTCLIX LANCETS MISC: 6 refills | Status: DC

## 2020-07-09 NOTE — Telephone Encounter (Signed)
Patient called stating that she would like speak with a nurse regarding her test strips. Pt states that she has about half a box or less of test strips. She states that she went to the pharmacy and they told her that the test strip is going to cost a certain amount of money and she can not afford it. Please contact pt

## 2020-07-09 NOTE — Telephone Encounter (Signed)
Patient states she is having trouble with [facility] and is requesting her lancets and strips be sent to [facility].

## 2020-07-11 ENCOUNTER — Other Ambulatory Visit: Payer: Self-pay

## 2020-07-11 ENCOUNTER — Ambulatory Visit (INDEPENDENT_AMBULATORY_CARE_PROVIDER_SITE_OTHER): Payer: Medicaid Other | Admitting: *Deleted

## 2020-07-11 VITALS — BP 135/86 | HR 95 | Wt 250.0 lb

## 2020-07-11 DIAGNOSIS — Z302 Encounter for sterilization: Secondary | ICD-10-CM

## 2020-07-11 DIAGNOSIS — I10 Essential (primary) hypertension: Secondary | ICD-10-CM | POA: Diagnosis not present

## 2020-07-11 DIAGNOSIS — O24419 Gestational diabetes mellitus in pregnancy, unspecified control: Secondary | ICD-10-CM | POA: Diagnosis not present

## 2020-07-11 DIAGNOSIS — Z331 Pregnant state, incidental: Secondary | ICD-10-CM

## 2020-07-11 DIAGNOSIS — O0993 Supervision of high risk pregnancy, unspecified, third trimester: Secondary | ICD-10-CM

## 2020-07-11 DIAGNOSIS — Z1389 Encounter for screening for other disorder: Secondary | ICD-10-CM

## 2020-07-11 DIAGNOSIS — Z3A35 35 weeks gestation of pregnancy: Secondary | ICD-10-CM | POA: Diagnosis not present

## 2020-07-11 LAB — POCT URINALYSIS DIPSTICK OB
Blood, UA: NEGATIVE
Glucose, UA: NEGATIVE
Ketones, UA: NEGATIVE
Nitrite, UA: NEGATIVE
POC,PROTEIN,UA: NEGATIVE

## 2020-07-11 NOTE — Progress Notes (Addendum)
   NURSE VISIT- NST  SUBJECTIVE:  Melanie Russo is a 24 y.o. G2P1001 female at [redacted]w[redacted]d, here for a NST for pregnancy complicated by [facility] and A2DM currently on Glyburide  She reports active fetal movement, contractions: none, vaginal bleeding: none, membranes: intact.   OBJECTIVE:  BP 135/86   Pulse 95   Wt 250 lb (113.4 kg)   LMP 11/08/2019 (Exact Date)   BMI 41.60 kg/m   Appears well, no apparent distress  Results for orders placed or performed in visit on 07/11/20 (from the past 24 hour(s))  POC Urinalysis Dipstick OB   Collection Time: 07/11/20 10:10 AM  Result Value Ref Range   Color, UA     Clarity, UA     Glucose, UA Negative Negative   Bilirubin, UA     Ketones, UA neg    Spec Grav, UA     Blood, UA neg    pH, UA     POC,PROTEIN,UA Negative Negative, Trace, Small (1+), Moderate (2+), Large (3+), 4+   Urobilinogen, UA     Nitrite, UA neg    Leukocytes, UA Trace (A) Negative   Appearance     Odor      NST: FHR baseline 140bpm, Variability: moderate, Accelerations:present, Decelerations:  Absent= Cat 1/Reactive Toco: none   ASSESSMENT: G2P1001 at [redacted]w[redacted]d with CHTN and A2DM currently on Glyburide NST reactive  PLAN: EFM strip reviewed by Philipp Deputy, CNM   Recommendations: keep next appointment as scheduled    [name]  07/11/2020 11:44 AM  Chart reviewed for nurse visit. Agree with plan of care.  Arabella Merles, CNM 07/11/2020 12:15 PM

## 2020-07-14 ENCOUNTER — Other Ambulatory Visit: Payer: Self-pay | Admitting: Women's Health

## 2020-07-14 DIAGNOSIS — O0993 Supervision of high risk pregnancy, unspecified, third trimester: Secondary | ICD-10-CM

## 2020-07-14 DIAGNOSIS — O24410 Gestational diabetes mellitus in pregnancy, diet controlled: Secondary | ICD-10-CM

## 2020-07-14 MED ORDER — ACCU-CHEK GUIDE VI STRP: ORAL_STRIP | 2 refills | Status: DC

## 2020-07-15 ENCOUNTER — Other Ambulatory Visit: Payer: Medicaid Other

## 2020-07-15 ENCOUNTER — Other Ambulatory Visit: Payer: Medicaid Other | Admitting: Obstetrics & Gynecology

## 2020-07-15 ENCOUNTER — Ambulatory Visit (INDEPENDENT_AMBULATORY_CARE_PROVIDER_SITE_OTHER): Payer: Medicaid Other

## 2020-07-15 ENCOUNTER — Encounter: Payer: Self-pay | Admitting: Obstetrics & Gynecology

## 2020-07-15 ENCOUNTER — Ambulatory Visit (INDEPENDENT_AMBULATORY_CARE_PROVIDER_SITE_OTHER): Payer: Medicaid Other | Admitting: Obstetrics & Gynecology

## 2020-07-15 ENCOUNTER — Other Ambulatory Visit: Payer: Self-pay

## 2020-07-15 VITALS — BP 137/92 | HR 99 | Wt 251.0 lb

## 2020-07-15 DIAGNOSIS — I10 Essential (primary) hypertension: Secondary | ICD-10-CM

## 2020-07-15 DIAGNOSIS — Z302 Encounter for sterilization: Secondary | ICD-10-CM

## 2020-07-15 DIAGNOSIS — O24419 Gestational diabetes mellitus in pregnancy, unspecified control: Secondary | ICD-10-CM | POA: Diagnosis not present

## 2020-07-15 DIAGNOSIS — Z331 Pregnant state, incidental: Secondary | ICD-10-CM

## 2020-07-15 DIAGNOSIS — O0993 Supervision of high risk pregnancy, unspecified, third trimester: Secondary | ICD-10-CM

## 2020-07-15 DIAGNOSIS — Z1389 Encounter for screening for other disorder: Secondary | ICD-10-CM

## 2020-07-15 DIAGNOSIS — Z3A35 35 weeks gestation of pregnancy: Secondary | ICD-10-CM

## 2020-07-15 LAB — POCT URINALYSIS DIPSTICK OB
Blood, UA: NEGATIVE
Glucose, UA: NEGATIVE
Ketones, UA: NEGATIVE
Leukocytes, UA: NEGATIVE
Nitrite, UA: NEGATIVE
POC,PROTEIN,UA: NEGATIVE

## 2020-07-15 NOTE — Progress Notes (Signed)
HIGH-RISK PREGNANCY VISIT Patient name: Melanie Russo MRN [id number]  Date of birth: [birth year] Chief Complaint:   High Risk Gestation ([place] today; baby is not very active now)  History of Present Illness:   Melanie Russo is a 24 y.o. G6P1001 female at [redacted]w[redacted]d with an Estimated Date of Delivery: 08/14/20 being seen today for ongoing management of a high-risk pregnancy complicated by chronic hypertension currently on no meds and diabetes mellitus A2DM currently on glyburide 2.5 mg BID.  Today she reports no complaints.  Depression screen [facility] 2/9 07/07/2020 06/20/2020 02/05/2020 12/14/2019  Decreased Interest 0 0 1 2  Down, Depressed, Hopeless 0 1 1 0  PHQ - 2 Score 0 1 2 2   Altered sleeping - 3 3 3   Tired, decreased energy - 3 3 3   Change in appetite - 0 3 1  Feeling bad or failure about yourself  - 0 1 0  Trouble concentrating - 0 1 2  Moving slowly or fidgety/restless - 0 0 0  Suicidal thoughts - 0 0 0  PHQ-9 Score - 7 13 11   Difficult doing work/chores - - - Not difficult at all    Contractions: Not present. Vag. Bleeding: None.  Movement: (!) Decreased. denies leaking of fluid.  Review of Systems:   Pertinent items are noted in HPI Denies abnormal vaginal discharge w/ itching/odor/irritation, headaches, visual changes, shortness of breath, chest pain, abdominal pain, severe nausea/vomiting, or problems with urination or bowel movements unless otherwise stated above. Pertinent History Reviewed:  Reviewed past medical,surgical, social, obstetrical and family history.  Reviewed problem list, medications and allergies. Physical Assessment:   Vitals:   07/15/20 1150  BP: (!) 137/92  Pulse: 99  Weight: 251 lb (113.9 kg)  Body mass index is 41.77 kg/m.           Physical Examination:   General appearance: alert, well appearing, and in no distress  Mental status: alert, oriented to person, place, and time  Skin: warm & dry   Extremities: Edema: Trace    Cardiovascular: normal  heart rate noted  Respiratory: normal respiratory effort, no distress  Abdomen: gravid, soft, non-tender  Pelvic: Cervical exam deferred         Fetal Status:     Movement: (!) Decreased    Fetal Surveillance Testing today: BPP 8/8 with normal Dopplers   Chaperone: N/A    Results for orders placed or performed in visit on 07/15/20 (from the past 24 hour(s))  POC Urinalysis Dipstick OB   Collection Time: 07/15/20 11:51 AM  Result Value Ref Range   Color, UA     Clarity, UA     Glucose, UA Negative Negative   Bilirubin, UA     Ketones, UA neg    Spec Grav, UA     Blood, UA neg    pH, UA     POC,PROTEIN,UA Negative Negative, Trace, Small (1+), Moderate (2+), Large (3+), 4+   Urobilinogen, UA     Nitrite, UA neg    Leukocytes, UA Negative Negative   Appearance     Odor      Assessment & Plan:  High-risk pregnancy: G2P1001 at [redacted]w[redacted]d with an Estimated Date of Delivery: 08/14/20   1) CHTN no meds, still no meds required at this level, continue twice weekly surveillance,   2) A2DM, on glyburide 2.5 BID  Meds: No orders of the defined types were placed in this encounter.   Labs/procedures today: sonogram  Treatment Plan:  contineu twice weekly surveillance  Reviewed: Preterm labor symptoms and general obstetric precautions including but not limited to vaginal bleeding, contractions, leaking of fluid and fetal movement were reviewed in detail with the patient.  All questions were answered. Has home bp cuff. Rx faxed to . Check bp weekly, let us know if >140/90.   Follow-up: Return for keep scheduled.   Future Appointments  Date Time Provider Department Center  07/18/2020  9:50 AM [facility] NURSE [facility]  07/22/2020 10:15 AM [facility] [place] [facility] None  07/22/2020 11:10 AM Cheral Marker, CNM [facility]  07/25/2020  9:50 AM [facility] NURSE [facility]  07/29/2020  9:50 AM Cheral Marker, CNM [facility]  08/06/2020  9:30 AM [facility] [place]  [facility] None  08/06/2020 10:30 AM Arabella Merles, CNM [facility]    Orders Placed This Encounter  Procedures  . POC Urinalysis Dipstick OB   Lazaro Arms  07/15/2020 12:04 PM

## 2020-07-15 NOTE — Progress Notes (Signed)
[place] 35+5 wks,cephalic,posterior placenta gr 3,AFI 11.6 cm,FHR 142 bpm,BPP 8/8

## 2020-07-16 ENCOUNTER — Other Ambulatory Visit: Payer: Self-pay

## 2020-07-16 ENCOUNTER — Encounter (HOSPITAL_COMMUNITY): Payer: Self-pay | Admitting: Obstetrics & Gynecology

## 2020-07-16 ENCOUNTER — Inpatient Hospital Stay (HOSPITAL_COMMUNITY)
Admission: AD | Admit: 2020-07-16 | Discharge: 2020-07-16 | Disposition: A | Discharge status: Home / Self Care | Payer: Medicaid Other | Attending: Obstetrics & Gynecology | Admitting: Obstetrics & Gynecology

## 2020-07-16 DIAGNOSIS — O24415 Gestational diabetes mellitus in pregnancy, controlled by oral hypoglycemic drugs: Secondary | ICD-10-CM | POA: Insufficient documentation

## 2020-07-16 DIAGNOSIS — O26893 Other specified pregnancy related conditions, third trimester: Secondary | ICD-10-CM | POA: Diagnosis not present

## 2020-07-16 DIAGNOSIS — R109 Unspecified abdominal pain: Secondary | ICD-10-CM | POA: Insufficient documentation

## 2020-07-16 DIAGNOSIS — Z3A35 35 weeks gestation of pregnancy: Secondary | ICD-10-CM | POA: Diagnosis not present

## 2020-07-16 DIAGNOSIS — O4700 False labor before 37 completed weeks of gestation, unspecified trimester: Secondary | ICD-10-CM

## 2020-07-16 DIAGNOSIS — Z880 Allergy status to penicillin: Secondary | ICD-10-CM | POA: Insufficient documentation

## 2020-07-16 DIAGNOSIS — Z7982 Long term (current) use of aspirin: Secondary | ICD-10-CM | POA: Insufficient documentation

## 2020-07-16 DIAGNOSIS — O368130 Decreased fetal movements, third trimester, not applicable or unspecified: Secondary | ICD-10-CM | POA: Insufficient documentation

## 2020-07-16 DIAGNOSIS — Z3A36 36 weeks gestation of pregnancy: Secondary | ICD-10-CM | POA: Diagnosis not present

## 2020-07-16 DIAGNOSIS — Z79899 Other long term (current) drug therapy: Secondary | ICD-10-CM | POA: Diagnosis not present

## 2020-07-16 HISTORY — DX: Gestational diabetes mellitus in pregnancy, unspecified control: O24.419

## 2020-07-16 NOTE — MAU Note (Signed)
Patient here for abdominal cramping that has lasted all day and hasn't felt any fetal movement for the past few hours (FHR dopplered 160s).  Denies VB/LOF.  States she has gestational diabetes and had an episode of hypoglycemia earlier.  Uncertain whether the cramping feels like contractions, hasn't timed them.

## 2020-07-16 NOTE — Progress Notes (Signed)
Cardiology Office Note:    Date:  07/18/2020   ID:  Melanie Russo, DOB 12-18-1995, MRN [id number]  PCP:  Roma Schanz, CNM  Cardiologist:  No primary care provider on file.   Referring MD: Roma Schanz, CNM   Chief Complaint  Patient presents with  . Hospitalization Follow-up    Rapid heart rate    History of Present Illness:    Melanie Russo is a 24 y.o. female with a hx of sinus tachycardia in third trimester of high risk pregnancy.  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  She is at [employer]  This is her second pregnancy.  There is a concern with this pregnancy because of tachycardia.  She had no such issue during her first pregnancy.  She occasionally feels a palpitation but generally there is no increased awareness of rapid heartbeat or palpitation.  She has been having some issues with contractions.  She has not had edema.  No significant headaches.  Blood pressures have been relatively normal.  She has no preceding history of heart murmur or heart disease.  She is in no distress today, denying chest pain, dyspnea, and edema.  An EKG performed in October revealed a heart rate of 114 bpm and appeared to be sinus tachycardia and otherwise unremarkable.  The check-in heart rate today was 137 bpm.  Past Medical History:  Diagnosis Date  . Anemia   . Arthritis   . Gestational diabetes   . Hypertension    gestational   . Sjogren's syndrome [facility])     Past Surgical History:  Procedure Laterality Date  . NO PAST SURGERIES      Current Medications: Current Meds  Medication Sig  . Accu-Chek Softclix Lancets lancets Use as instructed to check blood sugar 4 times daily  . acetaminophen (TYLENOL) 500 MG tablet Take 1,000 mg by mouth every 6 (six) hours as needed.  Marland Kitchen aspirin 81 MG chewable tablet Chew 162 mg by mouth daily.  . Blood Glucose Monitoring Suppl (ACCU-CHEK GUIDE ME)  w/Device KIT 1 each by Does not apply route 4 (four) times daily.  . Blood Pressure Monitor MISC For regular home bp monitoring during pregnancy  . butalbital-acetaminophen-caffeine (FIORICET) 50-325-40 MG tablet Take 1-2 tablets by mouth every 6 (six) hours as needed for headache. Take 1-2 tablets by mouth every 6 (six) hours as needed for headache.  . ferrous sulfate 325 (65 FE) MG tablet Take 1 tablet (325 mg total) by mouth 2 (two) times daily with a meal.  . glucose blood (ACCU-CHEK GUIDE) test strip Use as instructed to check blood sugar 4 times daily  . glyBURIDE (DIABETA) 2.5 MG tablet Take 1 tablet (2.5 mg total) by mouth 2 (two) times daily with a meal.  . ondansetron (ZOFRAN ODT) 4 MG disintegrating tablet Take 1 tablet (4 mg total) by mouth every 6 (six) hours as needed for nausea.  . pantoprazole (PROTONIX) 20 MG tablet Take 1 tablet (20 mg total) by mouth daily.  . Prenatal Vit-Fe Fumarate-FA (MULTIVITAMIN-PRENATAL) 27-0.8 MG TABS tablet Take 1 tablet by mouth daily at 12 noon.     Allergies:   Kiwi extract, Other, Latex, and Penicillins   Social History   Socioeconomic History  . Marital status: Married    Spouse name: Not on file  . Number of children: Not on file  . Years of education: Not on file  . Highest education level: Not on file  Occupational History  . Not on file  Tobacco Use  . Smoking status: Never Smoker  . Smokeless tobacco: Never Used  Vaping Use  . Vaping Use: Never used  Substance and Sexual Activity  . Alcohol use: Not Currently    Comment: rarely  . Drug use: Never  . Sexual activity: Not Currently    Birth control/protection: None    Comment: pt reports it has been over a month - 06/26/2020  Other Topics Concern  . Not on file  Social History Narrative  . Not on file   Social Determinants of Health   Financial Resource Strain: Low Risk   . Difficulty of Paying Living Expenses: Not hard at all  Food Insecurity: No Food Insecurity  .  Worried About Charity fundraiser in the Last Year: Never true  . Ran Out of Food in the Last Year: Never true  Transportation Needs: No Transportation Needs  . Lack of Transportation (Medical): No  . Lack of Transportation (Non-Medical): No  Physical Activity: Insufficiently Active  . Days of Exercise per Week: 1 day  . Minutes of Exercise per Session: 30 min  Stress: Stress Concern Present  . Feeling of Stress : Very much  Social Connections: Moderately Isolated  . Frequency of Communication with Friends and Family: More than three times a week  . Frequency of Social Gatherings with Friends and Family: Never  . Attends Religious Services: Never  . Active Member of Clubs or Organizations: No  . Attends Archivist Meetings: Never  . Marital Status: Married     Family History: The patient's family history includes Arthritis in her father; Cancer in her maternal grandmother; Heart disease in her paternal grandmother; Hypertension in her father and paternal grandmother; Obesity in her maternal uncle; Thyroid disease in her paternal grandfather and paternal grandmother.  ROS:   Please see the history of present illness.    She has been having some difficulty with contractions.  But she is not dilated to any significant degree.  She had a neonatal stress test today.  All other systems reviewed and are negative.  EKGs/Labs/Other Studies Reviewed:    The following studies were reviewed today: No cardiac data  EKG:  EKG performed on 05/12/2020 demonstrates sinus tachycardia but otherwise normal.  Recent Labs: 05/09/2020: TSH 1.259 06/20/2020: Hemoglobin 10.8; Platelets 238 07/02/2020: ALT 32; BUN 5; Creatinine, Ser 0.45; Potassium 4.3; Sodium 134  Recent Lipid Panel No results found for: CHOL, TRIG, HDL, CHOLHDL, VLDL, LDLCALC, LDLDIRECT  Physical Exam:    VS:  BP 124/70   Pulse (!) 137   Ht _0  (1.651 m)   Wt 250 lb (113.4 kg)   LMP 11/08/2019 (Exact Date)   SpO2 98%    BMI 41.60 kg/m     Wt Readings from Last 3 Encounters:  07/18/20 250 lb (113.4 kg)  07/15/20 251 lb (113.9 kg)  07/11/20 250 lb (113.4 kg)     GEN: Pregnant. No acute distress HEENT: Normal NECK: No JVD. LYMPHATICS: No lymphadenopathy CARDIAC:  RRR without murmur, gallop, or edema.  On cardiac exam with the patient sitting quietly in the room, heart rates were less than 100 bpm. VASCULAR:  Normal Pulses. No bruits. RESPIRATORY:  Clear to auscultation without rales, wheezing or rhonchi  ABDOMEN: Soft, non-tender, non-distended, No pulsatile mass, MUSCULOSKELETAL: No deformity  SKIN: Warm and dry NEUROLOGIC:  Alert and oriented x 3 PSYCHIATRIC:  Normal affect   ASSESSMENT:    1. Tachycardia  2. Chronic hypertension   3. Encounter for supervision of high risk pregnancy in third trimester, antepartum   4. Gestational diabetes mellitus, class A2   5. Educated about COVID-19 virus infection    PLAN:    In order of problems listed above:  1. Uncertain cause for the higher than normal heart rates.  No clinical findings to suggest left ventricular dysfunction.  Under the circumstances an echocardiogram should be done to exclude systolic dysfunction.  There is auscultatory evidence of left ventricular dysfunction.  Neck veins are flat and has no clinical evidence of volume overload.  Suspected heart rate is related to sympathetic tone.  Echo will help exclude peripartum left ventricular dysfunction. 2. Blood pressure is controlled today. 3. No action 4. No action 5. No symptoms to suggest Covid.   Medication Adjustments/Labs and Tests Ordered: Current medicines are reviewed at length with the patient today.  Concerns regarding medicines are outlined above.  Orders Placed This Encounter  Procedures  . ECHOCARDIOGRAM COMPLETE   No orders of the defined types were placed in this encounter.   Patient Instructions  Medication Instructions:  Your physician recommends that you  continue on your current medications as directed. Please refer to the Current Medication list given to you today.  *If you need a refill on your cardiac medications before your next appointment, please call your pharmacy*   Lab Work: None If you have labs (blood work) drawn today and your tests are completely normal, you will receive your results only by: Marland Kitchen MyChart Message (if you have MyChart) OR . A paper copy in the mail If you have any lab test that is abnormal or we need to change your treatment, we will call you to review the results.   Testing/Procedures: Your physician has requested that you have an echocardiogram STAT (can be done anywhere with the soonest appointment). Echocardiography is a painless test that uses sound waves to create images of your heart. It provides your doctor with information about the size and shape of your heart and how well your heart's chambers and valves are working. This procedure takes approximately one hour. There are no restrictions for this procedure.     Follow-Up: At [facility], you and your health needs are our priority.  As part of our continuing mission to provide you with exceptional heart care, we have created designated Provider Care Teams.  These Care Teams include your primary Cardiologist (physician) and Advanced Practice Providers (APPs -  Physician Assistants and Nurse Practitioners) who all work together to provide you with the care you need, when you need it.  We recommend signing up for the patient portal called "MyChart".  Sign up information is provided on this After Visit Summary.  MyChart is used to connect with patients for Virtual Visits (Telemedicine).  Patients are able to view lab/test results, encounter notes, upcoming appointments, etc.  Non-urgent messages can be sent to your provider as well.   To learn more about what you can do with MyChart, go to [email or web address].    Your next appointment:   As  needed  The format for your next appointment:   In Person  Provider:   You may see Dr. Daneen Schick or one of the following Advanced Practice Providers on your designated Care Team:    Truitt Merle, NP  Cecilie Kicks, NP  Kathyrn Drown, NP    Other Instructions      Signed, Sinclair Grooms, MD  07/18/2020 4:32 PM  [facility]

## 2020-07-16 NOTE — MAU Provider Note (Signed)
Chief Complaint:  Decreased Fetal Movement and Abdominal Pain   First Provider Initiated Contact with Patient 07/16/20 2009     HPI  HPI: Melanie Russo is a 24 y.o. G2P1001 at 59w6dho presents to maternity admissions reporting abdominal pain all day. .Also was feeling decreased movement, though baby is moving well now She reports good fetal movement, denies LOF, vaginal bleeding, vaginal itching/burning, urinary symptoms, h/a, dizziness, n/v, diarrhea, constipation or fever/chills.  She denies headache, visual changes or RUQ abdominal pain.  RN note: Patient here for abdominal cramping that has lasted all day and hasn't felt any fetal movement for the past few hours (FHR dopplered 160s).  Denies VB/LOF.  States she has gestational diabetes and had an episode of hypoglycemia earlier.  Uncertain whether the cramping feels like contractions, hasn't timed them.  Past Medical History: Past Medical History:  Diagnosis Date  . Anemia   . Arthritis   . Gestational diabetes   . Hypertension    gestational   . S[place]ogren's syndrome ([place]     Past obstetric history: OB History  Gravida Para Term Preterm AB Living  '2 1 1     1  ' SAB TAB Ectopic Multiple Live Births          1    # Outcome Date GA Lbr Len/2nd Weight Sex Delivery Anes PTL Lv  2 Current           1 Term 08/16/14 [id number] g M Vag-Vacuum EPI N LIV     Complications: Gestational hypertension    Past Surgical History: Past Surgical History:  Procedure Laterality Date  . NO PAST SURGERIES      Family History: Family History  Problem Relation Age of Onset  . Hypertension Father   . Arthritis Father   . Obesity Maternal Uncle   . Cancer Maternal Grandmother        brain  . Heart disease Paternal Grandmother   . Hypertension Paternal Grandmother   . Thyroid disease Paternal Grandmother   . Thyroid disease Paternal Grandfather     Social History: Social History   Tobacco Use  . Smoking status: Never Smoker  .  Smokeless tobacco: Never Used  Vaping Use  . Vaping Use: Never used  Substance Use Topics  . Alcohol use: Not Currently    Comment: rarely  . Drug use: Never    Allergies:  Allergies  Allergen Reactions  . Kiwi Extract Anaphylaxis  . Other Anaphylaxis    Blueberries, blackberries, raspberries, nuts- pt reports that it feels like she is being stabbed in the mouth when eating nuts   . Latex   . Penicillins     Meds:  Medications Prior to Admission  Medication Sig Dispense Refill Last Dose  . Accu-Chek Softclix Lancets lancets Use as instructed to check blood sugar 4 times daily 100 each 6 07/16/2020 at Unknown time  . acetaminophen (TYLENOL) 500 MG tablet Take 1,000 mg by mouth every 6 (six) hours as needed.   07/16/2020 at Unknown time  . Blood Glucose Monitoring Suppl (ACCU-CHEK GUIDE ME) w/Device KIT 1 each by Does not apply route 4 (four) times daily. 1 kit 0 07/16/2020 at Unknown time  . Blood Pressure Monitor MISC For regular home bp monitoring during pregnancy 1 each 0 07/16/2020 at Unknown time  . butalbital-acetaminophen-caffeine (FIORICET) 50-325-40 MG tablet Take 1-2 tablets by mouth every 6 (six) hours as needed for headache. Take 1-2 tablets by mouth every 6 (six) hours as needed for  headache.   07/15/2020 at Unknown time  . ferrous sulfate 325 (65 FE) MG tablet Take 1 tablet (325 mg total) by mouth 2 (two) times daily with a meal. 60 tablet 3 07/16/2020 at Unknown time  . glyBURIDE (DIABETA) 2.5 MG tablet Take 1 tablet (2.5 mg total) by mouth 2 (two) times daily with a meal. 60 tablet 3 07/16/2020 at Unknown time  . ondansetron (ZOFRAN ODT) 4 MG disintegrating tablet Take 1 tablet (4 mg total) by mouth every 6 (six) hours as needed for nausea. 30 tablet 2 07/16/2020 at Unknown time  . pantoprazole (PROTONIX) 20 MG tablet Take 1 tablet (20 mg total) by mouth daily. 30 tablet 6 07/16/2020 at Unknown time  . Prenatal Vit-Fe Fumarate-FA (MULTIVITAMIN-PRENATAL) 27-0.8 MG TABS tablet Take  1 tablet by mouth daily at 12 noon.   07/16/2020 at Unknown time  . aspirin 81 MG chewable tablet Chew 162 mg by mouth daily.     Marland Kitchen glucose blood (ACCU-CHEK GUIDE) test strip Use as instructed to check blood sugar 4 times daily 200 each 2     I have reviewed patient's Past Medical Hx, Surgical Hx, Family Hx, Social Hx, medications and allergies.   ROS:  Review of Systems  Constitutional: Negative for chills and fever.  Respiratory: Negative for shortness of breath.   Gastrointestinal: Positive for abdominal pain. Negative for constipation, diarrhea, nausea and vomiting.  Genitourinary: Negative for vaginal bleeding.   Other systems negative  Physical Exam   Patient Vitals for the past 24 hrs:  Temp Temp src Resp  07/16/20 1856 98.4 F (36.9 C) Oral 18   Constitutional: Well-developed, well-nourished female in no acute distress.  Cardiovascular: normal rate and rhythm Respiratory: normal effort, clear to auscultation bilaterally GI: Abd soft, non-tender, gravid appropriate for gestational age.   No rebound or guarding. MS: Extremities nontender, no edema, normal ROM Neurologic: Alert and oriented x 4.  GU: Neg CVAT.  PELVIC EXAM:  Dilation: Fingertip Effacement (%): 60 Exam by:: Hansel Feinstein, cnm  FHT:  Baseline 140 , moderate variability, accelerations present, no decelerations Contractions:  Irregular     Labs: No results found for this or any previous visit (from the past 24 hour(s)).  B/Positive/-- (06/29 1058)  Imaging:    MAU Course/MDM: I have ordered labs and reviewed results.  NST reviewed, reactive,  Explained reassuring findings to patient Cervix thinning,    Treatments in MAU included EFM.    Assessment: Single IUP at 56w0dPreterm contractions, not in labor Decreased fetal movement, now moving, reassuring NST  Plan: Discharge home Discussed fetal movment Labor precautions and fetal kick counts Follow up in Office for prenatal visits and  recheck cervix  Encouraged to return if she develops worsening of symptoms, increase in pain, fever, or other concerning symptoms.   Pt stable at time of discharge.  MHansel FeinsteinCNM, MSN Certified Nurse-Midwife 07/16/2020 8:14 PM

## 2020-07-16 NOTE — Discharge Instructions (Signed)
Preterm Labor and Birth Information ° °The normal length of a pregnancy is 39-41 weeks. Preterm labor is when labor starts before 37 completed weeks of pregnancy. °What are the risk factors for preterm labor? °Preterm labor is more likely to occur in women who: °· Have certain infections during pregnancy such as a bladder infection, sexually transmitted infection, or infection inside the uterus (chorioamnionitis). °· Have a shorter-than-normal cervix. °· Have gone into preterm labor before. °· Have had surgery on their cervix. °· Are younger than age 17 or older than age 35. °· Are African American. °· Are pregnant with twins or multiple babies (multiple gestation). °· Take street drugs or smoke while pregnant. °· Do not gain enough weight while pregnant. °· Became pregnant shortly after having been pregnant. °What are the symptoms of preterm labor? °Symptoms of preterm labor include: °· Cramps similar to those that can happen during a menstrual period. The cramps may happen with diarrhea. °· Pain in the abdomen or lower back. °· Regular uterine contractions that may feel like tightening of the abdomen. °· A feeling of increased pressure in the pelvis. °· Increased watery or bloody mucus discharge from the vagina. °· Water breaking (ruptured amniotic sac). °Why is it important to recognize signs of preterm labor? °It is important to recognize signs of preterm labor because babies who are born prematurely may not be fully developed. This can put them at an increased risk for: °· Long-term (chronic) heart and lung problems. °· Difficulty immediately after birth with regulating body systems, including blood sugar, body temperature, heart rate, and breathing rate. °· Bleeding in the brain. °· Cerebral palsy. °· Learning difficulties. °· Death. °These risks are highest for babies who are born before 34 weeks of pregnancy. °How is preterm labor treated? °Treatment depends on the length of your pregnancy, your condition,  and the health of your baby. It may involve: °· Having a stitch (suture) placed in your cervix to prevent your cervix from opening too early (cerclage). °· Taking or being given medicines, such as: °? Hormone medicines. These may be given early in pregnancy to help support the pregnancy. °? Medicine to stop contractions. °? Medicines to help mature the baby’s lungs. These may be prescribed if the risk of delivery is high. °? Medicines to prevent your baby from developing cerebral palsy. °If the labor happens before 34 weeks of pregnancy, you may need to stay in the hospital. °What should I do if I think I am in preterm labor? °If you think that you are going into preterm labor, call your health care provider right away. °How can I prevent preterm labor in future pregnancies? °To increase your chance of having a full-term pregnancy: °· Do not use any tobacco products, such as cigarettes, chewing tobacco, and e-cigarettes. If you need help quitting, ask your health care provider. °· Do not use street drugs or medicines that have not been prescribed to you during your pregnancy. °· Talk with your health care provider before taking any herbal supplements, even if you have been taking them regularly. °· Make sure you gain a healthy amount of weight during your pregnancy. °· Watch for infection. If you think that you might have an infection, get it checked right away. °· Make sure to tell your health care provider if you have gone into preterm labor before. °This information is not intended to replace advice given to you by your health care provider. Make sure you discuss any questions you have with your   health care provider. °Document Revised: 11/17/2018 Document Reviewed: 12/17/2015 °Elsevier Patient Education © 2020 [employer] Inc. ° °

## 2020-07-18 ENCOUNTER — Ambulatory Visit (INDEPENDENT_AMBULATORY_CARE_PROVIDER_SITE_OTHER): Payer: Medicaid Other | Admitting: Interventional Cardiology

## 2020-07-18 ENCOUNTER — Other Ambulatory Visit: Payer: Self-pay

## 2020-07-18 ENCOUNTER — Encounter: Payer: Self-pay | Admitting: Interventional Cardiology

## 2020-07-18 ENCOUNTER — Ambulatory Visit (INDEPENDENT_AMBULATORY_CARE_PROVIDER_SITE_OTHER): Payer: Medicaid Other | Admitting: *Deleted

## 2020-07-18 VITALS — BP 124/70 | HR 137 | Ht 65.0 in | Wt 250.0 lb

## 2020-07-18 DIAGNOSIS — O24419 Gestational diabetes mellitus in pregnancy, unspecified control: Secondary | ICD-10-CM | POA: Diagnosis not present

## 2020-07-18 DIAGNOSIS — I10 Essential (primary) hypertension: Secondary | ICD-10-CM

## 2020-07-18 DIAGNOSIS — Z3A36 36 weeks gestation of pregnancy: Secondary | ICD-10-CM

## 2020-07-18 DIAGNOSIS — O0993 Supervision of high risk pregnancy, unspecified, third trimester: Secondary | ICD-10-CM

## 2020-07-18 DIAGNOSIS — Z7189 Other specified counseling: Secondary | ICD-10-CM | POA: Diagnosis not present

## 2020-07-18 DIAGNOSIS — R000 Tachycardia, unspecified: Secondary | ICD-10-CM | POA: Diagnosis not present

## 2020-07-18 NOTE — Progress Notes (Addendum)
° °  NURSE VISIT- NST  SUBJECTIVE:  Melanie Russo is a 24 y.o. G77P1001 female at [redacted]w[redacted]d, here for a NST for pregnancy complicated by [facility] and A2DM currently on Glyburide.  She reports active fetal movement, contractions: none, vaginal bleeding: none, membranes: intact.   OBJECTIVE:  BP 125/85    Pulse (!) 130    LMP 11/08/2019 (Exact Date)   Appears well, no apparent distress  No results found for this or any previous visit (from the past 24 hour(s)).  NST: FHR baseline 140 bpm, Variability: moderate, Accelerations:present, Decelerations:  Absent= Cat 1 /Reactive Toco: none   ASSESSMENT: G2P1001 at [redacted]w[redacted]d with CHTN and A2DM currently on Glyburide NST reactive  PLAN: EFM strip reviewed by Dr. Despina Hidden   Recommendations: keep next appointment as scheduled    [name]  07/18/2020 1:47 PM Attestation of Attending Supervision of Nursing Visit Encounter: Evaluation and management procedures were performed by the nursing staff under my supervision and collaboration.  I have reviewed the nurse's note and chart, and I agree with the management and plan.  Rockne Coons MD Attending Physician for the Center for [facility] 07/21/2020 8:02 AM

## 2020-07-18 NOTE — Patient Instructions (Signed)
Medication Instructions:  Your physician recommends that you continue on your current medications as directed. Please refer to the Current Medication list given to you today.  *If you need a refill on your cardiac medications before your next appointment, please call your pharmacy*   Lab Work: None If you have labs (blood work) drawn today and your tests are completely normal, you will receive your results only by: Marland Kitchen MyChart Message (if you have MyChart) OR . A paper copy in the mail If you have any lab test that is abnormal or we need to change your treatment, we will call you to review the results.   Testing/Procedures: Your physician has requested that you have an echocardiogram STAT (can be done anywhere with the soonest appointment). Echocardiography is a painless test that uses sound waves to create images of your heart. It provides your doctor with information about the size and shape of your heart and how well your heart's chambers and valves are working. This procedure takes approximately one hour. There are no restrictions for this procedure.     Follow-Up: At [facility], you and your health needs are our priority.  As part of our continuing mission to provide you with exceptional heart care, we have created designated Provider Care Teams.  These Care Teams include your primary Cardiologist (physician) and Advanced Practice Providers (APPs -  Physician Assistants and Nurse Practitioners) who all work together to provide you with the care you need, when you need it.  We recommend signing up for the patient portal called "MyChart".  Sign up information is provided on this After Visit Summary.  MyChart is used to connect with patients for Virtual Visits (Telemedicine).  Patients are able to view lab/test results, encounter notes, upcoming appointments, etc.  Non-urgent messages can be sent to your provider as well.   To learn more about what you can do with MyChart, go to  [email or web address].    Your next appointment:   As needed  The format for your next appointment:   In Person  Provider:   You may see Dr. Verdis Prime or one of the following Advanced Practice Providers on your designated Care Team:    Norma Fredrickson, NP  Nada Boozer, NP  Georgie Chard, NP    Other Instructions

## 2020-07-19 ENCOUNTER — Other Ambulatory Visit: Payer: Self-pay

## 2020-07-19 ENCOUNTER — Encounter (HOSPITAL_COMMUNITY): Payer: Self-pay | Admitting: Obstetrics and Gynecology

## 2020-07-19 ENCOUNTER — Inpatient Hospital Stay (HOSPITAL_COMMUNITY)
Admission: AD | Admit: 2020-07-19 | Discharge: 2020-07-19 | Disposition: A | Discharge status: Home / Self Care | Payer: Medicaid Other | Attending: Obstetrics and Gynecology | Admitting: Obstetrics and Gynecology

## 2020-07-19 DIAGNOSIS — R110 Nausea: Secondary | ICD-10-CM | POA: Insufficient documentation

## 2020-07-19 DIAGNOSIS — R1012 Left upper quadrant pain: Secondary | ICD-10-CM | POA: Diagnosis not present

## 2020-07-19 DIAGNOSIS — O26893 Other specified pregnancy related conditions, third trimester: Secondary | ICD-10-CM | POA: Insufficient documentation

## 2020-07-19 DIAGNOSIS — O368130 Decreased fetal movements, third trimester, not applicable or unspecified: Secondary | ICD-10-CM | POA: Insufficient documentation

## 2020-07-19 DIAGNOSIS — Z3689 Encounter for other specified antenatal screening: Secondary | ICD-10-CM

## 2020-07-19 DIAGNOSIS — K219 Gastro-esophageal reflux disease without esophagitis: Secondary | ICD-10-CM | POA: Insufficient documentation

## 2020-07-19 DIAGNOSIS — O99613 Diseases of the digestive system complicating pregnancy, third trimester: Secondary | ICD-10-CM | POA: Diagnosis not present

## 2020-07-19 DIAGNOSIS — O99891 Other specified diseases and conditions complicating pregnancy: Secondary | ICD-10-CM | POA: Diagnosis not present

## 2020-07-19 DIAGNOSIS — Z880 Allergy status to penicillin: Secondary | ICD-10-CM | POA: Diagnosis not present

## 2020-07-19 DIAGNOSIS — O10913 Unspecified pre-existing hypertension complicating pregnancy, third trimester: Secondary | ICD-10-CM | POA: Diagnosis not present

## 2020-07-19 DIAGNOSIS — Z7982 Long term (current) use of aspirin: Secondary | ICD-10-CM | POA: Diagnosis not present

## 2020-07-19 DIAGNOSIS — Z7984 Long term (current) use of oral hypoglycemic drugs: Secondary | ICD-10-CM | POA: Diagnosis not present

## 2020-07-19 DIAGNOSIS — Z79899 Other long term (current) drug therapy: Secondary | ICD-10-CM | POA: Diagnosis not present

## 2020-07-19 DIAGNOSIS — Z8719 Personal history of other diseases of the digestive system: Secondary | ICD-10-CM | POA: Diagnosis not present

## 2020-07-19 DIAGNOSIS — Z8249 Family history of ischemic heart disease and other diseases of the circulatory system: Secondary | ICD-10-CM | POA: Insufficient documentation

## 2020-07-19 DIAGNOSIS — Z3A36 36 weeks gestation of pregnancy: Secondary | ICD-10-CM

## 2020-07-19 LAB — URINALYSIS, ROUTINE W REFLEX MICROSCOPIC
Bilirubin Urine: NEGATIVE
Glucose, UA: NEGATIVE mg/dL
Hgb urine dipstick: NEGATIVE
Ketones, ur: 20 mg/dL — AB
Nitrite: NEGATIVE
Protein, ur: NEGATIVE mg/dL
Specific Gravity, Urine: 1.01 (ref 1.005–1.030)
pH: 6 (ref 5.0–8.0)

## 2020-07-19 LAB — COMPREHENSIVE METABOLIC PANEL
ALT: 62 U/L — ABNORMAL HIGH (ref 0–44)
AST: 35 U/L (ref 15–41)
Albumin: 2.6 g/dL — ABNORMAL LOW (ref 3.5–5.0)
Alkaline Phosphatase: 185 U/L — ABNORMAL HIGH (ref 38–126)
Anion gap: 9 (ref 5–15)
BUN: <5 mg/dL — ABNORMAL LOW (ref 6–20)
CO2: 21 mmol/L — ABNORMAL LOW (ref 22–32)
Calcium: 8.9 mg/dL (ref 8.9–10.3)
Chloride: 106 mmol/L (ref 98–111)
Creatinine, Ser: 0.56 mg/dL (ref 0.44–1.00)
GFR, Estimated: >60 mL/min (ref >60)
Glucose, Bld: 88 mg/dL (ref 70–99)
Potassium: 3.4 mmol/L — ABNORMAL LOW (ref 3.5–5.1)
Sodium: 136 mmol/L (ref 135–145)
Total Bilirubin: 0.8 mg/dL (ref 0.3–1.2)
Total Protein: 6.1 g/dL — ABNORMAL LOW (ref 6.5–8.1)

## 2020-07-19 LAB — CBC WITH DIFFERENTIAL/PLATELET
Abs Immature Granulocytes: 0.03 10*3/uL (ref 0.00–0.07)
Basophils Absolute: 0 10*3/uL (ref 0.0–0.1)
Basophils Relative: 0 %
Eosinophils Absolute: 0 10*3/uL (ref 0.0–0.5)
Eosinophils Relative: 1 %
HCT: 34.2 % — ABNORMAL LOW (ref 36.0–46.0)
Hemoglobin: 11.1 g/dL — ABNORMAL LOW (ref 12.0–15.0)
Immature Granulocytes: 1 %
Lymphocytes Relative: 18 %
Lymphs Abs: 1.2 10*3/uL (ref 0.7–4.0)
MCH: 26.7 pg (ref 26.0–34.0)
MCHC: 32.5 g/dL (ref 30.0–36.0)
MCV: 82.2 fL (ref 80.0–100.0)
Monocytes Absolute: 0.7 10*3/uL (ref 0.1–1.0)
Monocytes Relative: 10 %
Neutro Abs: 4.6 10*3/uL (ref 1.7–7.7)
Neutrophils Relative %: 70 %
Platelets: 243 10*3/uL (ref 150–400)
RBC: 4.16 MIL/uL (ref 3.87–5.11)
RDW: 16.8 % — ABNORMAL HIGH (ref 11.5–15.5)
WBC: 6.5 10*3/uL (ref 4.0–10.5)
nRBC: 0 % (ref 0.0–0.2)

## 2020-07-19 MED ORDER — ONDANSETRON HCL 4 MG PO TABS
8.0000 mg | ORAL_TABLET | Freq: Once | ORAL | Status: AC
  Administered 2020-07-19: 22:00:00 8 mg via ORAL
  Filled 2020-07-19: qty 2

## 2020-07-19 MED ORDER — PANTOPRAZOLE SODIUM 40 MG PO TBEC: 40.0000 mg | DELAYED_RELEASE_TABLET | Freq: Every day | ORAL | 1 refills | Status: DC

## 2020-07-19 MED ORDER — FAMOTIDINE 20 MG PO TABS
40.0000 mg | ORAL_TABLET | Freq: Once | ORAL | Status: AC
  Administered 2020-07-19: 22:00:00 40 mg via ORAL
  Filled 2020-07-19: qty 2

## 2020-07-19 NOTE — Discharge Instructions (Signed)

## 2020-07-19 NOTE — MAU Note (Signed)
Pt states that she has not felt baby move much today however felt movement on the way to the hospital.  Pt c/o epigastric pain that is intermittent.  Pt also c/o N/V that Zofran has not been effective.

## 2020-07-19 NOTE — MAU Provider Note (Signed)
History     CSN: [id number]  Arrival date and time: 07/19/20 2029   Event Date/Time   First Provider Initiated Contact with Patient 07/19/20 2128      Chief Complaint  Patient presents with  . Decreased Fetal Movement   HPI   [name] is a 24 y.o. female G78P1001 @ 20w2dwith a history of A2GDM and CHTN (no meds) here with upper abdominal pain. The pain is worse in the left upper quadrant, however is really all over her upper abdomen. She reports this pain has been off and on for a few days. She reports Nausea, no vomiting.  Reports being able to drink well today. The symptoms started after dinner. She reports eating half of a steak crunch wrap from [employer] symptoms started shortly after that. She is currently taking protonix 20 mg daily for GERD  No headache, no changes in her vision. No RUQ pain.     OB History    Gravida  2   Para  1   Term  1   Preterm      AB      Living  1     SAB      IAB      Ectopic      Multiple      Live Births  1           Past Medical History:  Diagnosis Date  . Anemia   . Arthritis   . Gestational diabetes   . Hypertension    gestational   . Sjogren's syndrome ([facility]     Past Surgical History:  Procedure Laterality Date  . NO PAST SURGERIES      Family History  Problem Relation Age of Onset  . Hypertension Father   . Arthritis Father   . Obesity Maternal Uncle   . Cancer Maternal Grandmother        brain  . Heart disease Paternal Grandmother   . Hypertension Paternal Grandmother   . Thyroid disease Paternal Grandmother   . Thyroid disease Paternal Grandfather     Social History   Tobacco Use  . Smoking status: Never Smoker  . Smokeless tobacco: Never Used  Vaping Use  . Vaping Use: Never used  Substance Use Topics  . Alcohol use: Not Currently    Comment: rarely  . Drug use: Never    Allergies:  Allergies  Allergen Reactions  . Kiwi Extract Anaphylaxis  . Other Anaphylaxis     Blueberries, blackberries, raspberries, nuts- pt reports that it feels like she is being stabbed in the mouth when eating nuts   . Latex   . Penicillins     Medications Prior to Admission  Medication Sig Dispense Refill Last Dose  . Accu-Chek Softclix Lancets lancets Use as instructed to check blood sugar 4 times daily 100 each 6 07/19/2020 at Unknown time  . acetaminophen (TYLENOL) 500 MG tablet Take 1,000 mg by mouth every 6 (six) hours as needed.   07/18/2020 at Unknown time  . aspirin 81 MG chewable tablet Chew 162 mg by mouth daily.   Past Week at Unknown time  . Blood Glucose Monitoring Suppl (ACCU-CHEK GUIDE ME) w/Device KIT 1 each by Does not apply route 4 (four) times daily. 1 kit 0 07/19/2020 at Unknown time  . Blood Pressure Monitor MISC For regular home bp monitoring during pregnancy 1 each 0 07/19/2020 at Unknown time  . butalbital-acetaminophen-caffeine (FIORICET) 50-325-40 MG tablet Take 1-2 tablets by mouth  every 6 (six) hours as needed for headache. Take 1-2 tablets by mouth every 6 (six) hours as needed for headache.   Past Week at Unknown time  . ferrous sulfate 325 (65 FE) MG tablet Take 1 tablet (325 mg total) by mouth 2 (two) times daily with a meal. 60 tablet 3 07/19/2020 at Unknown time  . glucose blood (ACCU-CHEK GUIDE) test strip Use as instructed to check blood sugar 4 times daily 200 each 2 07/19/2020 at Unknown time  . glyBURIDE (DIABETA) 2.5 MG tablet Take 1 tablet (2.5 mg total) by mouth 2 (two) times daily with a meal. 60 tablet 3 07/19/2020 at Unknown time  . ondansetron (ZOFRAN ODT) 4 MG disintegrating tablet Take 1 tablet (4 mg total) by mouth every 6 (six) hours as needed for nausea. 30 tablet 2 07/19/2020 at Unknown time  . Prenatal Vit-Fe Fumarate-FA (MULTIVITAMIN-PRENATAL) 27-0.8 MG TABS tablet Take 1 tablet by mouth daily at 12 noon.   07/19/2020 at Unknown time  . [DISCONTINUED] pantoprazole (PROTONIX) 20 MG tablet Take 1 tablet (20 mg total) by mouth daily.  30 tablet 6 07/19/2020 at Unknown time   Results for orders placed or performed during the hospital encounter of 07/19/20 (from the past 48 hour(s))  Urinalysis, Routine w reflex microscopic     Status: Abnormal   Collection Time: 07/19/20  9:01 PM  Result Value Ref Range   Color, Urine YELLOW YELLOW   APPearance HAZY (A) CLEAR   Specific Gravity, Urine 1.010 1.005 - 1.030   pH 6.0 5.0 - 8.0   Glucose, UA NEGATIVE NEGATIVE mg/dL   Hgb urine dipstick NEGATIVE NEGATIVE   Bilirubin Urine NEGATIVE NEGATIVE   Ketones, ur 20 (A) NEGATIVE mg/dL   Protein, ur NEGATIVE NEGATIVE mg/dL   Nitrite NEGATIVE NEGATIVE   Leukocytes,Ua MODERATE (A) NEGATIVE   RBC / HPF 0-5 0 - 5 RBC/hpf   WBC, UA 6-10 0 - 5 WBC/hpf   Bacteria, UA RARE (A) NONE SEEN   Squamous Epithelial / LPF 6-10 0 - 5   Mucus PRESENT     Comment: Performed at [facility], [address] [place], [place] [address]  Comprehensive metabolic panel     Status: Abnormal   Collection Time: 07/19/20  9:53 PM  Result Value Ref Range   Sodium 136 135 - 145 mmol/L   Potassium 3.4 (L) 3.5 - 5.1 mmol/L   Chloride 106 98 - 111 mmol/L   CO2 21 (L) 22 - 32 mmol/L   Glucose, Bld 88 70 - 99 mg/dL    Comment: Glucose reference range applies only to samples taken after fasting for at least 8 hours.   BUN <5 (L) 6 - 20 mg/dL   Creatinine, Ser 0.56 0.44 - 1.00 mg/dL   Calcium 8.9 8.9 - 10.3 mg/dL   Total Protein 6.1 (L) 6.5 - 8.1 g/dL   Albumin 2.6 (L) 3.5 - 5.0 g/dL   AST 35 15 - 41 U/L   ALT 62 (H) 0 - 44 U/L   Alkaline Phosphatase 185 (H) 38 - 126 U/L   Total Bilirubin 0.8 0.3 - 1.2 mg/dL   GFR, Estimated >60 >60 mL/min    Comment: (NOTE) Calculated using the CKD-EPI Creatinine Equation (2021)    Anion gap 9 5 - 15    Comment: Performed at [facility], [place] [address], [place], [place] [address]  CBC with Differential/Platelet     Status: Abnormal   Collection Time: 07/19/20  9:53  PM  Result Value Ref Range   WBC 6.5 4.0  - 10.5 K/uL   RBC 4.16 3.87 - 5.11 MIL/uL   Hemoglobin 11.1 (L) 12.0 - 15.0 g/dL   HCT 34.2 (L) 36.0 - 46.0 %   MCV 82.2 80.0 - 100.0 fL   MCH 26.7 26.0 - 34.0 pg   MCHC 32.5 30.0 - 36.0 g/dL   RDW 16.8 (H) 11.5 - 15.5 %   Platelets 243 150 - 400 K/uL   nRBC 0.0 0.0 - 0.2 %   Neutrophils Relative % 70 %   Neutro Abs 4.6 1.7 - 7.7 K/uL   Lymphocytes Relative 18 %   Lymphs Abs 1.2 0.7 - 4.0 K/uL   Monocytes Relative 10 %   Monocytes Absolute 0.7 0.1 - 1.0 K/uL   Eosinophils Relative 1 %   Eosinophils Absolute 0.0 0.0 - 0.5 K/uL   Basophils Relative 0 %   Basophils Absolute 0.0 0.0 - 0.1 K/uL   Immature Granulocytes 1 %   Abs Immature Granulocytes 0.03 0.00 - 0.07 K/uL    Comment: Performed at [facility], [address] [place], [place] [address]  Protein / creatinine ratio, urine     Status: Abnormal   Collection Time: 07/19/20 11:40 PM  Result Value Ref Range   Creatinine, Urine 89.82 mg/dL   Total Protein, Urine 15 mg/dL    Comment: NO NORMAL RANGE ESTABLISHED FOR THIS TEST   Protein Creatinine Ratio 0.17 (H) 0.00 - 0.15 mg/mg[Cre]    Comment: Performed at [facility] [address], [place], [place] [address]   Review of Systems  Constitutional: Negative for fever.  Eyes: Negative for photophobia and visual disturbance.  Cardiovascular: Negative for chest pain.  Gastrointestinal: Positive for abdominal pain and nausea. Negative for diarrhea and vomiting.  Neurological: Negative for headaches.   Physical Exam   Blood pressure (!) 143/81, pulse (!) 113, temperature 98 F (36.7 C), temperature source Oral, resp. rate 20, weight 113 kg, last menstrual period 11/08/2019, SpO2 98 %.   Patient Vitals for the past 24 hrs:  BP Temp Temp src Pulse Resp SpO2 Weight  07/19/20 2324 121/69 -- -- (!) 114 -- -- --  07/19/20 2315 (!) 143/81 98 F (36.7 C) Oral 100 20 -- --  07/19/20 2054 136/68 97.9 F (36.6 C) Oral (!) 113 20 98 % 113 kg    Physical  Exam Constitutional:      Appearance: Normal appearance.  Abdominal:     Tenderness: There is generalized abdominal tenderness. There is no guarding or rebound.  Genitourinary:    Comments: Dilation: Closed Exam by:: Razia Screws Musculoskeletal:     Cervical back: Normal range of motion.  Skin:    General: Skin is warm.  Neurological:     Mental Status: She is alert and oriented to person, place, and time.  Psychiatric:        Mood and Affect: Mood is depressed. Affect is flat and tearful.        Speech: Speech normal.        Behavior: Behavior normal.    Fetal Tracing: Baseline: 140 bpm Variability: Moderate  Accelerations: 15x15 Decelerations: None Toco: quiet   MAU Course  Procedures  MDM  Pepcid and Zofran 8 mg given PO. Patient sitting up and drinking; reporting she feels completely better. Her symptoms have resolved, she reports normal Fetal movement  WBC normal BP normal: mildly elevated ALT (62); unknown cause.  DC BP 143/81: patient  reports she is just tired and wants to go, asked RN to wait a few minutes and repeat BP reading. PCR added onto urine. DC BP 121/69 She has close f/u in the office next week (tues)  Assessment and Plan   1. History of gastroesophageal reflux (GERD)   2. [redacted] weeks gestation of pregnancy   3. Nausea   4. NST (non-stress test) reactive     P:  Discharge home.  Increase protonix to 40 mg daily Avoid greasy, fried foods Return for emergencies only Liver enzymes need re-check, message sent through epic to office Keep your office visit on Tuesday Pre E precautions discussed in detail   Marvel Mcphillips, Artist Pais, NP 07/20/2020 11:31 AM

## 2020-07-20 LAB — PROTEIN / CREATININE RATIO, URINE
Creatinine, Urine: 89.82 mg/dL
Protein Creatinine Ratio: 0.17 mg/mg{Cre} — ABNORMAL HIGH (ref 0.00–0.15)
Total Protein, Urine: 15 mg/dL

## 2020-07-21 ENCOUNTER — Other Ambulatory Visit: Payer: Self-pay | Admitting: Women's Health

## 2020-07-21 DIAGNOSIS — I10 Essential (primary) hypertension: Secondary | ICD-10-CM

## 2020-07-21 DIAGNOSIS — O24419 Gestational diabetes mellitus in pregnancy, unspecified control: Secondary | ICD-10-CM

## 2020-07-21 DIAGNOSIS — O0993 Supervision of high risk pregnancy, unspecified, third trimester: Secondary | ICD-10-CM

## 2020-07-21 LAB — CULTURE, OB URINE

## 2020-07-22 ENCOUNTER — Other Ambulatory Visit: Payer: Self-pay | Admitting: Women's Health

## 2020-07-22 ENCOUNTER — Other Ambulatory Visit: Payer: Self-pay

## 2020-07-22 ENCOUNTER — Other Ambulatory Visit: Payer: Medicaid Other | Admitting: Women's Health

## 2020-07-22 ENCOUNTER — Ambulatory Visit (INDEPENDENT_AMBULATORY_CARE_PROVIDER_SITE_OTHER): Payer: Medicaid Other | Admitting: Women's Health

## 2020-07-22 ENCOUNTER — Other Ambulatory Visit: Payer: Medicaid Other

## 2020-07-22 ENCOUNTER — Ambulatory Visit (INDEPENDENT_AMBULATORY_CARE_PROVIDER_SITE_OTHER): Payer: Medicaid Other

## 2020-07-22 ENCOUNTER — Encounter: Payer: Self-pay | Admitting: Women's Health

## 2020-07-22 ENCOUNTER — Other Ambulatory Visit (HOSPITAL_COMMUNITY)
Admission: RE | Admit: 2020-07-22 | Discharge: 2020-07-22 | Disposition: A | Discharge status: Home / Self Care | Payer: Medicaid Other | Source: Ambulatory Visit | Attending: Obstetrics & Gynecology | Admitting: Obstetrics & Gynecology

## 2020-07-22 VITALS — BP 133/84 | HR 96 | Wt 251.4 lb

## 2020-07-22 DIAGNOSIS — O24419 Gestational diabetes mellitus in pregnancy, unspecified control: Secondary | ICD-10-CM

## 2020-07-22 DIAGNOSIS — O0993 Supervision of high risk pregnancy, unspecified, third trimester: Secondary | ICD-10-CM

## 2020-07-22 DIAGNOSIS — Z1389 Encounter for screening for other disorder: Secondary | ICD-10-CM

## 2020-07-22 DIAGNOSIS — O368130 Decreased fetal movements, third trimester, not applicable or unspecified: Secondary | ICD-10-CM

## 2020-07-22 DIAGNOSIS — Z3A36 36 weeks gestation of pregnancy: Secondary | ICD-10-CM | POA: Diagnosis not present

## 2020-07-22 DIAGNOSIS — O368190 Decreased fetal movements, unspecified trimester, not applicable or unspecified: Secondary | ICD-10-CM | POA: Insufficient documentation

## 2020-07-22 DIAGNOSIS — I10 Essential (primary) hypertension: Secondary | ICD-10-CM

## 2020-07-22 DIAGNOSIS — Z302 Encounter for sterilization: Secondary | ICD-10-CM

## 2020-07-22 DIAGNOSIS — Z331 Pregnant state, incidental: Secondary | ICD-10-CM

## 2020-07-22 DIAGNOSIS — R300 Dysuria: Secondary | ICD-10-CM | POA: Diagnosis not present

## 2020-07-22 LAB — POCT URINALYSIS DIPSTICK OB
Blood, UA: NEGATIVE
Glucose, UA: NEGATIVE
Leukocytes, UA: NEGATIVE
Nitrite, UA: NEGATIVE

## 2020-07-22 NOTE — Progress Notes (Signed)
HIGH-RISK PREGNANCY VISIT Patient name: Melanie Russo MRN [id number]  Date of birth: November 07, 1995 Chief Complaint:   Routine Prenatal Visit  History of Present Illness:   Melanie Russo is a 24 y.o. G35P1001 female at [redacted]w[redacted]d with an Estimated Date of Delivery: 08/14/20 being seen today for ongoing management of a high-risk pregnancy complicated by chronic hypertension currently on no meds, diabetes mellitus A2DM currently on glyburide 2.5mg  BID and persistent decreased fm since 33wks.  Today she reports majority of sugars wnl. Continued decreased fm, doesn't really feel any movement during day (even when sitting still focusing on it), some small movements at night. Prior to 33wks reports baby was very active. Today on u/s when baby was moving, couldn't feel it. Went to MAU on 12/8 and 12/11 for DFM, reactive NST both times. Sometimes burns w/ urinating.  Depression screen [facility] 2/9 07/07/2020 06/20/2020 02/05/2020 12/14/2019  Decreased Interest 0 0 1 2  Down, Depressed, Hopeless 0 1 1 0  PHQ - 2 Score 0 1 2 2   Altered sleeping - 3 3 3   Tired, decreased energy - 3 3 3   Change in appetite - 0 3 1  Feeling bad or failure about yourself  - 0 1 0  Trouble concentrating - 0 1 2  Moving slowly or fidgety/restless - 0 0 0  Suicidal thoughts - 0 0 0  PHQ-9 Score - 7 13 11   Difficult doing work/chores - - - Not difficult at all    Contractions: Irritability. Vag. Bleeding: None.  Movement: (!) Decreased. denies leaking of fluid.  Review of Systems:   Pertinent items are noted in HPI Denies abnormal vaginal discharge w/ itching/odor/irritation, headaches, visual changes, shortness of breath, chest pain, abdominal pain, severe nausea/vomiting, or problems with urination or bowel movements unless otherwise stated above. Pertinent History Reviewed:  Reviewed past medical,surgical, social, obstetrical and family history.  Reviewed problem list, medications and allergies. Physical Assessment:   Vitals:    07/22/20 1103  BP: 133/84  Pulse: 96  Weight: 251 lb 6.4 oz (114 kg)  Body mass index is 41.84 kg/m.           Physical Examination:   General appearance: alert, well appearing, and in no distress  Mental status: alert, oriented to person, place, and time  Skin: warm & dry   Extremities: Edema: None    Cardiovascular: normal heart rate noted  Respiratory: normal respiratory effort, no distress  Abdomen: gravid, soft, non-tender  Pelvic: Cervical exam performed  Dilation: 1.5 Effacement (%): Thick Station: -2  Fetal Status: Fetal Heart Rate (bpm): 119 u/s   Movement: (!) Decreased Presentation: Vertex  Fetal Surveillance Testing today: 36+5 wks,cephalic,BPP 8/8,posterior placenta gr 3,fhr 119 bpm,RI .62,.60,.59=69%,EFW 2854 g 39%  Chaperone:    Results for orders placed or performed in visit on 07/22/20 (from the past 24 hour(s))  POC Urinalysis Dipstick OB   Collection Time: 07/22/20 11:03 AM  Result Value Ref Range   Color, UA     Clarity, UA     Glucose, UA Negative Negative   Bilirubin, UA     Ketones, UA large    Spec Grav, UA     Blood, UA neg    pH, UA     POC,PROTEIN,UA Trace Negative, Trace, Small (1+), Moderate (2+), Large (3+), 4+   Urobilinogen, UA     Nitrite, UA neg    Leukocytes, UA Negative Negative   Appearance     Odor  Assessment & Plan:  High-risk pregnancy: G2P1001 at [redacted]w[redacted]d with an Estimated Date of Delivery: 08/14/20   1) CHTN, stable, no meds  2) A2DM, stable on glyburide 2.5mg  BID, EFW today 39%  3) Persistent DFM> all testing wnl, discussed w/ LHE, plan IOL @ 39wks (per CHTN/A2DM criteria, unless anything on testing becomes abnormal- then deliver)  4) Occ dysuria> send cx  Meds: No orders of the defined types were placed in this encounter.   Labs/procedures today: u/s, gbs, gc/ct, sve  Treatment Plan:  2x/wk testing nst alt w/ bpp/dopp, IOL @ 39wks unless indicated earlier  Reviewed: Preterm labor symptoms and general  obstetric precautions including but not limited to vaginal bleeding, contractions, leaking of fluid and fetal movement were reviewed in detail with the patient.  All questions were answered.   Follow-up: Return for As scheduled Friday nst/nurse visit, then needs bpp/dopp in [place] next mond or tues.   Future Appointments  Date Time Provider Department Center  07/25/2020  9:50 AM [facility] NURSE [facility]  07/25/2020  2:00 PM [facility] ECHO 4 [facility]  07/29/2020  9:50 AM Cheral Marker, CNM [facility]  08/06/2020  9:30 AM [facility] [place] [facility] None  08/06/2020 10:30 AM Arabella Merles, CNM [facility]    Orders Placed This Encounter  Procedures  . Strep Gp B NAA+Rflx  . POC Urinalysis Dipstick OB   Cheral Marker CNM, [facility] 07/22/2020 12:20 PM

## 2020-07-22 NOTE — Patient Instructions (Signed)
Melanie Russo, I greatly value your feedback.  If you receive a survey following your visit with [place] today, we appreciate you taking the time to fill it out.  Thanks, Joellyn Haff, CNM, WHNP-BC  [facility] ([address] [place], [place] [address]) Entrance C, located off of [address] valet parking   Go to [email or web address] to register for FREE online childbirth classes    Call the office [phone]) or go to [facility] if:  You begin to have strong, frequent contractions  Your water breaks.  Sometimes it is a big gush of fluid, sometimes it is just a trickle that keeps getting your panties wet or running down your legs  You have vaginal bleeding.  It is normal to have a small amount of spotting if your cervix was checked.   You don't feel your baby moving like normal.  If you don't, get you something to eat and drink and lay down and focus on feeling your baby move.  You should feel at least 10 movements in 2 hours.  If you don't, you should call the office or go to [facility].   Call the office [phone]) or go to [facility] hospital for these signs of pre-eclampsia:  Severe headache that does not go away with Tylenol  Visual changes- seeing spots, double, blurred vision  Pain under your right breast or upper abdomen that does not go away with Tums or heartburn medicine  Nausea and/or vomiting  Severe swelling in your hands, feet, and face    Home Blood Pressure Monitoring for Patients   Your provider has recommended that you check your blood pressure (BP) at least once a week at home. If you do not have a blood pressure cuff at home, one will be provided for you. Contact your provider if you have not received your monitor within 1 week.   Helpful Tips for Accurate Home Blood Pressure Checks  . Don't smoke, exercise, or drink caffeine 30 minutes before checking your BP . Use the restroom before checking your BP (a full  bladder can raise your pressure) . Relax in a comfortable upright chair . Feet on the ground . Left arm resting comfortably on a flat surface at the level of your heart . Legs uncrossed . Back supported . Sit quietly and don't talk . Place the cuff on your bare arm . Adjust snuggly, so that only two fingertips can fit between your skin and the top of the cuff . Check 2 readings separated by at least one minute . Keep a log of your BP readings . For a visual, please reference this diagram: http://ccnc.care/bpdiagram  Provider Name: [facility]     Phone: [phone]  Zone 1: ALL CLEAR  Continue to monitor your symptoms:  . BP reading is less than 140 (top number) or less than 90 (bottom number)  . No right upper stomach pain . No headaches or seeing spots . No feeling nauseated or throwing up . No swelling in face and hands  Zone 2: CAUTION Call your doctor's office for any of the following:  . BP reading is greater than 140 (top number) or greater than 90 (bottom number)  . Stomach pain under your ribs in the middle or right side . Headaches or seeing spots . Feeling nauseated or throwing up . Swelling in face and hands  Zone 3: EMERGENCY  Seek immediate medical care if you have any of the  following:  . BP reading is greater than160 (top number) or greater than 110 (bottom number) . Severe headaches not improving with Tylenol . Serious difficulty catching your breath . Any worsening symptoms from Zone 2   Braxton Hicks Contractions Contractions of the uterus can occur throughout pregnancy, but they are not always a sign that you are in labor. You may have practice contractions called Braxton Hicks contractions. These false labor contractions are sometimes confused with true labor. What are [place] contractions? Braxton Hicks contractions are tightening movements that occur in the muscles of the uterus before labor. Unlike true labor contractions, these  contractions do not result in opening (dilation) and thinning of the cervix. Toward the end of pregnancy (32-34 weeks), Braxton Hicks contractions can happen more often and may become stronger. These contractions are sometimes difficult to tell apart from true labor because they can be very uncomfortable. You should not feel embarrassed if you go to the hospital with false labor. Sometimes, the only way to tell if you are in true labor is for your health care provider to look for changes in the cervix. The health care provider will do a physical exam and may monitor your contractions. If you are not in true labor, the exam should show that your cervix is not dilating and your water has not broken. If there are no other health problems associated with your pregnancy, it is completely safe for you to be sent home with false labor. You may continue to have Braxton Hicks contractions until you go into true labor. How to tell the difference between true labor and false labor True labor  Contractions last 30-70 seconds.  Contractions become very regular.  Discomfort is usually felt in the top of the uterus, and it spreads to the lower abdomen and low back.  Contractions do not go away with walking.  Contractions usually become more intense and increase in frequency.  The cervix dilates and gets thinner. False labor  Contractions are usually shorter and not as strong as true labor contractions.  Contractions are usually irregular.  Contractions are often felt in the front of the lower abdomen and in the groin.  Contractions may go away when you walk around or change positions while lying down.  Contractions get weaker and are shorter-lasting as time goes on.  The cervix usually does not dilate or become thin. Follow these instructions at home:  1. Take over-the-counter and prescription medicines only as told by your health care provider. 2. Keep up with your usual exercises and follow other  instructions from your health care provider. 3. Eat and drink lightly if you think you are going into labor. 4. If Braxton Hicks contractions are making you uncomfortable: ? Change your position from lying down or resting to walking, or change from walking to resting. ? Sit and rest in a tub of warm water. ? Drink enough fluid to keep your urine pale yellow. Dehydration may cause these contractions. ? Do slow and deep breathing several times an hour. 5. Keep all follow-up prenatal visits as told by your health care provider. This is important. Contact a health care provider if:  You have a fever.  You have continuous pain in your abdomen. Get help right away if:  Your contractions become stronger, more regular, and closer together.  You have fluid leaking or gushing from your vagina.  You pass blood-tinged mucus (bloody show).  You have bleeding from your vagina.  You have low back pain  that you never had before.  You feel your baby's head pushing down and causing pelvic pressure.  Your baby is not moving inside you as much as it used to. Summary  Contractions that occur before labor are called Braxton Hicks contractions, false labor, or practice contractions.  Braxton Hicks contractions are usually shorter, weaker, farther apart, and less regular than true labor contractions. True labor contractions usually become progressively stronger and regular, and they become more frequent.  Manage discomfort from [facility] contractions by changing position, resting in a warm bath, drinking plenty of water, or practicing deep breathing. This information is not intended to replace advice given to you by your health care provider. Make sure you discuss any questions you have with your health care provider. Document Revised: 07/08/2017 Document Reviewed: 12/09/2016 Elsevier Patient Education  [place].

## 2020-07-22 NOTE — Progress Notes (Signed)
[place] 36+5 wks,cephalic,BPP 8/8,posterior placenta gr 3,fhr 119 bpm,RI .62,.60,.59=69%,EFW 2854 g 39%

## 2020-07-23 DIAGNOSIS — F411 Generalized anxiety disorder: Secondary | ICD-10-CM | POA: Diagnosis not present

## 2020-07-23 LAB — CERVICOVAGINAL ANCILLARY ONLY
Chlamydia: NEGATIVE
Comment: NEGATIVE
Comment: NORMAL
Neisseria Gonorrhea: NEGATIVE

## 2020-07-24 ENCOUNTER — Other Ambulatory Visit: Payer: Self-pay | Admitting: *Deleted

## 2020-07-24 DIAGNOSIS — I10 Essential (primary) hypertension: Secondary | ICD-10-CM

## 2020-07-24 DIAGNOSIS — O0993 Supervision of high risk pregnancy, unspecified, third trimester: Secondary | ICD-10-CM

## 2020-07-24 LAB — STREP GP B NAA+RFLX: Strep Gp B NAA+Rflx: NEGATIVE

## 2020-07-24 LAB — URINE CULTURE

## 2020-07-25 ENCOUNTER — Ambulatory Visit (INDEPENDENT_AMBULATORY_CARE_PROVIDER_SITE_OTHER): Payer: Medicaid Other | Admitting: *Deleted

## 2020-07-25 ENCOUNTER — Other Ambulatory Visit: Payer: Self-pay

## 2020-07-25 ENCOUNTER — Ambulatory Visit (HOSPITAL_COMMUNITY): Payer: Medicaid Other | Attending: Internal Medicine

## 2020-07-25 VITALS — BP 122/85 | HR 95 | Wt 252.2 lb

## 2020-07-25 DIAGNOSIS — Z3A37 37 weeks gestation of pregnancy: Secondary | ICD-10-CM

## 2020-07-25 DIAGNOSIS — O0993 Supervision of high risk pregnancy, unspecified, third trimester: Secondary | ICD-10-CM | POA: Diagnosis not present

## 2020-07-25 DIAGNOSIS — O24419 Gestational diabetes mellitus in pregnancy, unspecified control: Secondary | ICD-10-CM

## 2020-07-25 DIAGNOSIS — Z302 Encounter for sterilization: Secondary | ICD-10-CM

## 2020-07-25 DIAGNOSIS — Z331 Pregnant state, incidental: Secondary | ICD-10-CM

## 2020-07-25 DIAGNOSIS — R000 Tachycardia, unspecified: Secondary | ICD-10-CM

## 2020-07-25 DIAGNOSIS — Z1389 Encounter for screening for other disorder: Secondary | ICD-10-CM

## 2020-07-25 DIAGNOSIS — I10 Essential (primary) hypertension: Secondary | ICD-10-CM | POA: Diagnosis not present

## 2020-07-25 LAB — ECHOCARDIOGRAM COMPLETE
Area-P 1/2: 4.06 cm2
S' Lateral: 3 cm
Weight: 4035.2 oz

## 2020-07-25 LAB — POCT URINALYSIS DIPSTICK OB
Blood, UA: NEGATIVE
Glucose, UA: NEGATIVE
Ketones, UA: NEGATIVE
Leukocytes, UA: NEGATIVE
Nitrite, UA: NEGATIVE
POC,PROTEIN,UA: NEGATIVE

## 2020-07-25 NOTE — Progress Notes (Addendum)
   NURSE VISIT- NST  SUBJECTIVE:  Melanie Russo is a 24 y.o. G68P1001 female at [redacted]w[redacted]d, here for a NST for pregnancy complicated by [facility] and A2DM currently on Glyburide.  She reports decreased  fetal movement, contractions: none, vaginal bleeding: none, membranes: intact.   OBJECTIVE:  BP 122/85   Pulse 95   Wt 252 lb 3.2 oz (114.4 kg)   LMP 11/08/2019 (Exact Date)   BMI 41.97 kg/m   Appears well, no apparent distress  Results for orders placed or performed in visit on 07/25/20 (from the past 24 hour(s))  POC Urinalysis Dipstick OB   Collection Time: 07/25/20 10:08 AM  Result Value Ref Range   Color, UA     Clarity, UA     Glucose, UA Negative Negative   Bilirubin, UA     Ketones, UA neg    Spec Grav, UA     Blood, UA neg    pH, UA     POC,PROTEIN,UA Negative Negative, Trace, Small (1+), Moderate (2+), Large (3+), 4+   Urobilinogen, UA     Nitrite, UA neg    Leukocytes, UA Negative Negative   Appearance     Odor      NST: FHR baseline 125 bpm, Variability: moderate, Accelerations:present, Decelerations:  Absent= Cat 1/Reactive Toco: none   ASSESSMENT: G2P1001 at [redacted]w[redacted]d with CHTN and A2DM currently on Glyburide NST reactive  PLAN: EFM strip reviewed by Dr. Despina Hidden   Recommendations: keep next appointment as scheduled    [name]  07/25/2020 10:43 AM Attestation of Attending Supervision of Nursing Visit Encounter: Evaluation and management procedures were performed by the nursing staff under my supervision and collaboration.  I have reviewed the nurse's note and chart, and I agree with the management and plan.  Rockne Coons MD Attending Physician for the Center for [facility] 08/10/2020 7:52 AM

## 2020-07-27 ENCOUNTER — Inpatient Hospital Stay (HOSPITAL_COMMUNITY)
Admission: AD | Admit: 2020-07-27 | Discharge: 2020-07-30 | DRG: 786 | Disposition: A | Discharge status: Home / Self Care | Payer: Medicaid Other | Attending: Obstetrics & Gynecology | Admitting: Obstetrics & Gynecology

## 2020-07-27 ENCOUNTER — Encounter (HOSPITAL_COMMUNITY): Payer: Self-pay | Admitting: Obstetrics & Gynecology

## 2020-07-27 ENCOUNTER — Other Ambulatory Visit: Payer: Self-pay

## 2020-07-27 DIAGNOSIS — O368130 Decreased fetal movements, third trimester, not applicable or unspecified: Principal | ICD-10-CM | POA: Diagnosis present

## 2020-07-27 DIAGNOSIS — F418 Other specified anxiety disorders: Secondary | ICD-10-CM | POA: Diagnosis present

## 2020-07-27 DIAGNOSIS — O24425 Gestational diabetes mellitus in childbirth, controlled by oral hypoglycemic drugs: Secondary | ICD-10-CM | POA: Diagnosis not present

## 2020-07-27 DIAGNOSIS — O1002 Pre-existing essential hypertension complicating childbirth: Secondary | ICD-10-CM | POA: Diagnosis present

## 2020-07-27 DIAGNOSIS — Z3490 Encounter for supervision of normal pregnancy, unspecified, unspecified trimester: Secondary | ICD-10-CM

## 2020-07-27 DIAGNOSIS — R7989 Other specified abnormal findings of blood chemistry: Secondary | ICD-10-CM | POA: Diagnosis present

## 2020-07-27 DIAGNOSIS — Z880 Allergy status to penicillin: Secondary | ICD-10-CM

## 2020-07-27 DIAGNOSIS — I10 Essential (primary) hypertension: Secondary | ICD-10-CM | POA: Diagnosis present

## 2020-07-27 DIAGNOSIS — O458X3 Other premature separation of placenta, third trimester: Secondary | ICD-10-CM | POA: Diagnosis not present

## 2020-07-27 DIAGNOSIS — Z91040 Latex allergy status: Secondary | ICD-10-CM

## 2020-07-27 DIAGNOSIS — O285 Abnormal chromosomal and genetic finding on antenatal screening of mother: Secondary | ICD-10-CM | POA: Diagnosis present

## 2020-07-27 DIAGNOSIS — Z3A37 37 weeks gestation of pregnancy: Secondary | ICD-10-CM

## 2020-07-27 DIAGNOSIS — O368190 Decreased fetal movements, unspecified trimester, not applicable or unspecified: Secondary | ICD-10-CM | POA: Diagnosis present

## 2020-07-27 DIAGNOSIS — Z20822 Contact with and (suspected) exposure to covid-19: Secondary | ICD-10-CM | POA: Diagnosis present

## 2020-07-27 DIAGNOSIS — Z141 Cystic fibrosis carrier: Secondary | ICD-10-CM

## 2020-07-27 DIAGNOSIS — O99214 Obesity complicating childbirth: Secondary | ICD-10-CM | POA: Diagnosis not present

## 2020-07-27 DIAGNOSIS — O24419 Gestational diabetes mellitus in pregnancy, unspecified control: Secondary | ICD-10-CM

## 2020-07-27 DIAGNOSIS — O4593 Premature separation of placenta, unspecified, third trimester: Secondary | ICD-10-CM | POA: Diagnosis present

## 2020-07-27 DIAGNOSIS — Z8659 Personal history of other mental and behavioral disorders: Secondary | ICD-10-CM

## 2020-07-27 DIAGNOSIS — O0993 Supervision of high risk pregnancy, unspecified, third trimester: Secondary | ICD-10-CM

## 2020-07-27 DIAGNOSIS — Z302 Encounter for sterilization: Secondary | ICD-10-CM

## 2020-07-27 DIAGNOSIS — M3500 Sicca syndrome, unspecified: Secondary | ICD-10-CM | POA: Diagnosis present

## 2020-07-27 LAB — COMPREHENSIVE METABOLIC PANEL
ALT: 74 U/L — ABNORMAL HIGH (ref 0–44)
AST: 43 U/L — ABNORMAL HIGH (ref 15–41)
Albumin: 2.6 g/dL — ABNORMAL LOW (ref 3.5–5.0)
Alkaline Phosphatase: 196 U/L — ABNORMAL HIGH (ref 38–126)
Anion gap: 10 (ref 5–15)
BUN: 5 mg/dL — ABNORMAL LOW (ref 6–20)
CO2: 20 mmol/L — ABNORMAL LOW (ref 22–32)
Calcium: 9.1 mg/dL (ref 8.9–10.3)
Chloride: 105 mmol/L (ref 98–111)
Creatinine, Ser: 0.53 mg/dL (ref 0.44–1.00)
GFR, Estimated: >60 mL/min (ref >60)
Glucose, Bld: 102 mg/dL — ABNORMAL HIGH (ref 70–99)
Potassium: 3.9 mmol/L (ref 3.5–5.1)
Sodium: 135 mmol/L (ref 135–145)
Total Bilirubin: 0.7 mg/dL (ref 0.3–1.2)
Total Protein: 6 g/dL — ABNORMAL LOW (ref 6.5–8.1)

## 2020-07-27 LAB — RESP PANEL BY RT-PCR (FLU A&B, COVID) ARPGX2
Influenza A by PCR: NEGATIVE
Influenza B by PCR: NEGATIVE
SARS Coronavirus 2 by RT PCR: NEGATIVE

## 2020-07-27 LAB — CBC
HCT: 34.3 % — ABNORMAL LOW (ref 36.0–46.0)
Hemoglobin: 10.8 g/dL — ABNORMAL LOW (ref 12.0–15.0)
MCH: 25.7 pg — ABNORMAL LOW (ref 26.0–34.0)
MCHC: 31.5 g/dL (ref 30.0–36.0)
MCV: 81.5 fL (ref 80.0–100.0)
Platelets: 269 10*3/uL (ref 150–400)
RBC: 4.21 MIL/uL (ref 3.87–5.11)
RDW: 15.7 % — ABNORMAL HIGH (ref 11.5–15.5)
WBC: 7.3 10*3/uL (ref 4.0–10.5)
nRBC: 0 % (ref 0.0–0.2)

## 2020-07-27 LAB — TYPE AND SCREEN
ABO/RH(D): B POS
Antibody Screen: NEGATIVE

## 2020-07-27 MED ORDER — OXYTOCIN BOLUS FROM INFUSION: 333.0000 mL | Freq: Once | INTRAVENOUS | Status: DC

## 2020-07-27 MED ORDER — FENTANYL CITRATE (PF) 100 MCG/2ML IJ SOLN
50.0000 ug | INTRAMUSCULAR | Status: DC | PRN
  Administered 2020-07-28: 100 ug via INTRAVENOUS
  Filled 2020-07-27: qty 2

## 2020-07-27 MED ORDER — ACETAMINOPHEN 325 MG PO TABS
650.0000 mg | ORAL_TABLET | ORAL | Status: DC | PRN
  Administered 2020-07-28 (×2): 650 mg via ORAL
  Filled 2020-07-27 (×2): qty 2

## 2020-07-27 MED ORDER — ONDANSETRON HCL 4 MG/2ML IJ SOLN
4.0000 mg | Freq: Four times a day (QID) | INTRAMUSCULAR | Status: DC | PRN
  Administered 2020-07-28 (×2): 4 mg via INTRAVENOUS
  Filled 2020-07-27 (×2): qty 2

## 2020-07-27 MED ORDER — MISOPROSTOL 50MCG HALF TABLET
50.0000 ug | ORAL_TABLET | ORAL | Status: DC | PRN
  Administered 2020-07-27 – 2020-07-28 (×2): 50 ug via BUCCAL
  Filled 2020-07-27 (×2): qty 1

## 2020-07-27 MED ORDER — SOD CITRATE-CITRIC ACID 500-334 MG/5ML PO SOLN
30.0000 mL | ORAL | Status: DC | PRN
  Filled 2020-07-27: qty 15

## 2020-07-27 MED ORDER — OXYTOCIN-SODIUM CHLORIDE 30-0.9 UT/500ML-% IV SOLN
2.5000 [IU]/h | INTRAVENOUS | Status: DC
  Administered 2020-07-28: 21:00:00 600 m[IU]/min via INTRAVENOUS

## 2020-07-27 MED ORDER — OXYTOCIN-SODIUM CHLORIDE 30-0.9 UT/500ML-% IV SOLN
1.0000 m[IU]/min | INTRAVENOUS | Status: DC
  Administered 2020-07-28: 02:00:00 2 m[IU]/min via INTRAVENOUS
  Filled 2020-07-27: qty 500

## 2020-07-27 MED ORDER — LACTATED RINGERS IV SOLN: INTRAVENOUS | Status: DC

## 2020-07-27 MED ORDER — OXYCODONE-ACETAMINOPHEN 5-325 MG PO TABS: 2.0000 | ORAL_TABLET | ORAL | Status: DC | PRN

## 2020-07-27 MED ORDER — LIDOCAINE HCL (PF) 1 % IJ SOLN: 30.0000 mL | INTRAMUSCULAR | Status: DC | PRN

## 2020-07-27 MED ORDER — LACTATED RINGERS IV SOLN: 500.0000 mL | INTRAVENOUS | Status: DC | PRN

## 2020-07-27 MED ORDER — TERBUTALINE SULFATE 1 MG/ML IJ SOLN
0.2500 mg | Freq: Once | INTRAMUSCULAR | Status: AC | PRN
  Administered 2020-07-28: 21:00:00 0.25 mg via SUBCUTANEOUS
  Filled 2020-07-27: qty 1

## 2020-07-27 MED ORDER — OXYCODONE-ACETAMINOPHEN 5-325 MG PO TABS: 1.0000 | ORAL_TABLET | ORAL | Status: DC | PRN

## 2020-07-27 NOTE — MAU Provider Note (Signed)
[name] is a 24 y.o. female G2P1001 @ [redacted]w[redacted]d here in MAU with persistent DFM. She reports minimal movement all day today.    Dr. Macon Large recommends admission for delivery given CHTN, A2DM and persistent DFM  Patient notified MSE done   Duane Lope, NP 07/27/2020 7:37 PM

## 2020-07-27 NOTE — MAU Note (Signed)
Pt reports decreased fetal movement since this am, reports only one movement noted today and that was in the lobby here. FHT's 145 upon arrival in triage. Denies bleeding or pain.

## 2020-07-27 NOTE — H&P (Signed)
OBSTETRIC ADMISSION HISTORY AND PHYSICAL  Melanie Russo is a 24 y.o. female G2P1001 with IUP at 29w3dby LMP presenting for persistent DFM (present since 33w) and is admitted for IOL in the setting of cHTN and A2GDM. She reports decreased fetal movement, No LOF, no VB, no blurry vision, headaches or peripheral edema, and RUQ pain.  She plans on breast feeding. She requests salpingectomy for birth control, papers signed 06/20/20.  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 Anemia   . Arthritis   . Gestational diabetes   . Hypertension    gestational   . Sjogren's syndrome ([facility]     Past Surgical History: Past Surgical History:  Procedure Laterality Date  . NO PAST SURGERIES      Obstetrical History: OB History    Gravida  2   Para  1   Term  1   Preterm      AB      Living  1     SAB      IAB      Ectopic      Multiple      Live Births  1           Social History Social History   Socioeconomic History  . Marital status: Married    Spouse name: Not on file  . Number of children: Not on file  . Years of education: Not on file  . Highest education level: Not on file  Occupational History  . Not on file  Tobacco Use  . Smoking status: Never Smoker  . Smokeless tobacco: Never Used  Vaping Use  . Vaping Use: Never used  Substance and Sexual Activity  .  Alcohol use: Not Currently    Comment: rarely  . Drug use: Never  . Sexual activity: Not Currently    Birth control/protection: None    Comment: pt reports it has been over a month - 06/26/2020  Other Topics Concern  . Not on file  Social History Narrative  . Not on file   Social Determinants of Health   Financial Resource Strain: Low Risk   . Difficulty of Paying Living Expenses: Not hard at all  Food Insecurity: No Food Insecurity  . Worried About RCharity fundraiserin the Last Year: Never true  . Ran Out of Food in the Last Year: Never true  Transportation Needs: No Transportation Needs  . Lack of Transportation (Medical): No  . Lack of Transportation (Non-Medical): No  Physical Activity: Insufficiently Active  . Days of Exercise per Week: 1 day  . Minutes of Exercise per Session: 30 min  Stress: Stress Concern Present  . Feeling of Stress : Very much  Social Connections: Moderately Isolated  . Frequency of Communication with Friends and Family: More than three times a week  . Frequency of Social Gatherings with Friends and Family: Never  . Attends Religious Services: Never  . Active Member of Clubs or Organizations: No  . Attends  Club or Organization Meetings: Never  . Marital Status: Married    Family History: Family History  Problem Relation Age of Onset  . Hypertension Father   . Arthritis Father   . Obesity Maternal Uncle   . Cancer Maternal Grandmother        brain  . Heart disease Paternal Grandmother   . Hypertension Paternal Grandmother   . Thyroid disease Paternal Grandmother   . Thyroid disease Paternal Grandfather     Allergies: Allergies  Allergen Reactions  . Kiwi Extract Anaphylaxis  . Other Anaphylaxis    Blueberries, blackberries, raspberries, nuts- pt reports that it feels like she is being stabbed in the mouth when eating nuts   . Latex   . Penicillins     Medications Prior to Admission  Medication Sig Dispense Refill Last Dose  .  aspirin 81 MG chewable tablet Chew 162 mg by mouth daily.   07/26/2020 at Unknown time  . butalbital-acetaminophen-caffeine (FIORICET) 50-325-40 MG tablet Take 1-2 tablets by mouth every 6 (six) hours as needed for headache. Take 1-2 tablets by mouth every 6 (six) hours as needed for headache.   Past Month at Unknown time  . ferrous sulfate 325 (65 FE) MG tablet Take 1 tablet (325 mg total) by mouth 2 (two) times daily with a meal. 60 tablet 3 07/26/2020 at Unknown time  . glyBURIDE (DIABETA) 2.5 MG tablet Take 1 tablet (2.5 mg total) by mouth 2 (two) times daily with a meal. 60 tablet 3 07/27/2020 at Unknown time  . ondansetron (ZOFRAN ODT) 4 MG disintegrating tablet Take 1 tablet (4 mg total) by mouth every 6 (six) hours as needed for nausea. 30 tablet 2 07/26/2020 at Unknown time  . pantoprazole (PROTONIX) 40 MG tablet Take 1 tablet (40 mg total) by mouth daily. 60 tablet 1 07/27/2020 at Unknown time  . Prenatal Vit-Fe Fumarate-FA (MULTIVITAMIN-PRENATAL) 27-0.8 MG TABS tablet Take 1 tablet by mouth daily at 12 noon.   07/27/2020 at Unknown time  . Accu-Chek Softclix Lancets lancets Use as instructed to check blood sugar 4 times daily 100 each 6   . acetaminophen (TYLENOL) 500 MG tablet Take 1,000 mg by mouth every 6 (six) hours as needed.   07/25/2020  . Blood Glucose Monitoring Suppl (ACCU-CHEK GUIDE ME) w/Device KIT 1 each by Does not apply route 4 (four) times daily. 1 kit 0   . Blood Pressure Monitor MISC For regular home bp monitoring during pregnancy 1 each 0   . glucose blood (ACCU-CHEK GUIDE) test strip Use as instructed to check blood sugar 4 times daily 200 each 2      Review of Systems   All systems reviewed and negative except as stated in HPI  Blood pressure 139/78, pulse (!) 109, temperature 98.4 F (36.9 C), temperature source Oral, resp. rate 17, height 5' (1.524 m), weight 113.9 kg, last menstrual period 11/08/2019, SpO2 100 %. General appearance: alert, cooperative and no  distress, somewhat anxious appearing Lungs: normal respiratory effort Heart: regular rate Abdomen: soft, non-tender; gravid Pelvic: as noted below Extremities: no sign of DVT Presentation: cephalic on bedside ultrasound Fetal monitoringBaseline: 135 bpm, Variability: Good {> 6 bpm), Accelerations: Reactive and Decelerations: Absent Uterine activityFrequency: Every 2-3 minutes     Prenatal labs: ABO, Rh: B/Positive/-- (06/29 1058) Antibody: Negative (11/12 0906) Rubella: 1.47 (06/29 1058) RPR: Non Reactive (11/12 0906)  HBsAg: Negative (06/29 1058)  HIV: Non Reactive (11/12 0906)  GBS: --[place] (12/14 1359)  2 hr Glucola failed, A2GDM  Genetic screening CF carrier and congenital disorder glycosylation  Anatomy US normal  Prenatal Transfer Tool  Maternal Diabetes: Yes:  Diabetes Type:  Insulin/Medication controlled Genetic Screening: Abnormal:  Results: Other: Maternal Ultrasounds/Referrals: Normal Fetal Ultrasounds or other Referrals:  None  Maternal Substance Abuse:  No Significant Maternal Medications:  Meds include: Protonix, glyburide Significant Maternal Lab Results: Group B Strep negative  No results found for this or any previous visit (from the past 24 hour(s)).  Patient Active Problem List   Diagnosis Date Noted  . Decreased fetal movement during pregnancy 07/22/2020  . Gestational diabetes mellitus, class A2 06/23/2020  . Chronic hypertension 05/09/2020  . Request for sterilization 05/07/2020  . Low vitamin D level 02/27/2020  . 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 Abnormal chromosomal and genetic finding on antenatal screening mother 02/15/2020  . Encounter for supervision of high risk pregnancy in third trimester, antepartum 02/05/2020  . History of postpartum depression 02/05/2020  . Depression with anxiety 02/05/2020  . Sjogren's syndrome ([place]) 02/05/2020    Assessment/Plan:  Kate Larock is a 24 y.o. G2P1001 at 67w3dhere for IOL in the setting  of persistent DFM, cHTN and A2GDM.  #IOL: Discussed IOL process with patient. Given cervical exam will dose cytotec and place FB. Will plan to transition to pitocin as clinically indicated s/p cervical ripening.  #cHTN: diagnosed prior to current pregnancy. No current medications. Asymptomatic. F/u preeclampsia labs on admission. Will continue to monitor.  #A2GDM: glyburide 2.590mBID in pregnancy; will hold and monitor BGL. q4 BGL in latent labor and q2 BGL in active labor.  #Anxiety/depression/history post partum depression: not on meds. Current therapy every 2 weeks. SW consult pp.  #Sjogrens syndrome: no medications in current pregnancy.  #H/o vacuum-assisted vaginal delivery with shoulder dystocia: fetal weight 3487g _0  in the setting of gHTN. Nursing aware.  #Pain: PRN, desires epidural. #FWB: Category 1 strip #ID: GBS neg #MOF: breast #MOC: BTL, papers signed 06/20/20. #Circ: no  GoRanda NgoMD OB Fellow, Faculty Practice 07/27/2020 10:45 PM

## 2020-07-28 ENCOUNTER — Encounter (HOSPITAL_COMMUNITY): Payer: Self-pay | Admitting: Obstetrics & Gynecology

## 2020-07-28 ENCOUNTER — Inpatient Hospital Stay (HOSPITAL_COMMUNITY): Payer: Medicaid Other | Admitting: Anesthesiology

## 2020-07-28 ENCOUNTER — Encounter (HOSPITAL_COMMUNITY)
Admission: AD | Disposition: A | Discharge status: Home / Self Care | Payer: Self-pay | Source: Home / Self Care | Attending: Obstetrics & Gynecology

## 2020-07-28 DIAGNOSIS — O164 Unspecified maternal hypertension, complicating childbirth: Secondary | ICD-10-CM | POA: Diagnosis not present

## 2020-07-28 DIAGNOSIS — Z3A00 Weeks of gestation of pregnancy not specified: Secondary | ICD-10-CM | POA: Diagnosis not present

## 2020-07-28 DIAGNOSIS — O24425 Gestational diabetes mellitus in childbirth, controlled by oral hypoglycemic drugs: Secondary | ICD-10-CM | POA: Diagnosis not present

## 2020-07-28 LAB — CBC
HCT: 32 % — ABNORMAL LOW (ref 36.0–46.0)
Hemoglobin: 10.5 g/dL — ABNORMAL LOW (ref 12.0–15.0)
MCH: 26.6 pg (ref 26.0–34.0)
MCHC: 32.8 g/dL (ref 30.0–36.0)
MCV: 81.2 fL (ref 80.0–100.0)
Platelets: 251 10*3/uL (ref 150–400)
RBC: 3.94 MIL/uL (ref 3.87–5.11)
RDW: 15.9 % — ABNORMAL HIGH (ref 11.5–15.5)
WBC: 9.3 10*3/uL (ref 4.0–10.5)
nRBC: 0 % (ref 0.0–0.2)

## 2020-07-28 LAB — CBC WITH DIFFERENTIAL/PLATELET
Abs Immature Granulocytes: 0.03 10*3/uL (ref 0.00–0.07)
Basophils Absolute: 0 10*3/uL (ref 0.0–0.1)
Basophils Relative: 0 %
Eosinophils Absolute: 0 10*3/uL (ref 0.0–0.5)
Eosinophils Relative: 0 %
HCT: 33.6 % — ABNORMAL LOW (ref 36.0–46.0)
Hemoglobin: 10.7 g/dL — ABNORMAL LOW (ref 12.0–15.0)
Immature Granulocytes: 0 %
Lymphocytes Relative: 18 %
Lymphs Abs: 1.5 10*3/uL (ref 0.7–4.0)
MCH: 26 pg (ref 26.0–34.0)
MCHC: 31.8 g/dL (ref 30.0–36.0)
MCV: 81.6 fL (ref 80.0–100.0)
Monocytes Absolute: 0.6 10*3/uL (ref 0.1–1.0)
Monocytes Relative: 7 %
Neutro Abs: 6.2 10*3/uL (ref 1.7–7.7)
Neutrophils Relative %: 75 %
Platelets: 255 10*3/uL (ref 150–400)
RBC: 4.12 MIL/uL (ref 3.87–5.11)
RDW: 15.9 % — ABNORMAL HIGH (ref 11.5–15.5)
WBC: 8.4 10*3/uL (ref 4.0–10.5)
nRBC: 0 % (ref 0.0–0.2)

## 2020-07-28 LAB — COMPREHENSIVE METABOLIC PANEL
ALT: 70 U/L — ABNORMAL HIGH (ref 0–44)
AST: 41 U/L (ref 15–41)
Albumin: 2.4 g/dL — ABNORMAL LOW (ref 3.5–5.0)
Alkaline Phosphatase: 201 U/L — ABNORMAL HIGH (ref 38–126)
Anion gap: 11 (ref 5–15)
BUN: <5 mg/dL — ABNORMAL LOW (ref 6–20)
CO2: 20 mmol/L — ABNORMAL LOW (ref 22–32)
Calcium: 8.7 mg/dL — ABNORMAL LOW (ref 8.9–10.3)
Chloride: 103 mmol/L (ref 98–111)
Creatinine, Ser: 0.62 mg/dL (ref 0.44–1.00)
GFR, Estimated: >60 mL/min (ref >60)
Glucose, Bld: 77 mg/dL (ref 70–99)
Potassium: 3.8 mmol/L (ref 3.5–5.1)
Sodium: 134 mmol/L — ABNORMAL LOW (ref 135–145)
Total Bilirubin: 1.4 mg/dL — ABNORMAL HIGH (ref 0.3–1.2)
Total Protein: 5.8 g/dL — ABNORMAL LOW (ref 6.5–8.1)

## 2020-07-28 LAB — RPR: RPR Ser Ql: NONREACTIVE

## 2020-07-28 LAB — GLUCOSE, CAPILLARY
Glucose-Capillary: 100 mg/dL — ABNORMAL HIGH (ref 70–99)
Glucose-Capillary: 121 mg/dL — ABNORMAL HIGH (ref 70–99)
Glucose-Capillary: 72 mg/dL (ref 70–99)
Glucose-Capillary: 82 mg/dL (ref 70–99)
Glucose-Capillary: 86 mg/dL (ref 70–99)
Glucose-Capillary: 88 mg/dL (ref 70–99)
Glucose-Capillary: 94 mg/dL (ref 70–99)
Glucose-Capillary: 97 mg/dL (ref 70–99)

## 2020-07-28 LAB — PROTEIN / CREATININE RATIO, URINE
Creatinine, Urine: 123.53 mg/dL
Protein Creatinine Ratio: 1.03 mg/mg{Cre} — ABNORMAL HIGH (ref 0.00–0.15)
Total Protein, Urine: 127 mg/dL

## 2020-07-28 SURGERY — Surgical Case
Anesthesia: Epidural

## 2020-07-28 MED ORDER — FENTANYL CITRATE (PF) 100 MCG/2ML IJ SOLN: 25.0000 ug | INTRAMUSCULAR | Status: DC | PRN

## 2020-07-28 MED ORDER — ONDANSETRON HCL 4 MG/2ML IJ SOLN
INTRAMUSCULAR | Status: DC | PRN
  Administered 2020-07-28: 4 mg via INTRAVENOUS

## 2020-07-28 MED ORDER — LACTATED RINGERS IV SOLN: INTRAVENOUS | Status: DC

## 2020-07-28 MED ORDER — METOCLOPRAMIDE HCL 5 MG/ML IJ SOLN
INTRAMUSCULAR | Status: DC | PRN
  Administered 2020-07-28: 10 mg via INTRAVENOUS

## 2020-07-28 MED ORDER — FENTANYL CITRATE (PF) 100 MCG/2ML IJ SOLN
INTRAMUSCULAR | Status: DC | PRN
  Administered 2020-07-28 (×2): 50 ug via INTRAVENOUS

## 2020-07-28 MED ORDER — SCOPOLAMINE 1 MG/3DAYS TD PT72
MEDICATED_PATCH | TRANSDERMAL | Status: DC | PRN
  Administered 2020-07-28: 1 via TRANSDERMAL

## 2020-07-28 MED ORDER — OXYCODONE HCL 5 MG PO TABS
5.0000 mg | ORAL_TABLET | ORAL | Status: DC | PRN
  Administered 2020-07-30 (×2): 10 mg via ORAL
  Filled 2020-07-28 (×2): qty 2

## 2020-07-28 MED ORDER — DIPHENHYDRAMINE HCL 50 MG/ML IJ SOLN: 12.5000 mg | INTRAMUSCULAR | Status: DC | PRN

## 2020-07-28 MED ORDER — PHENYLEPHRINE HCL-NACL 20-0.9 MG/250ML-% IV SOLN
INTRAVENOUS | Status: DC | PRN
  Administered 2020-07-28: 60 ug/min via INTRAVENOUS

## 2020-07-28 MED ORDER — IBUPROFEN 800 MG PO TABS
800.0000 mg | ORAL_TABLET | Freq: Four times a day (QID) | ORAL | Status: DC
  Administered 2020-07-30 (×3): 800 mg via ORAL
  Filled 2020-07-28 (×3): qty 1

## 2020-07-28 MED ORDER — AMISULPRIDE (ANTIEMETIC) 5 MG/2ML IV SOLN: 10.0000 mg | Freq: Once | INTRAVENOUS | Status: DC | PRN

## 2020-07-28 MED ORDER — SODIUM CHLORIDE 0.9% FLUSH: 3.0000 mL | INTRAVENOUS | Status: DC | PRN

## 2020-07-28 MED ORDER — PHENYLEPHRINE HCL (PRESSORS) 10 MG/ML IV SOLN
INTRAVENOUS | Status: DC | PRN
  Administered 2020-07-28: 80 ug via INTRAVENOUS
  Administered 2020-07-28: 160 ug via INTRAVENOUS
  Administered 2020-07-28 (×2): 80 ug via INTRAVENOUS

## 2020-07-28 MED ORDER — LIDOCAINE-EPINEPHRINE 2 %-1:100000 IJ SOLN
INTRAMUSCULAR | Status: DC | PRN
  Administered 2020-07-28 (×4): 5 mL via INTRADERMAL

## 2020-07-28 MED ORDER — NALOXONE HCL 4 MG/10ML IJ SOLN
1.0000 ug/kg/h | INTRAVENOUS | Status: DC | PRN
  Filled 2020-07-28: qty 5

## 2020-07-28 MED ORDER — DEXMEDETOMIDINE HCL 200 MCG/2ML IV SOLN
INTRAVENOUS | Status: DC | PRN
  Administered 2020-07-28: 20 ug via INTRAVENOUS

## 2020-07-28 MED ORDER — NALOXONE HCL 0.4 MG/ML IJ SOLN: 0.4000 mg | INTRAMUSCULAR | Status: DC | PRN

## 2020-07-28 MED ORDER — NALBUPHINE HCL 10 MG/ML IJ SOLN: 5.0000 mg | INTRAMUSCULAR | Status: DC | PRN

## 2020-07-28 MED ORDER — CEFAZOLIN SODIUM-DEXTROSE 2-3 GM-%(50ML) IV SOLR
INTRAVENOUS | Status: DC | PRN
  Administered 2020-07-28: 2 g via INTRAVENOUS

## 2020-07-28 MED ORDER — SIMETHICONE 80 MG PO CHEW: 80.0000 mg | CHEWABLE_TABLET | ORAL | Status: DC | PRN

## 2020-07-28 MED ORDER — SCOPOLAMINE 1 MG/3DAYS TD PT72: 1.0000 | MEDICATED_PATCH | Freq: Once | TRANSDERMAL | Status: DC

## 2020-07-28 MED ORDER — MORPHINE SULFATE (PF) 0.5 MG/ML IJ SOLN
INTRAMUSCULAR | Status: DC | PRN
  Administered 2020-07-28: 3 mg via EPIDURAL

## 2020-07-28 MED ORDER — LACTATED RINGERS IV SOLN: INTRAVENOUS | Status: DC | PRN

## 2020-07-28 MED ORDER — OXYTOCIN-SODIUM CHLORIDE 30-0.9 UT/500ML-% IV SOLN
INTRAVENOUS | Status: AC
  Filled 2020-07-28: qty 500

## 2020-07-28 MED ORDER — MENTHOL 3 MG MT LOZG: 1.0000 | LOZENGE | OROMUCOSAL | Status: DC | PRN

## 2020-07-28 MED ORDER — KETOROLAC TROMETHAMINE 30 MG/ML IJ SOLN
30.0000 mg | Freq: Four times a day (QID) | INTRAMUSCULAR | Status: AC
  Administered 2020-07-29 (×3): 30 mg via INTRAVENOUS
  Filled 2020-07-28 (×3): qty 1

## 2020-07-28 MED ORDER — PROMETHAZINE HCL 25 MG/ML IJ SOLN: 6.2500 mg | INTRAMUSCULAR | Status: DC | PRN

## 2020-07-28 MED ORDER — NALBUPHINE HCL 10 MG/ML IJ SOLN: 5.0000 mg | Freq: Once | INTRAMUSCULAR | Status: DC | PRN

## 2020-07-28 MED ORDER — WITCH HAZEL-GLYCERIN EX PADS: 1.0000 "application " | MEDICATED_PAD | CUTANEOUS | Status: DC | PRN

## 2020-07-28 MED ORDER — TRANEXAMIC ACID-NACL 1000-0.7 MG/100ML-% IV SOLN
INTRAVENOUS | Status: DC | PRN
  Administered 2020-07-28 (×2): 1000 mg via INTRAVENOUS

## 2020-07-28 MED ORDER — OXYTOCIN-SODIUM CHLORIDE 30-0.9 UT/500ML-% IV SOLN: 2.5000 [IU]/h | INTRAVENOUS | Status: AC

## 2020-07-28 MED ORDER — TRANEXAMIC ACID-NACL 1000-0.7 MG/100ML-% IV SOLN
INTRAVENOUS | Status: AC
  Filled 2020-07-28: qty 100

## 2020-07-28 MED ORDER — COCONUT OIL OIL: 1.0000 "application " | TOPICAL_OIL | Status: DC | PRN

## 2020-07-28 MED ORDER — SODIUM CHLORIDE (PF) 0.9 % IJ SOLN
INTRAMUSCULAR | Status: DC | PRN
  Administered 2020-07-28: 12 mL/h via EPIDURAL

## 2020-07-28 MED ORDER — MISOPROSTOL 100 MCG PO TABS
100.0000 ug | ORAL_TABLET | ORAL | Status: AC
  Filled 2020-07-28 (×2): qty 1

## 2020-07-28 MED ORDER — TETANUS-DIPHTH-ACELL PERTUSSIS 5-2.5-18.5 LF-MCG/0.5 IM SUSY: 0.5000 mL | PREFILLED_SYRINGE | Freq: Once | INTRAMUSCULAR | Status: DC

## 2020-07-28 MED ORDER — METHYLERGONOVINE MALEATE 0.2 MG/ML IJ SOLN
INTRAMUSCULAR | Status: DC | PRN
  Administered 2020-07-28: .2 mg via INTRAMUSCULAR

## 2020-07-28 MED ORDER — FENTANYL CITRATE (PF) 100 MCG/2ML IJ SOLN
INTRAMUSCULAR | Status: DC | PRN
  Administered 2020-07-28: 100 ug via EPIDURAL

## 2020-07-28 MED ORDER — MEPERIDINE HCL 25 MG/ML IJ SOLN: 6.2500 mg | INTRAMUSCULAR | Status: DC | PRN

## 2020-07-28 MED ORDER — DIPHENHYDRAMINE HCL 25 MG PO CAPS: 25.0000 mg | ORAL_CAPSULE | Freq: Four times a day (QID) | ORAL | Status: DC | PRN

## 2020-07-28 MED ORDER — KETOROLAC TROMETHAMINE 30 MG/ML IJ SOLN: 30.0000 mg | Freq: Four times a day (QID) | INTRAMUSCULAR | Status: AC | PRN

## 2020-07-28 MED ORDER — EPHEDRINE 5 MG/ML INJ: 10.0000 mg | INTRAVENOUS | Status: DC | PRN

## 2020-07-28 MED ORDER — ONDANSETRON HCL 4 MG/2ML IJ SOLN: 4.0000 mg | Freq: Three times a day (TID) | INTRAMUSCULAR | Status: DC | PRN

## 2020-07-28 MED ORDER — SCOPOLAMINE 1 MG/3DAYS TD PT72
MEDICATED_PATCH | TRANSDERMAL | Status: AC
  Filled 2020-07-28: qty 1

## 2020-07-28 MED ORDER — ENOXAPARIN SODIUM 40 MG/0.4ML ~~LOC~~ SOLN: 40.0000 mg | SUBCUTANEOUS | Status: DC

## 2020-07-28 MED ORDER — FENTANYL-BUPIVACAINE-NACL 0.5-0.125-0.9 MG/250ML-% EP SOLN
12.0000 mL/h | EPIDURAL | Status: DC | PRN
  Administered 2020-07-28: 18:00:00 12 mL/h via EPIDURAL
  Filled 2020-07-28 (×2): qty 250

## 2020-07-28 MED ORDER — OXYTOCIN-SODIUM CHLORIDE 30-0.9 UT/500ML-% IV SOLN: INTRAVENOUS | Status: DC | PRN

## 2020-07-28 MED ORDER — KETOROLAC TROMETHAMINE 30 MG/ML IJ SOLN
INTRAMUSCULAR | Status: AC
  Filled 2020-07-28: qty 1

## 2020-07-28 MED ORDER — DEXMEDETOMIDINE (PRECEDEX) IN NS 20 MCG/5ML (4 MCG/ML) IV SYRINGE
PREFILLED_SYRINGE | INTRAVENOUS | Status: AC
  Filled 2020-07-28: qty 5

## 2020-07-28 MED ORDER — PHENYLEPHRINE HCL-NACL 20-0.9 MG/250ML-% IV SOLN
INTRAVENOUS | Status: AC
  Filled 2020-07-28: qty 250

## 2020-07-28 MED ORDER — PHENYLEPHRINE 40 MCG/ML (10ML) SYRINGE FOR IV PUSH (FOR BLOOD PRESSURE SUPPORT): 80.0000 ug | PREFILLED_SYRINGE | INTRAVENOUS | Status: DC | PRN

## 2020-07-28 MED ORDER — ACETAMINOPHEN 500 MG PO TABS: 1000.0000 mg | ORAL_TABLET | Freq: Three times a day (TID) | ORAL | Status: DC

## 2020-07-28 MED ORDER — SODIUM CHLORIDE 0.9 % IV SOLN: INTRAVENOUS | Status: DC | PRN

## 2020-07-28 MED ORDER — ZOLPIDEM TARTRATE 5 MG PO TABS
5.0000 mg | ORAL_TABLET | Freq: Every evening | ORAL | Status: DC | PRN
Start: 2020-07-28 — End: 2020-07-30

## 2020-07-28 MED ORDER — DIPHENHYDRAMINE HCL 25 MG PO CAPS: 25.0000 mg | ORAL_CAPSULE | ORAL | Status: DC | PRN

## 2020-07-28 MED ORDER — SENNOSIDES-DOCUSATE SODIUM 8.6-50 MG PO TABS
2.0000 | ORAL_TABLET | ORAL | Status: DC
  Administered 2020-07-29 – 2020-07-30 (×2): 2 via ORAL
  Filled 2020-07-28 (×2): qty 2

## 2020-07-28 MED ORDER — LACTATED RINGERS IV SOLN
500.0000 mL | Freq: Once | INTRAVENOUS | Status: AC
  Administered 2020-07-28: 02:00:00 500 mL via INTRAVENOUS

## 2020-07-28 MED ORDER — LIDOCAINE HCL (PF) 1 % IJ SOLN
INTRAMUSCULAR | Status: DC | PRN
  Administered 2020-07-28 (×2): 5 mL via EPIDURAL

## 2020-07-28 MED ORDER — FENTANYL CITRATE (PF) 100 MCG/2ML IJ SOLN
INTRAMUSCULAR | Status: AC
  Filled 2020-07-28: qty 2

## 2020-07-28 MED ORDER — PRENATAL MULTIVITAMIN CH
1.0000 | ORAL_TABLET | Freq: Every day | ORAL | Status: DC
  Administered 2020-07-29 – 2020-07-30 (×2): 1 via ORAL
  Filled 2020-07-28 (×2): qty 1

## 2020-07-28 MED ORDER — STERILE WATER FOR IRRIGATION IR SOLN
Status: DC | PRN
  Administered 2020-07-28: 1000 mL

## 2020-07-28 MED ORDER — DIBUCAINE (PERIANAL) 1 % EX OINT: 1.0000 "application " | TOPICAL_OINTMENT | CUTANEOUS | Status: DC | PRN

## 2020-07-28 MED ORDER — PROPOFOL 10 MG/ML IV BOLUS
INTRAVENOUS | Status: AC
  Filled 2020-07-28: qty 20

## 2020-07-28 MED ORDER — ACETAMINOPHEN 500 MG PO TABS
1000.0000 mg | ORAL_TABLET | Freq: Four times a day (QID) | ORAL | Status: AC
  Administered 2020-07-29 – 2020-07-30 (×4): 1000 mg via ORAL
  Filled 2020-07-28 (×4): qty 2

## 2020-07-28 MED ORDER — SODIUM CHLORIDE 0.9 % IR SOLN
Status: DC | PRN
  Administered 2020-07-28: 1000 mL

## 2020-07-28 MED ORDER — METOCLOPRAMIDE HCL 5 MG/ML IJ SOLN
INTRAMUSCULAR | Status: AC
  Filled 2020-07-28: qty 2

## 2020-07-28 MED ORDER — KETOROLAC TROMETHAMINE 30 MG/ML IJ SOLN
30.0000 mg | Freq: Four times a day (QID) | INTRAMUSCULAR | Status: AC | PRN
  Administered 2020-07-28: 30 mg via INTRAVENOUS

## 2020-07-28 MED ORDER — SIMETHICONE 80 MG PO CHEW
80.0000 mg | CHEWABLE_TABLET | Freq: Three times a day (TID) | ORAL | Status: DC
  Administered 2020-07-29 – 2020-07-30 (×3): 80 mg via ORAL
  Filled 2020-07-28 (×3): qty 1

## 2020-07-28 MED ORDER — MORPHINE SULFATE (PF) 0.5 MG/ML IJ SOLN
INTRAMUSCULAR | Status: AC
  Filled 2020-07-28: qty 10

## 2020-07-28 SURGICAL SUPPLY — 42 items
ADH SKN CLS APL DERMABOND .7 (GAUZE/BANDAGES/DRESSINGS) ×2
APL SKNCLS STERI-STRIP NONHPOA (GAUZE/BANDAGES/DRESSINGS) ×1
BENZOIN TINCTURE PRP APPL 2/3 (GAUZE/BANDAGES/DRESSINGS) ×3 IMPLANT
CHLORAPREP W/TINT 26ML (MISCELLANEOUS) ×3 IMPLANT
CLAMP CORD UMBIL (MISCELLANEOUS) IMPLANT
CLOSURE WOUND 1/2 X4 (GAUZE/BANDAGES/DRESSINGS) ×1
CLOTH BEACON ORANGE TIMEOUT ST (SAFETY) ×3 IMPLANT
DERMABOND ADVANCED (GAUZE/BANDAGES/DRESSINGS) ×4
DERMABOND ADVANCED .7 DNX12 (GAUZE/BANDAGES/DRESSINGS) ×2 IMPLANT
DRSG OPSITE POSTOP 4X10 (GAUZE/BANDAGES/DRESSINGS) ×3 IMPLANT
ELECT REM PT RETURN 9FT ADLT (ELECTROSURGICAL) ×3
ELECTRODE REM PT RTRN 9FT ADLT (ELECTROSURGICAL) ×1 IMPLANT
EXTRACTOR VACUUM BELL STYLE (SUCTIONS) IMPLANT
GLOVE BIOGEL PI IND STRL 7.0 (GLOVE) ×1 IMPLANT
GLOVE BIOGEL PI IND STRL 8 (GLOVE) ×1 IMPLANT
GLOVE BIOGEL PI INDICATOR 7.0 (GLOVE) ×2
GLOVE BIOGEL PI INDICATOR 8 (GLOVE) ×2
GLOVE ECLIPSE 8.0 STRL XLNG CF (GLOVE) ×3 IMPLANT
GOWN STRL REUS W/TWL LRG LVL3 (GOWN DISPOSABLE) ×6 IMPLANT
KIT ABG SYR 3ML LUER SLIP (SYRINGE) ×3 IMPLANT
NEEDLE HYPO 18GX1.5 BLUNT FILL (NEEDLE) ×3 IMPLANT
NEEDLE HYPO 22GX1.5 SAFETY (NEEDLE) ×3 IMPLANT
NEEDLE HYPO 25X5/8 SAFETYGLIDE (NEEDLE) ×6 IMPLANT
NS IRRIG 1000ML POUR BTL (IV SOLUTION) ×3 IMPLANT
PACK C SECTION WH (CUSTOM PROCEDURE TRAY) ×3 IMPLANT
PAD OB MATERNITY 4.3X12.25 (PERSONAL CARE ITEMS) ×3 IMPLANT
PENCIL SMOKE EVAC W/HOLSTER (ELECTROSURGICAL) ×3 IMPLANT
RTRCTR C-SECT PINK 25CM LRG (MISCELLANEOUS) IMPLANT
STRIP CLOSURE SKIN 1/2X4 (GAUZE/BANDAGES/DRESSINGS) ×2 IMPLANT
SUT CHROMIC 0 CT 1 (SUTURE) ×3 IMPLANT
SUT MNCRL 0 VIOLET CTX 36 (SUTURE) ×4 IMPLANT
SUT MONOCRYL 0 CTX 36 (SUTURE) ×8
SUT PLAIN 2 0 (SUTURE)
SUT PLAIN 2 0 XLH (SUTURE) IMPLANT
SUT PLAIN ABS 2-0 CT1 27XMFL (SUTURE) IMPLANT
SUT VIC AB 0 CTX 36 (SUTURE) ×3
SUT VIC AB 0 CTX36XBRD ANBCTRL (SUTURE) ×1 IMPLANT
SUT VIC AB 4-0 KS 27 (SUTURE) IMPLANT
SYR 20CC LL (SYRINGE) ×6 IMPLANT
TOWEL OR 17X24 6PK STRL BLUE (TOWEL DISPOSABLE) ×3 IMPLANT
TRAY FOLEY W/BAG SLVR 14FR LF (SET/KITS/TRAYS/PACK) IMPLANT
WATER STERILE IRR 1000ML POUR (IV SOLUTION) ×3 IMPLANT

## 2020-07-28 NOTE — Progress Notes (Addendum)
Melanie Russo is a 24 y.o. G2P1001 at [redacted]w[redacted]d by LMP admitted for induction of labor due to DFM, cHTN, A2GDM, and recently diagnosed SIPE without SF, UP:C 1.03 on admission.   Subjective: Patient denies feeling any contractions at this time. Patient content with epidural and reports positive satisfaction with pain control/relief. Pt denies headache, blurred vision, or right upper quadrant pain.   Objective: BP 127/74   Pulse 76   Temp 97.8 F (36.6 C) (Oral)   Resp 18   Ht 5' (1.524 m)   Wt 113.9 kg   LMP 11/08/2019 (Exact Date)   SpO2 98%   BMI 49.02 kg/m  No intake/output data recorded. Total I/O In: -  Out: 150 [Urine:150]  FHT:  FHR: 135 bpm, variability: moderate,  accelerations:  Present,  decelerations:  Absent UC:   regular, every 2-5 minutes SVE:   Dilation: 4 Effacement (%): 60 Station: Ballotable Exam by:: Bayess, Ardyth Gal, CNM  Labs: Lab Results  Component Value Date   WBC 8.4 07/28/2020   HGB 10.7 (L) 07/28/2020   HCT 33.6 (L) 07/28/2020   MCV 81.6 07/28/2020   PLT 255 07/28/2020    Assessment / Plan: Induction of labor due to preeclampsia, gestational diabetes and DFM -Repeat SVE and restart Pitocin at 1100   Labor: Progressing normally Preeclampsia:  SIPE without SF upon admission UP:C 1.03. Asymptomatic. No current medications. Will continue to monitor blood pressure.   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 BGL currently at goal will continue to monitor- latent: q 4 hours, active: q 2hours Fetal Wellbeing:  Category I Pain Control:  Epidural I/D:  GBS negative Anticipated MOD:  NSVD  Sande Rives 07/28/2020, 9:02 AM

## 2020-07-28 NOTE — Progress Notes (Addendum)
Melanie Russo is a 24 y.o. G2P1001 at [redacted]w[redacted]d by LMP admitted for induction of labor due to DFM, cHTN, A2GDM, and recently diagnosed SIPE without SF, UP:C 1.03 on admission.  Subjective: Patient reports break through contractions that are resolved with PCA for epidural pump. Patient is satisfied with pain control/relief at this time. Pt denies headache, blurred vision, or right upper quadrant pain.    Objective: BP (!) 122/52    Pulse 93    Temp 98 F (36.7 C) (Oral)    Resp 17    Ht 5' (1.524 m)    Wt 113.9 kg    LMP 11/08/2019 (Exact Date)    SpO2 98%    BMI 49.02 kg/m  No intake/output data recorded. Total I/O In: -  Out: 400 [Urine:400]  FHT:  FHR: 140 bpm, variability: moderate,  accelerations:  Present,  decelerations:  Absent UC:   regular, every 1-3 minutes SVE:   Dilation: 4 Effacement (%): 80 Station: -2 Exam by:: Janeice Robinson, RN  Labs: Lab Results  Component Value Date   WBC 8.4 07/28/2020   HGB 10.7 (L) 07/28/2020   HCT 33.6 (L) 07/28/2020   MCV 81.6 07/28/2020   PLT 255 07/28/2020    Assessment / Plan: Induction of labor due to preeclampsia, gestational diabetes and DFM,  progressing well on pitocin  Labor: AROM, will continue to increase Pitocin Preeclampsia:  SIPE without SF; Asymptomatic. No current medications. Will continue to monitor blood pressure.  Fetal Wellbeing:  Category I Pain Control:  Epidural I/D:  GBS negative Anticipated MOD:  NSVD  Sande Rives 07/28/2020, 2:49 PM

## 2020-07-28 NOTE — Progress Notes (Addendum)
Melanie Russo is a 24 y.o. G2P1001 at [redacted]w[redacted]d presented for IOL due to persistent DFM, cHTN, A2GDM, and recently diagnosed SIPE without SF, UP:C 1.03 on admission.  Subjective: Pt now resting comfortably with epidural in place; she reports relief from contractions, states still able to feel contractions and has pain with each one but is getting better with each one.  Pt continues to deny HA, blurred vision, or epigastric pain.   Objective: BP (!) 143/68   Pulse 81   Temp 98 F (36.7 C) (Oral)   Resp 18   Ht 5' (1.524 m)   Wt 113.9 kg   LMP 11/08/2019 (Exact Date)   SpO2 98%   BMI 49.02 kg/m  No intake/output data recorded. No intake/output data recorded.  FHT:  FHR: 130 bpm, variability: moderate,  accelerations:  Present,  decelerations:  Absent UC:   regular, every 1-3 minutes SVE:   Dilation: 4 Effacement (%): 70 Station: Ballotable Exam by:: [employer], RN  Labs: Lab Results  Component Value Date   WBC 8.4 07/28/2020   HGB 10.7 (L) 07/28/2020   HCT 33.6 (L) 07/28/2020   MCV 81.6 07/28/2020   PLT 255 07/28/2020    Assessment / Plan: Induction of labor due to preeclampsia, gestational diabetes and DFM,  progressing well on pitocin  #Labor: Cytotec x1, FB placement with expulsion at 4 cm, now low dose pitocin  #cHTN: SIPE without SF upon admission UP:C 1.03. Asymptomatic.  No current medications.  Will continue to monitor Bps  #A2GDM: glyburide 2.5 mg BID in pregnancy, now held. BGL currently at goal will continue to monitor- latent: q 4 hours, active: q 2hours  #Anxiety/depression: not medicated- therapy q 2 weeks  #Sjogrens syndrome: no medication use in pregnancy  #Hx vacuum-assisted vaginal delivery with shoulder dystocia: fetal weight 3487 grams at 38.2 weeks with gHTN  #Fetal Wellbeing:  Category I #Pain Control:  Epidural #I/D:   GBS neg   #MOF: Breast #MOC: BTL, papers sign 06/20/20  #Anticipated MOD:  NSVD  Mechele Claude, SNM 07/28/2020,  3:41 AM

## 2020-07-28 NOTE — Discharge Summary (Signed)
Postpartum Discharge Summary       Patient Name: Melanie Russo DOB: [birth year] MRN: [id number]  Date of admission: 07/27/2020 Delivery date:07/28/2020  Delivering provider: Florian Buff  Date of discharge: 07/30/2020  Admitting diagnosis: Decreased fetal movement [O36.8190] Intrauterine pregnancy: [redacted]w[redacted]d    Secondary diagnosis:  Active Problems:   Encounter for supervision of high risk pregnancy in third trimester, antepartum   History of postpartum depression   Depression with anxiety   S[place]ren's syndrome [place]   Cystic fibrosis carrier   Abnormal chromosomal and genetic finding on antenatal screening mother   Low vitamin D level   Request for sterilization   Chronic hypertension   Gestational diabetes mellitus, class A2   Decreased fetal movement during pregnancy   Encounter for induction of labor   Decreased fetal movement  Additional problems: none    Discharge diagnosis: Term Pregnancy Delivered, CHTN with superimposed preeclampsia and GDM A2                                              Post partum procedures:none Augmentation: AROM, Pitocin, Cytotec and IP Foley Complications: None  Hospital course: Induction of Labor With Cesarean Section   24y.o. yo G2P2002 at 341w4das admitted to the hospital 07/27/2020 for induction of labor. Patient had a labor course significant for progressing to 5 cm. Fetal heart rate dropped to 70's despite interventions. The patient went for cesarean section due to Non-Reassuring FHR. Delivery details are as follows: Membrane Rupture Time/Date: 4:08 PM ,07/28/2020   Delivery Method:C-Section, Low Transverse  Details of operation can be found in separate operative Note.  Patient had an uncomplicated postpartum course. Her blood pressures remained normotensive and did not require medication. She is ambulating, tolerating a regular diet, passing flatus, and urinating well.  Patient is discharged home in stable condition on 07/30/20.       Newborn Data: Birth date:07/28/2020  Birth time:8:59 PM  Gender:Female  Living status:Living  Apgars:8 ,9  Weight:2780 g                                 Magnesium Sulfate received: No BMZ received: No Rhophylac:N/A MMR:N/A T-DaP:Given prenatally Flu: No Transfusion:No  Physical exam  Vitals:   07/29/20 1230 07/29/20 1648 07/29/20 2203 07/30/20 0500  BP: 112/62 121/68 120/70 (!) 111/51  Pulse: 75 97 71 92  Resp: '16 17 18 17  ' Temp: 98.5 F (36.9 C) 98.4 F (36.9 C) 98.1 F (36.7 C) 97.7 F (36.5 C)  TempSrc: Oral Oral Oral Oral  SpO2: 97% 97% 99% 99%  Weight:      Height:       General: alert, cooperative and no distress Lochia: appropriate Uterine Fundus: firm Incision: Healing well with no significant drainage DVT Evaluation: No evidence of DVT seen on physical exam. Labs: Lab Results  Component Value Date   WBC 11.0 (H) 07/29/2020   HGB 8.2 (L) 07/29/2020   HCT 26.0 (L) 07/29/2020   MCV 85.0 07/29/2020   PLT 192 07/29/2020   CMP Latest Ref Rng & Units 07/29/2020  Glucose 70 - 99 mg/dL -  BUN 6 - 20 mg/dL -  Creatinine 0.44 - 1.00 mg/dL 0.92  Sodium 135 - 145 mmol/L -  Potassium 3.5 - 5.1 mmol/L -  Chloride  98 - 111 mmol/L -  CO2 22 - 32 mmol/L -  Calcium 8.9 - 10.3 mg/dL -  Total Protein 6.5 - 8.1 g/dL -  Total Bilirubin 0.3 - 1.2 mg/dL -  Alkaline Phos 38 - 126 U/L -  AST 15 - 41 U/L -  ALT 0 - 44 U/L -   Edinburgh Score: Edinburgh Postnatal Depression Scale Screening Tool 07/29/2020  I have been able to laugh and see the funny side of things. 0  I have looked forward with enjoyment to things. 0  I have blamed myself unnecessarily when things went wrong. 2  I have been anxious or worried for no good reason. 3  I have felt scared or panicky for no good reason. 2  Things have been getting on top of me. 1  I have been so unhappy that I have had difficulty sleeping. 1  I have felt sad or miserable. 0  I have been so unhappy that I have been  crying. 0  The thought of harming myself has occurred to me. 0  Edinburgh Postnatal Depression Scale Total 9     After visit meds:  Allergies as of 07/30/2020      Reactions   Kiwi Extract Anaphylaxis   Other Anaphylaxis   Blueberries, blackberries, raspberries, nuts- pt reports that it feels like she is being stabbed in the mouth when eating nuts    Latex    Penicillins       Medication List    STOP taking these medications   Accu-Chek Guide Me w/Device Kit   Accu-Chek Guide test strip Generic drug: glucose blood   Accu-Chek Softclix Lancets lancets   aspirin 81 MG chewable tablet   Blood Pressure Monitor Misc   glyBURIDE 2.5 MG tablet Commonly known as: DIABETA   ondansetron 4 MG disintegrating tablet Commonly known as: Zofran ODT   pantoprazole 40 MG tablet Commonly known as: Protonix     TAKE these medications   acetaminophen 500 MG tablet Commonly known as: TYLENOL Take 1,000 mg by mouth every 6 (six) hours as needed. What changed: Another medication with the same name was added. Make sure you understand how and when to take each.   acetaminophen 325 MG tablet Commonly known as: Tylenol Take 2 tablets (650 mg total) by mouth every 4 (four) hours as needed. What changed: You were already taking a medication with the same name, and this prescription was added. Make sure you understand how and when to take each.   butalbital-acetaminophen-caffeine 50-325-40 MG tablet Commonly known as: FIORICET Take 1-2 tablets by mouth every 6 (six) hours as needed for headache. Take 1-2 tablets by mouth every 6 (six) hours as needed for headache.   ferrous sulfate 325 (65 FE) MG tablet Take 1 tablet (325 mg total) by mouth every other day. What changed: when to take this   ibuprofen 800 MG tablet Commonly known as: ADVIL Take 1 tablet (800 mg total) by mouth every 6 (six) hours.   multivitamin-prenatal 27-0.8 MG Tabs tablet Take 1 tablet by mouth daily at 12 noon.    oxyCODONE 5 MG immediate release tablet Commonly known as: Oxy IR/ROXICODONE Take 1 tablet (5 mg total) by mouth every 4 (four) hours as needed for severe pain.            Discharge Care Instructions  (From admission, onward)         Start     Ordered   07/30/20 0000  Discharge wound care:  Comments: Remove dressing on fifth day after surgery   07/30/20 0734           Discharge home in stable condition Infant Feeding: No evidence of DVT seen on physical exam. Infant Disposition:home with mother Discharge instruction: per After Visit Summary and Postpartum booklet. Activity: Advance as tolerated. Pelvic rest for 6 weeks.  Diet: routine diet Future Appointments: Future Appointments  Date Time Provider [place]  08/05/2020 10:10 AM Roma Schanz, CNM [facility]  09/02/2020  1:50 PM Roma Schanz, CNM [facility]   Follow up Visit:   Please schedule this patient for a In person postpartum visit in 6 weeks with the following provider: MD. Additional Postpartum F/U:Postpartum Depression checkup in one week, 2 hour GTT at six weeks, Incision check 1 week and BP check 1 week  High risk pregnancy complicated by: GDM, chronic HTN, depression/anxiety  Delivery mode:  C-Section, Low Transverse  Anticipated Birth Control:  Unsure   07/30/2020 Janet Berlin, MD

## 2020-07-28 NOTE — Progress Notes (Signed)
Labor Progress Note Melanie Russo is a 24 y.o. G2P1001 at [redacted]w[redacted]d presented for IOL in the setting of persistent DFM, cHTN and A2GDM.  S: Pt reporting increasing discomfort with contractions. She also reports bloody show upon using the bathroom. No visible clots or ongoing bleeding. Notified pt of new diagnosis of SIPE without SF given UP:C 1.03 on admission. No headache, vision changes or other concerns at this time.  O:  BP (!) 142/78   Pulse 88   Temp 98 F (36.7 C) (Oral)   Resp 20   Ht 5' (1.524 m)   Wt 113.9 kg   LMP 11/08/2019 (Exact Date)   SpO2 100%   BMI 49.02 kg/m  EFM: baseline 130/moderate variability/+accels, no decels Toco: ctx q2-4 min  CVE: Dilation: 3 Effacement (%): 70 Station: Ballotable Presentation: Vertex Exam by:: Medford Staheli,MD  Assessment/Plan:  Melanie Russo is a 24 y.o. G2P1001 at [redacted]w[redacted]d here for IOL in the setting of persistent DFM, cHTN and A2GDM.  #IOL: S/p cytotec x1 and FB placement. Will transition to pitocin given FB now out.  #cHTN with SIPE without SF: diagnosed prior to current pregnancy. No current medications. Asymptomatic. Preeclampsia labs reassuring but new preeclampsia diagnosis given newly elevated UP:C 1.03 on admission. Will continue to monitor Bps. Pt aware.  #A2GDM: glyburide 2.5mg  BID in pregnancy; will hold and monitor BGL. BG levels currently at goal. Will continue to monitor q4 BGL in latent labor and q2 BGL in active labor.   #Anxiety/depression/history post partum depression: not on meds. Current therapy every 2 weeks. SW consult pp.  #Sjogrens syndrome: no medications in current pregnancy.  #H/o vacuum-assisted vaginal delivery with shoulder dystocia: fetal weight 3487g @[redacted]w[redacted]d  in the setting of gHTN. Nursing aware.  #Pain: PRN, desires epidural. #FWB: Category 1 strip #ID: GBS neg #MOF: breast #MOC: BTL, papers signed 06/20/20. #Circ: no  13/12/21, MD OB Fellow, [facility] 07/28/2020 2:07 AM

## 2020-07-28 NOTE — Anesthesia Procedure Notes (Signed)
Epidural Patient location during procedure: OB Start time: 07/28/2020 3:09 AM End time: 07/28/2020 3:19 AM  Staffing Anesthesiologist: Leonides Grills, MD Performed: anesthesiologist   Preanesthetic Checklist Completed: patient identified, IV checked, site marked, risks and benefits discussed, monitors and equipment checked, pre-op evaluation and timeout performed  Epidural Patient position: sitting Prep: DuraPrep Patient monitoring: heart rate, cardiac monitor, continuous pulse ox and blood pressure Approach: midline Location: L4-L5 Injection technique: LOR air  Needle:  Needle type: Tuohy  Needle gauge: 17 G Needle length: 9 cm Needle insertion depth: 9 cm Catheter type: closed end flexible Catheter size: 19 Gauge Catheter at skin depth: 15 cm Test dose: negative and Other  Assessment Events: blood not aspirated, injection not painful, no injection resistance and negative IV test  Additional Notes Informed consent obtained prior to proceeding including risk of failure, 1% risk of PDPH, risk of minor discomfort and bruising. Discussed alternatives to epidural analgesia and patient desires to proceed.  Timeout performed pre-procedure verifying patient name, procedure, and platelet count.  Patient tolerated procedure well. Reason for block:procedure for pain

## 2020-07-28 NOTE — Anesthesia Preprocedure Evaluation (Signed)
Anesthesia Evaluation  Patient identified by MRN, date of birth, ID band Patient awake    Reviewed: Allergy & Precautions, H&P , NPO status , Patient's Chart, lab work & pertinent test results  History of Anesthesia Complications Negative for: history of anesthetic complications  Airway Mallampati: II  TM Distance: >3 FB Neck ROM: full    Dental no notable dental hx. (+) Teeth Intact   Pulmonary neg pulmonary ROS,    Pulmonary exam normal breath sounds clear to auscultation       Cardiovascular hypertension, Normal cardiovascular exam Rhythm:regular Rate:Normal     Neuro/Psych negative neurological ROS  negative psych ROS   GI/Hepatic negative GI ROS, Neg liver ROS,   Endo/Other  diabetes, Oral Hypoglycemic AgentsMorbid obesity  Renal/GU negative Renal ROS  negative genitourinary   Musculoskeletal   Abdominal (+) + obese,   Peds  Hematology  (+) Blood dyscrasia, anemia ,   Anesthesia Other Findings   Reproductive/Obstetrics (+) Pregnancy                             Anesthesia Physical Anesthesia Plan  ASA: III  Anesthesia Plan: Epidural   Post-op Pain Management:    Induction:   PONV Risk Score and Plan:   Airway Management Planned:   Additional Equipment:   Intra-op Plan:   Post-operative Plan:   Informed Consent: I have reviewed the patients History and Physical, chart, labs and discussed the procedure including the risks, benefits and alternatives for the proposed anesthesia with the patient or authorized representative who has indicated his/her understanding and acceptance.       Plan Discussed with:   Anesthesia Plan Comments:         Anesthesia Quick Evaluation

## 2020-07-28 NOTE — Progress Notes (Addendum)
Melanie Russo is a 24 y.o. G2P1001 at [redacted]w[redacted]d presented for IOL due to persistent DFM, cHTN, A2GDM, and recently diagnosed SIPE without SF, UP:C 1.03 on admission.  Subjective: Pt denies feeling contractions at this time; has been resting well since epidural placement. Discussed plan of care for AROM and cervical exam--pt agreeable to this plan.  Pt reporting mild HA, denies need for intervention- denies visual changes or epigastric pain.   Objective: BP (!) 132/51   Pulse 78   Temp (!) 97.3 F (36.3 C) (Oral)   Resp 16   Ht 5' (1.524 m)   Wt 113.9 kg   LMP 11/08/2019 (Exact Date)   SpO2 98%   BMI 49.02 kg/m  No intake/output data recorded. No intake/output data recorded.  FHT:  FHR: 130 bpm, variability: moderate,  accelerations:  Present,  decelerations:  Absent UC:   irregular, every 1-4 minutes SVE:   Dilation: 4 Effacement (%): 60 Station: Ballotable Exam by:: Bayess, [place], CNM  Labs: Lab Results  Component Value Date   WBC 8.4 07/28/2020   HGB 10.7 (L) 07/28/2020   HCT 33.6 (L) 07/28/2020   MCV 81.6 07/28/2020   PLT 255 07/28/2020    Assessment / Plan: 24 y.o. G2P1001 at [redacted]w[redacted]d presented for IOL due to persistent DFM, cHTN, A2GDM, and recently diagnosed SIPE without SF, UP:C 1.03 on admission.   #Labor: No change in cervical exam since last cervical check. S/p  cytotec x1, FB placement with expulsion at 4 cm, low dose pitocin- off at 0647. Will re-dose cytotec given minimal effacement.   #cHTN: SIPE without SF upon admission UP:C 1.03. Asymptomatic.  No current medications. Will continue to monitor Bps   #A2GDM: glyburide 2.5 mg BID in pregnancy, now held. BGL currently at goal; will continue to monitor- latent: q 4 hours, active: q 2hours   #Anxiety/depression: not medicated- therapy q 2 weeks   #Sjogrens syndrome: no medication use in pregnancy   #Hx vacuum-assisted vaginal delivery with shoulder dystocia: fetal weight 3487 grams at 38.2 weeks with  gHTN.   #Fetal Wellbeing:  Category I #Pain Control:  Epidural #I/D:  GBS neg  #MOF: Breast #MOC: BTL, papers sign 06/20/20   #Anticipated MOD:  NSVD  Jeneveive L Bayess 07/28/2020, 7:00 AM

## 2020-07-28 NOTE — Transfer of Care (Signed)
Immediate Anesthesia Transfer of Care Note  Patient: Melanie Russo  Procedure(s) Performed: CESAREAN SECTION (N/A )  Patient Location: PACU  Anesthesia Type:Epidural  Level of Consciousness: awake, alert , oriented and patient cooperative  Airway & Oxygen Therapy: Patient Spontanous Breathing  Post-op Assessment: Report given to RN and Post -op Vital signs reviewed and stable  Post vital signs: Reviewed and stable  Last Vitals:  Vitals Value Taken Time  BP 149/91 07/28/20 2200  Temp 37.6 C 07/28/20 2200  Pulse 130 07/28/20 2208  Resp 25 07/28/20 2208  SpO2 91 % 07/28/20 2208  Vitals shown include unvalidated device data.  Last Pain:  Vitals:   07/28/20 2020  TempSrc:   PainSc: 4          Complications: No complications documented.

## 2020-07-28 NOTE — Plan of Care (Signed)
Baby will be assigned to [facility] upon delivery Dr. Pricilla Holm.

## 2020-07-29 ENCOUNTER — Encounter (HOSPITAL_COMMUNITY): Payer: Self-pay | Admitting: Obstetrics & Gynecology

## 2020-07-29 ENCOUNTER — Other Ambulatory Visit: Payer: Medicaid Other | Admitting: Women's Health

## 2020-07-29 DIAGNOSIS — Z3A37 37 weeks gestation of pregnancy: Secondary | ICD-10-CM | POA: Diagnosis not present

## 2020-07-29 DIAGNOSIS — O458X3 Other premature separation of placenta, third trimester: Secondary | ICD-10-CM | POA: Diagnosis not present

## 2020-07-29 LAB — CBC
HCT: 26 % — ABNORMAL LOW (ref 36.0–46.0)
HCT: 27.5 % — ABNORMAL LOW (ref 36.0–46.0)
Hemoglobin: 8.2 g/dL — ABNORMAL LOW (ref 12.0–15.0)
Hemoglobin: 8.9 g/dL — ABNORMAL LOW (ref 12.0–15.0)
MCH: 26.6 pg (ref 26.0–34.0)
MCH: 26.8 pg (ref 26.0–34.0)
MCHC: 31.5 g/dL (ref 30.0–36.0)
MCHC: 32.4 g/dL (ref 30.0–36.0)
MCV: 82.3 fL (ref 80.0–100.0)
MCV: 85 fL (ref 80.0–100.0)
Platelets: 192 10*3/uL (ref 150–400)
Platelets: 204 10*3/uL (ref 150–400)
RBC: 3.06 MIL/uL — ABNORMAL LOW (ref 3.87–5.11)
RBC: 3.34 MIL/uL — ABNORMAL LOW (ref 3.87–5.11)
RDW: 15.8 % — ABNORMAL HIGH (ref 11.5–15.5)
RDW: 16.3 % — ABNORMAL HIGH (ref 11.5–15.5)
WBC: 11 10*3/uL — ABNORMAL HIGH (ref 4.0–10.5)
WBC: 18 10*3/uL — ABNORMAL HIGH (ref 4.0–10.5)
nRBC: 0 % (ref 0.0–0.2)
nRBC: 0 % (ref 0.0–0.2)

## 2020-07-29 LAB — GLUCOSE, CAPILLARY: Glucose-Capillary: 118 mg/dL — ABNORMAL HIGH (ref 70–99)

## 2020-07-29 LAB — CREATININE, SERUM
Creatinine, Ser: 0.92 mg/dL (ref 0.44–1.00)
GFR, Estimated: >60 mL/min (ref >60)

## 2020-07-29 MED ORDER — ENOXAPARIN SODIUM 60 MG/0.6ML ~~LOC~~ SOLN
60.0000 mg | SUBCUTANEOUS | Status: DC
  Administered 2020-07-29 – 2020-07-30 (×2): 60 mg via SUBCUTANEOUS
  Filled 2020-07-29 (×2): qty 0.6

## 2020-07-29 NOTE — Lactation Note (Signed)
This note was copied from a baby's chart. Lactation Consultation Note  Patient Name: Melanie Russo [id number] Date: 07/29/2020 Reason for consult: Follow-up assessment;Early term 24-38.6wks  P2 mother whose infant is now 40 hours old.  This is an ETI at 37+4 weeks.  Mother breast fed her first child (now 25 years old) for 6 weeks.  Mother requested latch assistance.    When I arrived, baby was asleep and not showing feeding cues.  Suck training performed due to baby biting and chomping with an uncoordinated suck.  Asked mother to practice hand expression while I worked with baby.  She was able to express one drop of colostrum which I finger fed back to baby.    Mother's breasts are soft and non tender and nipples are small, short shafted and compressible.  Mother has breast shells at the bedside but has not started using them.  Reviewed use and suggested she begin using them.  Assisted baby to latch to the left breast in the football hold, however, he was not interested in sucking.  Breast compressions and gentle stimulation completed.  RN in room and, per drs. orders, will initiate supplementation due to lower blood sugar levels.  Mother chose donor breast milk and form signed.  Taught paced bottle feeding and observed baby feeding with the extra slow flow nipple.  He consumed 15 mls of donor milk easily.  Burped well.  Initiated the DEBP with mother and reviewed set up and cleaning.  Left mother pumping and feeding plan established.  Mother will call her RN/LC for assistance as needed.  Mother has a manual pump and a DEBP for home use.  Father present.  RN in room to administer pain medications to mother and updated.   Maternal Data Formula Feeding for Exclusion: No Has patient been taught Hand Expression?: Yes Does the patient have breastfeeding experience prior to this delivery?: Yes  Feeding Feeding Type: Donor Breast Milk Nipple Type: Extra Slow Flow  LATCH Score Latch: Repeated  attempts needed to sustain latch, nipple held in mouth throughout feeding, stimulation needed to elicit sucking reflex.  Audible Swallowing: None  Type of Nipple: Flat  Comfort (Breast/Nipple): Soft / non-tender  Hold (Positioning): Assistance needed to correctly position infant at breast and maintain latch.  LATCH Score: 5  Interventions    Lactation Tools Discussed/Used Pump Review: Setup, frequency, and cleaning;Milk Storage (Reviewed) Initiated by:: Laureen Ochs Date initiated:: 07/29/20   Consult Status Consult Status: Follow-up Date: 07/30/20 Follow-up type: In-patient    Dora Sims 07/29/2020, 12:32 PM

## 2020-07-29 NOTE — Progress Notes (Addendum)
Post Partum Day 1 Subjective: Melanie Russo is a 24 y.o. G2P2002 at [redacted]w[redacted]d by LMP admitted for induction of labor due to DFM, cHTN, A2GDM, and diagnosed SIPE without SF on admission, UP:C 1.03.  Pt reports pain is well controlled with scheduled medication, getting up and out of bed without issue.  Pt verbalizes acceptance with stat cesarean and understanding of what happen.  Happy both mom and baby are safe- no issues.  Objective: Blood pressure 138/84, pulse (!) 111, temperature 98.3 F (36.8 C), temperature source Oral, resp. rate 17, height 5' (1.524 m), weight 113.9 kg, last menstrual period 11/08/2019, SpO2 96 %, unknown if currently breastfeeding.  Physical Exam:  General: alert and cooperative Lochia: appropriate Uterine Fundus: firm Incision: healing well, no significant drainage, no dehiscence DVT Evaluation: No evidence of DVT seen on physical exam. No cords or calf tenderness.  Recent Labs    07/28/20 1546 07/28/20 2332  HGB 10.5* 8.9*  HCT 32.0* 27.5*    Assessment/Plan: POD#1 Preeclampsia:  SIPE without SF upon admission UP:C 1.03. Asymptomatic. No current medications. Will continue to monitor blood pressure.   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 Fasting BGL this morning 118. Plan for GTT at 6wks PP Pain Control:  Controlled on scheduled medication I/D:  GBS negative    LOS: 2 days   Melanie Russo 07/29/2020, 7:53 AM   Attestation of Supervision of Student:  I confirm that I have verified the information documented in the nurse midwife student's note and that I have also personally reperformed the history, physical exam and all medical decision making activities.  I have verified that all services and findings are accurately documented in this student's note; and I agree with management and plan as outlined in the documentation. I have also made any necessary editorial changes.  -Continue to monitor BPs -Still awaiting POD#1 CBC -Anticipate  d/c 07/30/20 if doing well -Plan for interval salpingectomy  Melanie Russo, CNM [facility] 07/29/2020 11:30 AM

## 2020-07-29 NOTE — Op Note (Signed)
Preoperative diagnosis: 1.  Intrauterine pregnancy at 37 weeks and 4 days gestation                                         2.  Fetal bradycardic episode, nonresponsive to intrauterine resuscitation  Postoperative diagnosis: Same as above plus occult placental abruption, approximately 25%  Procedure: Emergency cesarean section, low transverse  Surgeon:Everard Interrante Lauretta Chester, MD  Anesthesia: Epidural dosed up  Findings: The fetal heart rate dropped abruptly from 140s down to the 70s where it remained over the ensuing 12 minutes. There was no improvement except for very momentary during this entire time There was no bleeding noted I called for emergency cesarean section At the time of surgery she had a large amount of blood in the uterus and approximately a 25% placental abruption  The infant was a viable female delivered at 2059 with Apgars of 8 and 9 with a arterial cord pH of 7.15  The uterus tubes and ovaries were otherwise normal  Description of operation: Patient was taken emergently to the operating room The dosing of her epidural had begun in the labor room By the time I arrived she had an adequate level Patient was prepped and draped in usual sterile fashion Pfannenstiel skin incision was made carried down sharply to the rectus fascia which was scored in midline extended laterally The fascia was taken off the muscle superiorly and inferiorly without difficulty The muscles were divided the peritoneal cavity was entered Transverse incision in the lower uterine segment was made and over this incision was delivered a viable female at 2059 with Apgars of 8 and 9 There was an  occult placental abruption diagnosed at this time The infant was handed to the neonatal resuscitation team who was present The placenta was delivered spontaneously and sent to pathology for evaluation The uterus was closed in 2 layers The first being a running interlocking layer and the second being an imbricating  layer There was good hemostasis Patient was given TXA prophylactically because of the abruption The muscles and peritoneum were reapproximated loosely The fascia was closed using 0 Vicryl running The subcutaneous tissue was made hemostatic and irrigated The skin was closed using 4-0 Vicryl on a Keith needle in a subcuticular fashion Dermabond was placed Honeycomb dressing was placed  Patient tolerated the procedure well  Blood loss for the procedure was 800 cc  She received Ancef and Zithromax  She was taken recovery in good stable condition all counts correct x3   Lazaro Arms, MD 07/29/2020 11:12 AM

## 2020-07-29 NOTE — Progress Notes (Addendum)
CSW received consult for hx of PPD, Anxiety and Depression.  CSW met with MOB to offer support and complete assessment.    CSW congratulated MOB on the birth of infant. CSW advised MOB of the HPPA policy in which MOB was agreeable to have FOB leave room. CSW then advised MOB of CSW's role and the reason for CSW coming to speak with her. MOB expressed that she see's a therapist via telehealth for her anxiety and depression. MOB expressed that she was diagnosed with anxiety and depression in 2019. MOB reported that she had health concern as well as was caring for her grandmother who was very ill. MOB expressed that since being in therapy her anxiety and depression has been well managed. MOB indicated that she has no desire to take medications as "they make me more anxious". MOB reported that she has a hx of PPD with her 86 years old son. MOB expressed that she had feelings around herself but never the child. MOB reported that she has no other mental health hx and denies SI, HI as well as DV to this CSW.   CSW inquired from [facility] on who her supports are during this time in which MOB expressed that she has support from her spouse as well as her grandmother. MOB expressed that they have all needed items to care for infant with plans for infant to sleep in basinet once arrived home. MOB reported to CSW that infant would be [facility] at [facility] for further care. MOB reported no other needs to CSW at this time.   CSW provided education regarding the baby blues period vs. perinatal mood disorders, discussed treatment and gave resources for mental health follow up if concerns arise.  CSW recommends self-evaluation during the postpartum time period using the New Mom Checklist from Postpartum Progress and encouraged MOB to contact a medical professional if symptoms are noted at any time.   CSW provided review of Sudden Infant Death Syndrome (SIDS) precautions.   CSW identifies no further need for intervention and no  barriers to discharge at this time.   Melanie Russo, MSW, LCSW [facility] [place] [phone]

## 2020-07-29 NOTE — Anesthesia Postprocedure Evaluation (Signed)
Anesthesia Post Note  Patient: Melanie Russo  Procedure(s) Performed: CESAREAN SECTION (N/A )     Patient location during evaluation: Mother Baby Anesthesia Type: Epidural Level of consciousness: oriented and awake and alert Pain management: pain level controlled Vital Signs Assessment: post-procedure vital signs reviewed and stable Respiratory status: spontaneous breathing and respiratory function stable Cardiovascular status: blood pressure returned to baseline and stable Postop Assessment: no headache, no backache, no apparent nausea or vomiting and patient able to bend at knees Anesthetic complications: no   No complications documented.  Last Vitals:  Vitals:   07/28/20 2300 07/28/20 2303  BP: (!) 115/102   Pulse: (!) 126 (!) 120  Resp: (!) 22 (!) 21  Temp:  37.5 C  SpO2: 96% 96%    Last Pain:  Vitals:   07/28/20 2303  TempSrc: Oral  PainSc:                  Mellody Dance

## 2020-07-29 NOTE — Anesthesia Postprocedure Evaluation (Signed)
Anesthesia Post Note  Patient: Melanie Russo  Procedure(s) Performed: CESAREAN SECTION (N/A )     Patient location during evaluation: Mother Baby Anesthesia Type: Epidural Level of consciousness: awake and alert Pain management: pain level controlled Vital Signs Assessment: post-procedure vital signs reviewed and stable Respiratory status: spontaneous breathing, nonlabored ventilation and respiratory function stable Cardiovascular status: stable Postop Assessment: no headache, no backache and epidural receding Anesthetic complications: no   No complications documented.  Last Vitals:  Vitals:   07/29/20 0346 07/29/20 0500  BP:    Pulse:    Resp: 17   Temp:  36.8 C  SpO2: 97% 96%    Last Pain:  Vitals:   07/29/20 0500  TempSrc: Oral  PainSc:    Pain Goal:                   Shamela Haydon

## 2020-07-29 NOTE — Lactation Note (Signed)
This note was copied from a baby's chart. Lactation Consultation Note Baby 6 hrs old at time of consult. RN informed LC that baby's glucose in 30's  Baby will not latch. RN took #24 NS. To large for baby's mouth. Baby has recessed chin keeps bottom lip sucked into mouth. LC fitted #16 NS since no 20's available. Baby has no interest in BF. Wouldn't latch. LC attempted stimulation w/gloved finger, baby wouldn't suck, just bite.  Mom has been holding baby STS. LC hand expressed 2 ml colostrum spoon fed baby colostrum. He took it well. Baby very sleepy.  Mom shown how to use DEBP & how to disassemble, clean, & reassemble parts. Mom knows to pump q3h for 15-20 min. Mom encouraged to feed baby 8-12 times/24 hours and with feeding cues.  Newborn behavior and feeding habits reviewed.  Mom has flat compressible nipples. Positioned baby in football position while attempting to latch.  Encouraged to call for assistance or questions. Lactation brochure given.  Patient Name: Melanie Russo [id number] Date: 07/29/2020 Reason for consult: Follow-up assessment;Difficult latch;Early term 37-38.6wks   Maternal Data    Feeding Feeding Type: Breast Milk  LATCH Score Latch: Too sleepy or reluctant, no latch achieved, no sucking elicited.  Audible Swallowing: None  Type of Nipple: Flat  Comfort (Breast/Nipple): Soft / non-tender  Hold (Positioning): Full assist, staff holds infant at breast  LATCH Score: 3  Interventions Interventions: Breast feeding basics reviewed;Assisted with latch;Breast compression;Shells;Skin to skin;Adjust position;Breast massage;Support pillows;Hand pump;Hand express;Position options;DEBP;Pre-pump if needed;Expressed milk  Lactation Tools Discussed/Used Tools: Shells;Pump;Flanges;Nipple Shields Nipple shield size: 16 (no # 20 available, #24 to large) Flange Size: 21 Shell Type: Inverted Breast pump type: Double-Electric Breast Pump Pump Review: Setup,  frequency, and cleaning;Milk Storage Initiated by:: Peri Jefferson RN IBCLC Date initiated:: 07/29/20   Consult Status Consult Status: Follow-up Date: 07/29/20 Follow-up type: In-patient    Charyl Dancer 07/29/2020, 4:43 AM

## 2020-07-29 NOTE — Addendum Note (Signed)
Addendum  created 07/29/20 0827 by Renford Dills, CRNA   Clinical Note Signed

## 2020-07-29 NOTE — Lactation Note (Signed)
This note was copied from a baby's chart. Lactation Consultation Note Baby 3 hrs old. Mom holding baby STS. Mom stated baby is to sleepy for feeding, she tried and baby isn't interested. Mom BF her 1st child 6 weeks. Mom has flat compressible small nipples. Glistening of colostrum noted.  Mom encouraged to feed baby 8-12 times/24 hours and with feeding cues. Newborn behavior, STS, I&O, supply and demand discussed.  Mom stated she was tired. Asked mom to call for latching assistance if needed. Lactation brochure given.  Patient Name: Boy Kolette Vey [id number] Date: 07/29/2020 Reason for consult: Initial assessment;Early term 37-38.6wks   Maternal Data Has patient been taught Hand Expression?: Yes Does the patient have breastfeeding experience prior to this delivery?: Yes  Feeding Feeding Type: Breast Fed  LATCH Score Latch: Too sleepy or reluctant, no latch achieved, no sucking elicited.     Type of Nipple: Flat  Comfort (Breast/Nipple): Soft / non-tender        Interventions Interventions: Skin to skin;Breast massage;Hand express  Lactation Tools Discussed/Used WIC Program: Yes   Consult Status Consult Status: Follow-up Date: 07/29/20 Follow-up type: In-patient    Charyl Dancer 07/29/2020, 12:35 AM

## 2020-07-30 LAB — SURGICAL PATHOLOGY

## 2020-07-30 MED ORDER — FERROUS SULFATE 325 (65 FE) MG PO TABS: 325.0000 mg | ORAL_TABLET | ORAL | 3 refills | Status: DC

## 2020-07-30 MED ORDER — IBUPROFEN 800 MG PO TABS: 800.0000 mg | ORAL_TABLET | Freq: Four times a day (QID) | ORAL | 0 refills | Status: DC

## 2020-07-30 MED ORDER — ACETAMINOPHEN 325 MG PO TABS: 650.0000 mg | ORAL_TABLET | ORAL | 2 refills | Status: AC | PRN

## 2020-07-30 MED ORDER — OXYCODONE HCL 5 MG PO TABS: 5.0000 mg | ORAL_TABLET | ORAL | 0 refills | Status: DC | PRN

## 2020-07-30 NOTE — Lactation Note (Signed)
This note was copied from a baby's chart. Lactation Consultation Note  Patient Name: Melanie Russo [id number] Date: 07/30/2020 Reason for consult: Follow-up assessment  P2 mother whose infant is now 49 hours old.  This is an ETI at 37+4 weeks.  Mother breast fed her first child (now 25 years old) for 6 weeks.  Mother had no questions/concerns related to breast feeding.  She has been breast feeding and supplementing with Similac 22 calorie formula.  Suggested mother supplement after EVERY feeding since this has not been consistently done.  Reminded mother to pump every three hours for breast stimulation even if she breast feeds and supplements.  This has not been consistently happening.    Engorgement prevention/treatment reviewed.  Father stated that he will assist mother by helping her remember to do these things.  Mother has a manual pump and a DEBP for home use.  She has our OP phone number for any concerns after discharge.  Baby has been discharged.   Maternal Data    Feeding Feeding Type: Breast Fed  LATCH Score Latch: Grasps breast easily, tongue down, lips flanged, rhythmical sucking.  Audible Swallowing: A few with stimulation  Type of Nipple: Everted at rest and after stimulation  Comfort (Breast/Nipple): Soft / non-tender  Hold (Positioning): No assistance needed to correctly position infant at breast.  LATCH Score: 9  Interventions    Lactation Tools Discussed/Used     Consult Status Consult Status: Complete Date: 07/30/20 Follow-up type: Call as needed    Anna-Marie Coller R Prestyn Mahn 07/30/2020, 10:53 AM

## 2020-07-31 ENCOUNTER — Encounter (HOSPITAL_COMMUNITY): Payer: Self-pay | Admitting: Obstetrics & Gynecology

## 2020-07-31 ENCOUNTER — Inpatient Hospital Stay (HOSPITAL_COMMUNITY)
Admission: AD | Admit: 2020-07-31 | Discharge: 2020-07-31 | Disposition: A | Discharge status: Home / Self Care | Payer: Medicaid Other | Attending: Obstetrics & Gynecology | Admitting: Obstetrics & Gynecology

## 2020-07-31 ENCOUNTER — Inpatient Hospital Stay (HOSPITAL_BASED_OUTPATIENT_CLINIC_OR_DEPARTMENT_OTHER): Payer: Medicaid Other

## 2020-07-31 DIAGNOSIS — O1093 Unspecified pre-existing hypertension complicating the puerperium: Secondary | ICD-10-CM | POA: Diagnosis not present

## 2020-07-31 DIAGNOSIS — O1495 Unspecified pre-eclampsia, complicating the puerperium: Secondary | ICD-10-CM | POA: Diagnosis not present

## 2020-07-31 DIAGNOSIS — M7989 Other specified soft tissue disorders: Secondary | ICD-10-CM

## 2020-07-31 DIAGNOSIS — R600 Localized edema: Secondary | ICD-10-CM

## 2020-07-31 DIAGNOSIS — M79604 Pain in right leg: Secondary | ICD-10-CM | POA: Insufficient documentation

## 2020-07-31 DIAGNOSIS — M3500 Sicca syndrome, unspecified: Secondary | ICD-10-CM | POA: Diagnosis not present

## 2020-07-31 DIAGNOSIS — R209 Unspecified disturbances of skin sensation: Secondary | ICD-10-CM | POA: Diagnosis not present

## 2020-07-31 DIAGNOSIS — O9089 Other complications of the puerperium, not elsewhere classified: Secondary | ICD-10-CM | POA: Insufficient documentation

## 2020-07-31 DIAGNOSIS — Z79899 Other long term (current) drug therapy: Secondary | ICD-10-CM | POA: Insufficient documentation

## 2020-07-31 DIAGNOSIS — O1205 Gestational edema, complicating the puerperium: Secondary | ICD-10-CM | POA: Diagnosis not present

## 2020-07-31 LAB — APTT: aPTT: 28 seconds (ref 24–36)

## 2020-07-31 LAB — CBC
HCT: 23.1 % — ABNORMAL LOW (ref 36.0–46.0)
Hemoglobin: 7.1 g/dL — ABNORMAL LOW (ref 12.0–15.0)
MCH: 25.6 pg — ABNORMAL LOW (ref 26.0–34.0)
MCHC: 30.7 g/dL (ref 30.0–36.0)
MCV: 83.4 fL (ref 80.0–100.0)
Platelets: 236 10*3/uL (ref 150–400)
RBC: 2.77 MIL/uL — ABNORMAL LOW (ref 3.87–5.11)
RDW: 16.8 % — ABNORMAL HIGH (ref 11.5–15.5)
WBC: 7.7 10*3/uL (ref 4.0–10.5)
nRBC: 0 % (ref 0.0–0.2)

## 2020-07-31 LAB — COMPREHENSIVE METABOLIC PANEL
ALT: 67 U/L — ABNORMAL HIGH (ref 0–44)
AST: 71 U/L — ABNORMAL HIGH (ref 15–41)
Albumin: 2.1 g/dL — ABNORMAL LOW (ref 3.5–5.0)
Alkaline Phosphatase: 123 U/L (ref 38–126)
Anion gap: 10 (ref 5–15)
BUN: 8 mg/dL (ref 6–20)
CO2: 20 mmol/L — ABNORMAL LOW (ref 22–32)
Calcium: 8.5 mg/dL — ABNORMAL LOW (ref 8.9–10.3)
Chloride: 106 mmol/L (ref 98–111)
Creatinine, Ser: 0.69 mg/dL (ref 0.44–1.00)
GFR, Estimated: >60 mL/min (ref >60)
Glucose, Bld: 83 mg/dL (ref 70–99)
Potassium: 4.1 mmol/L (ref 3.5–5.1)
Sodium: 136 mmol/L (ref 135–145)
Total Bilirubin: 0.4 mg/dL (ref 0.3–1.2)
Total Protein: 5.2 g/dL — ABNORMAL LOW (ref 6.5–8.1)

## 2020-07-31 LAB — TYPE AND SCREEN
ABO/RH(D): B POS
Antibody Screen: NEGATIVE

## 2020-07-31 LAB — PROTIME-INR
INR: 0.9 (ref 0.8–1.2)
Prothrombin Time: 11.2 seconds — ABNORMAL LOW (ref 11.4–15.2)

## 2020-07-31 NOTE — MAU Provider Note (Signed)
History     [id number]  Arrival date and time: 07/31/20 1531    Chief Complaint  Patient presents with  . Leg Pain     HPI Melanie Russo is a 24 y.o. s/p pLTCS on 07/29/2020 for NRFHT with PMHx notable for Sjogren's syndrome, chronic hypertension, GDMA2, who presents for right leg swelling.   Review of discharge summary from last admission on 07/29/2020: Admitted 07/27/2020 for IOl due to decreased fetal movement, found to have cHTN with superimposed pre-eclampsia. Progressed to 5 cm but then had terminal bradycardia of infant and went for stat pLTCS, small abruption found at time of cesarean. BP's were normal during the post partum period.   Today reports she woke up and her R leg was notably swollen to just above her R knee Leg also feels cold and numb at times, not necessarily painful Endorses anxiety which makes her chest feel tight, but nothing above her baseline that she has noticed No shortness of breath No personal hx of blood clots No immediate family with hx of blood clots but does have in distant relations (mother's aunts) No headache, vision changes Was not on any medications for blood pressure during pregnancy  Bleeding is light Still having incisional pain but manageable   --/--/B POS (12/23 1654)  OB History    Gravida  2   Para  2   Term  2   Preterm      AB      Living  2     SAB      IAB      Ectopic      Multiple  0   Live Births  2           Past Medical History:  Diagnosis Date  . Anemia   . Arthritis   . Gestational diabetes   . Hypertension    gestational   . Sjogren's syndrome [facility])     Past Surgical History:  Procedure Laterality Date  . CESAREAN SECTION N/A 07/28/2020   Procedure: CESAREAN SECTION;  Surgeon: Lazaro Arms, MD;  Location: [facility] LD ORS;  Service: Obstetrics;  Laterality: N/A;  . NO PAST SURGERIES      Family History  Problem Relation Age of Onset  . Hypertension Father   . Arthritis Father    . Obesity Maternal Uncle   . Cancer Maternal Grandmother        brain  . Heart disease Paternal Grandmother   . Hypertension Paternal Grandmother   . Thyroid disease Paternal Grandmother   . Thyroid disease Paternal Grandfather     Social History   Socioeconomic History  . Marital status: Married    Spouse name: Not on file  . Number of children: Not on file  . Years of education: Not on file  . Highest education level: Not on file  Occupational History  . Not on file  Tobacco Use  . Smoking status: Never Smoker  . Smokeless tobacco: Never Used  Vaping Use  . Vaping Use: Never used  Substance and Sexual Activity  . Alcohol use: Not Currently    Comment: rarely  . Drug use: Never  . Sexual activity: Not Currently    Birth control/protection: None    Comment: pt reports it has been over a month - 06/26/2020  Other Topics Concern  . Not on file  Social History Narrative  . Not on file   Social Determinants of Health   Financial Resource Strain: Low Risk   .  Difficulty of Paying Living Expenses: Not hard at all  Food Insecurity: No Food Insecurity  . Worried About Programme researcher, broadcasting/film/video in the Last Year: Never true  . Ran Out of Food in the Last Year: Never true  Transportation Needs: No Transportation Needs  . Lack of Transportation (Medical): No  . Lack of Transportation (Non-Medical): No  Physical Activity: Insufficiently Active  . Days of Exercise per Week: 1 day  . Minutes of Exercise per Session: 30 min  Stress: Stress Concern Present  . Feeling of Stress : Very much  Social Connections: Moderately Isolated  . Frequency of Communication with Friends and Family: More than three times a week  . Frequency of Social Gatherings with Friends and Family: Never  . Attends Religious Services: Never  . Active Member of Clubs or Organizations: No  . Attends Banker Meetings: Never  . Marital Status: Married  Catering manager Violence: Not At Risk  . Fear  of Current or Ex-Partner: No  . Emotionally Abused: No  . Physically Abused: No  . Sexually Abused: No    Allergies  Allergen Reactions  . Kiwi Extract Anaphylaxis  . Other Anaphylaxis    Blueberries, blackberries, raspberries, nuts- pt reports that it feels like she is being stabbed in the mouth when eating nuts   . Latex   . Penicillins     No current facility-administered medications on file prior to encounter.   Current Outpatient Medications on File Prior to Encounter  Medication Sig Dispense Refill  . acetaminophen (TYLENOL) 325 MG tablet Take 2 tablets (650 mg total) by mouth every 4 (four) hours as needed. 100 tablet 2  . butalbital-acetaminophen-caffeine (FIORICET) 50-325-40 MG tablet Take 1-2 tablets by mouth every 6 (six) hours as needed for headache. Take 1-2 tablets by mouth every 6 (six) hours as needed for headache.    . ferrous sulfate 325 (65 FE) MG tablet Take 1 tablet (325 mg total) by mouth every other day. 30 tablet 3  . ibuprofen (ADVIL) 800 MG tablet Take 1 tablet (800 mg total) by mouth every 6 (six) hours. 30 tablet 0  . oxyCODONE (OXY IR/ROXICODONE) 5 MG immediate release tablet Take 1 tablet (5 mg total) by mouth every 4 (four) hours as needed for severe pain. 15 tablet 0  . Prenatal Vit-Fe Fumarate-FA (MULTIVITAMIN-PRENATAL) 27-0.8 MG TABS tablet Take 1 tablet by mouth daily at 12 noon.    Marland Kitchen acetaminophen (TYLENOL) 500 MG tablet Take 1,000 mg by mouth every 6 (six) hours as needed.       ROS Pertinent positives and negative per HPI, all others reviewed and negative  Physical Exam   BP 137/72   Pulse (!) 109   Temp 98.7 F (37.1 C)   Resp 16   SpO2 98%   Physical Exam Vitals reviewed.  Constitutional:      General: She is not in acute distress.    Appearance: She is well-developed and well-nourished. She is not diaphoretic.  Eyes:     General: No scleral icterus. Cardiovascular:     Rate and Rhythm: Regular rhythm. Tachycardia present.      Comments: RLE visibly mildly more swollen than LLE. Bilaterally warm and well perfused with intact sensation. Pulmonary:     Effort: Pulmonary effort is normal. No respiratory distress.  Abdominal:     General: There is no distension.     Palpations: Abdomen is soft.     Tenderness: There is no abdominal tenderness. There is  no guarding or rebound.  Musculoskeletal:        General: No edema.  Skin:    General: Skin is warm and dry.  Neurological:     Mental Status: She is alert.     Coordination: Coordination normal.  Psychiatric:        Mood and Affect: Mood and affect normal.      Cervical Exam     Bedside Ultrasound Not done  My interpretation: n/a  FHT N/a  Labs Results for orders placed or performed during the hospital encounter of 07/31/20 (from the past 24 hour(s))  CBC     Status: Abnormal   Collection Time: 07/31/20  4:54 PM  Result Value Ref Range   WBC 7.7 4.0 - 10.5 K/uL   RBC 2.77 (L) 3.87 - 5.11 MIL/uL   Hemoglobin 7.1 (L) 12.0 - 15.0 g/dL   HCT 41.323.1 (L) 24.436.0 - 01.046.0 %   MCV 83.4 80.0 - 100.0 fL   MCH 25.6 (L) 26.0 - 34.0 pg   MCHC 30.7 30.0 - 36.0 g/dL   RDW 27.216.8 (H) 53.611.5 - 64.415.5 %   Platelets 236 150 - 400 K/uL   nRBC 0.0 0.0 - 0.2 %  Comprehensive metabolic panel     Status: Abnormal   Collection Time: 07/31/20  4:54 PM  Result Value Ref Range   Sodium 136 135 - 145 mmol/L   Potassium 4.1 3.5 - 5.1 mmol/L   Chloride 106 98 - 111 mmol/L   CO2 20 (L) 22 - 32 mmol/L   Glucose, Bld 83 70 - 99 mg/dL   BUN 8 6 - 20 mg/dL   Creatinine, Ser 0.340.69 0.44 - 1.00 mg/dL   Calcium 8.5 (L) 8.9 - 10.3 mg/dL   Total Protein 5.2 (L) 6.5 - 8.1 g/dL   Albumin 2.1 (L) 3.5 - 5.0 g/dL   AST 71 (H) 15 - 41 U/L   ALT 67 (H) 0 - 44 U/L   Alkaline Phosphatase 123 38 - 126 U/L   Total Bilirubin 0.4 0.3 - 1.2 mg/dL   GFR, Estimated >74>60 >25>60 mL/min   Anion gap 10 5 - 15  Type and screen [facility]     Status: None   Collection Time: 07/31/20  4:54 PM   Result Value Ref Range   ABO/RH(D) B POS    Antibody Screen NEG    Sample Expiration      08/03/2020,2359 Performed at [facility], [address] [place], [place] [id number]   Protime-INR     Status: Abnormal   Collection Time: 07/31/20  4:54 PM  Result Value Ref Range   Prothrombin Time 11.2 (L) 11.4 - 15.2 seconds   INR 0.9 0.8 - 1.2  APTT     Status: None   Collection Time: 07/31/20  4:54 PM  Result Value Ref Range   aPTT 28 24 - 36 seconds    Imaging VAS US LOWER EXTREMITY VENOUS (DVT)  Result Date: 07/31/2020  Lower Venous DVT Study Indications: Swelling, and Cold foot post partum.  Comparison Study: No prior study on file Performing Technologist: Sherren Kernsandace Kanady RVS  Examination Guidelines: A complete evaluation includes B-mode imaging, spectral Doppler, color Doppler, and power Doppler as needed of all accessible portions of each vessel. Bilateral testing is considered an integral part of a complete examination. Limited examinations for reoccurring indications may be performed as noted. The reflux portion of the exam is performed with the patient in reverse Trendelenburg.  +---------+---------------+---------+-----------+----------+--------------+  RIGHT    CompressibilityPhasicitySpontaneityPropertiesThrombus Aging +---------+---------------+---------+-----------+----------+--------------+ CFV      Full           Yes      Yes                                 +---------+---------------+---------+-----------+----------+--------------+ SFJ      Full                                                        +---------+---------------+---------+-----------+----------+--------------+ FV Prox  Full                                                        +---------+---------------+---------+-----------+----------+--------------+ FV Mid   Full                                                         +---------+---------------+---------+-----------+----------+--------------+ FV DistalFull                                                        +---------+---------------+---------+-----------+----------+--------------+ PFV      Full                                                        +---------+---------------+---------+-----------+----------+--------------+ POP      Full           Yes      Yes                                 +---------+---------------+---------+-----------+----------+--------------+ PTV      Full                                                        +---------+---------------+---------+-----------+----------+--------------+ PERO     Full                                                        +---------+---------------+---------+-----------+----------+--------------+   +----+---------------+---------+-----------+----------+--------------+ LEFTCompressibilityPhasicitySpontaneityPropertiesThrombus Aging +----+---------------+---------+-----------+----------+--------------+ CFV Full           Yes      Yes                                 +----+---------------+---------+-----------+----------+--------------+  Summary: RIGHT: - There is no evidence of deep vein thrombosis in the lower extremity.  LEFT: - No evidence of common femoral vein obstruction.  *See table(s) above for measurements and observations.    Preliminary     MAU Course  Procedures  Lab Orders     CBC     Comprehensive metabolic panel     Protime-INR     APTT No orders of the defined types were placed in this encounter.   Imaging Orders     VAS [place] LOWER EXTREMITY VENOUS (DVT)  MDM moderate  Assessment and Plan  #Right lower extremity swelling C/f DVT given post partum and post op, thankfully RLE [place] negative for DVT as well as L common femoral vein. Swelling likely secondary to fluid shifts in post partum period, reassured patient and reviewed warning signs.    #cHTN Borderline severe range BP's on arrival, but subsequently normotensive. PreE labs ordered on arrival, notable only for elevation of LFT's but <2x upper limit of normal and has been up and down over the past week. Patient is currently asymptomatic and normotensive, given this I have low suspicion for severe PP pre-eclampsia but reviewed warning signs/return precautions with her in detail. Will send message to FT to recheck LFTs at follow up visit.   Discharged to home in stable condition.    Venora Maples

## 2020-07-31 NOTE — Discharge Instructions (Signed)
Postpartum Hypertension Postpartum hypertension is high blood pressure that remains higher than normal after childbirth. You may not realize that you have postpartum hypertension if your blood pressure is not being checked regularly. In most cases, postpartum hypertension will go away on its own, usually within a week of delivery. However, for some women, medical treatment is required to prevent serious complications, such as seizures or stroke. What are the causes? This condition may be caused by one or more of the following:  Hypertension that existed before pregnancy (chronic hypertension).  Hypertension that comes on as a result of pregnancy (gestational hypertension).  Hypertensive disorders during pregnancy (preeclampsia) or seizures in women who have high blood pressure during pregnancy (eclampsia).  A condition in which the liver, platelets, and red blood cells are damaged during pregnancy (HELLP syndrome).  A condition in which the thyroid produces too much hormones (hyperthyroidism).  Other rare problems of the nerves (neurological disorders) or blood disorders. In some cases, the cause may not be known. What increases the risk? The following factors may make you more likely to develop this condition:  Chronic hypertension. In some cases, this may not have been diagnosed before pregnancy.  Obesity.  Type 2 diabetes.  Kidney disease.  History of preeclampsia or eclampsia.  Other medical conditions that change the level of hormones in the body (hormonal imbalance). What are the signs or symptoms? As with all types of hypertension, postpartum hypertension may not have any symptoms. Depending on how high your blood pressure is, you may experience:  Headaches. These may be mild, moderate, or severe. They may also be steady, constant, or sudden in onset (thunderclap headache).  Changes in your ability to see (visual changes).  Dizziness.  Shortness of breath.  Swelling  of your hands, feet, lower legs, or face. In some cases, you may have swelling in more than one of these locations.  Heart palpitations or a racing heartbeat.  Difficulty breathing while lying down.  Decrease in the amount of urine that you pass. Other rare signs and symptoms may include:  Sweating more than usual. This lasts longer than a few days after delivery.  Chest pain.  Sudden dizziness when you get up from sitting or lying down.  Seizures.  Nausea or vomiting.  Abdominal pain. How is this diagnosed? This condition may be diagnosed based on the results of a physical exam, blood pressure measurements, and blood and urine tests. You may also have other tests, such as a CT scan or an MRI, to check for other problems of postpartum hypertension. How is this treated? If blood pressure is high enough to require treatment, your options may include:  Medicines to reduce blood pressure (antihypertensives). Tell your health care provider if you are breastfeeding or if you plan to breastfeed. There are many antihypertensive medicines that are safe to take while breastfeeding.  Stopping medicines that may be causing hypertension.  Treating medical conditions that are causing hypertension.  Treating the complications of hypertension, such as seizures, stroke, or kidney problems. Your health care provider will also continue to monitor your blood pressure closely until it is within a safe range for you. Follow these instructions at home:  Take over-the-counter and prescription medicines only as told by your health care provider.  Return to your normal activities as told by your health care provider. Ask your health care provider what activities are safe for you.  Do not use any products that contain nicotine or tobacco, such as cigarettes and e-cigarettes. If   you need help quitting, ask your health care provider.  Keep all follow-up visits as told by your health care provider. This  is important. Contact a health care provider if:  Your symptoms get worse.  You have new symptoms, such as: ? A headache that does not get better. ? Dizziness. ? Visual changes. Get help right away if:  You suddenly develop swelling in your hands, ankles, or face.  You have sudden, rapid weight gain.  You develop difficulty breathing, chest pain, racing heartbeat, or heart palpitations.  You develop severe pain in your abdomen.  You have any symptoms of a stroke. "BE FAST" is an easy way to remember the main warning signs of a stroke: ? B - Balance. Signs are dizziness, sudden trouble walking, or loss of balance. ? E - Eyes. Signs are trouble seeing or a sudden change in vision. ? F - Face. Signs are sudden weakness or numbness of the face, or the face or eyelid drooping on one side. ? A - Arms. Signs are weakness or numbness in an arm. This happens suddenly and usually on one side of the body. ? S - Speech. Signs are sudden trouble speaking, slurred speech, or trouble understanding what people say. ? T - Time. Time to call emergency services. Write down what time symptoms started.  You have other signs of a stroke, such as: ? A sudden, severe headache with no known cause. ? Nausea or vomiting. ? Seizure. These symptoms may represent a serious problem that is an emergency. Do not wait to see if the symptoms will go away. Get medical help right away. Call your local emergency services (911 in the [place] Do not drive yourself to the hospital. Summary  Postpartum hypertension is high blood pressure that remains higher than normal after childbirth.  In most cases, postpartum hypertension will go away on its own, usually within a week of delivery.  For some women, medical treatment is required to prevent serious complications, such as seizures or stroke. This information is not intended to replace advice given to you by your health care provider. Make sure you discuss any questions  you have with your health care provider. Document Revised: 09/01/2018 Document Reviewed: 05/16/2017 Elsevier Patient Education  2020 [employer]vier Inc.  

## 2020-07-31 NOTE — MAU Note (Signed)
.   Melanie Russo is a 25 y.o.  here in MAU reporting: right leg pain.States that her leg is cold and she cant feel her leg. C/S on 07/28/20.  Onset of complaint: today Pain score: 5 Vitals:   07/31/20 1554  BP: (!) 158/101  Pulse: 99  Resp: 16  Temp: 98.7 F (37.1 C)  SpO2: 99%     FHT: Lab orders placed from triage:

## 2020-07-31 NOTE — Progress Notes (Signed)
VASCULAR LAB    Right lower extremity venous duplex has been performed.  See CV proc for preliminary results.   Keionna Kinnaird, RVT 07/31/2020, 6:00 PM

## 2020-08-04 ENCOUNTER — Encounter: Payer: Self-pay | Admitting: *Deleted

## 2020-08-04 ENCOUNTER — Telehealth: Payer: Self-pay | Admitting: Obstetrics & Gynecology

## 2020-08-04 NOTE — Telephone Encounter (Signed)
Pt had a C-section on 07/28/2020, wonders when does she remove the tape on the incision?  She's confuse about her incision discharge instructions Please advise & notify pt

## 2020-08-04 NOTE — Telephone Encounter (Signed)
MyChart message sent. JSY

## 2020-08-04 NOTE — Telephone Encounter (Signed)
Patient left message and wanted to let the Doctor know that her one of her legs & one  foot are swollen(patient didn't say which one) and one is colder than the other. Clinical staff will follow up with patient.

## 2020-08-04 NOTE — Telephone Encounter (Signed)
Pt was advised can remove honeycomb dressing but don't forcefully remove steri-strips. If steri-strips come off on their own, that's fine. Pt had to cancel appt for tomorrow. She rescheduled to 08/15/20. Pt did have some swelling in leg and calf. Went to hospital. All looks good; no blood clot. JSY

## 2020-08-04 NOTE — Telephone Encounter (Signed)
Left message @ 10:42 am. JSY

## 2020-08-04 NOTE — Telephone Encounter (Signed)
Pt was advised if further questions, feel free to call or send MyChart message. Pt voiced understanding. JSY

## 2020-08-05 ENCOUNTER — Encounter: Payer: Medicaid Other | Admitting: Women's Health

## 2020-08-06 ENCOUNTER — Other Ambulatory Visit: Payer: Medicaid Other

## 2020-08-06 ENCOUNTER — Encounter: Payer: Medicaid Other | Admitting: Advanced Practice Midwife

## 2020-08-12 ENCOUNTER — Encounter: Payer: Medicaid Other | Admitting: Obstetrics & Gynecology

## 2020-08-13 DIAGNOSIS — F411 Generalized anxiety disorder: Secondary | ICD-10-CM | POA: Diagnosis not present

## 2020-08-15 ENCOUNTER — Encounter: Payer: Self-pay | Admitting: Obstetrics & Gynecology

## 2020-08-15 ENCOUNTER — Ambulatory Visit (INDEPENDENT_AMBULATORY_CARE_PROVIDER_SITE_OTHER): Payer: Medicaid Other | Admitting: Obstetrics & Gynecology

## 2020-08-15 ENCOUNTER — Other Ambulatory Visit: Payer: Self-pay

## 2020-08-15 VITALS — BP 131/89 | HR 111 | Wt 231.0 lb

## 2020-08-15 DIAGNOSIS — R7989 Other specified abnormal findings of blood chemistry: Secondary | ICD-10-CM

## 2020-08-15 DIAGNOSIS — Z98891 History of uterine scar from previous surgery: Secondary | ICD-10-CM

## 2020-08-15 MED ORDER — NORETHINDRONE 0.35 MG PO TABS: 1.0000 | ORAL_TABLET | Freq: Every day | ORAL | 11 refills | Status: DC

## 2020-08-15 NOTE — Progress Notes (Signed)
Preoperative History and Physical  Melanie Russo is a 25 y.o. [id number] with No LMP recorded. admitted for a laparoscopic bilateral salpingectomy for permanent sterilization.  She understands various tubal ligation options but selected bilateral salpingectomy  PMH:    Past Medical History:  Diagnosis Date  . Anemia   . Arthritis   . Gestational diabetes   . Hypertension    gestational   . Sjogren's syndrome (HCC)     PSH:     Past Surgical History:  Procedure Laterality Date  . CESAREAN SECTION N/A 07/28/2020   Procedure: CESAREAN SECTION;  Surgeon: Lazaro ArmsEure, Patria Warzecha H, MD;  Location: [facility];  Service: Obstetrics;  Laterality: N/A;  . NO PAST SURGERIES      POb/GynH:      OB History    Gravida  2   Para  2   Term  2   Preterm      AB      Living  2     SAB      IAB      Ectopic      Multiple  0   Live Births  2           SH:   Social History   Tobacco Use  . Smoking status: Never Smoker  . Smokeless tobacco: Never Used  Vaping Use  . Vaping Use: Never used  Substance Use Topics  . Alcohol use: Not Currently    Comment: rarely  . Drug use: Never    FH:    Family History  Problem Relation Age of Onset  . Hypertension Father   . Arthritis Father   . Obesity Maternal Uncle   . Cancer Maternal Grandmother        brain  . Heart disease Paternal Grandmother   . Hypertension Paternal Grandmother   . Thyroid disease Paternal Grandmother   . Thyroid disease Paternal Grandfather      Allergies:  Allergies  Allergen Reactions  . Kiwi Extract Anaphylaxis  . Other Anaphylaxis    Blueberries, blackberries, raspberries, nuts- pt reports that it feels like she is being stabbed in the mouth when eating nuts   . Latex   . Penicillins     Medications:       Current Outpatient Medications:  .  acetaminophen (TYLENOL) 325 MG tablet, Take 2 tablets (650 mg total) by mouth every 4 (four) hours as needed., Disp: 100 tablet, Rfl: 2 .  acetaminophen  (TYLENOL) 500 MG tablet, Take 1,000 mg by mouth every 6 (six) hours as needed., Disp: , Rfl:  .  ferrous sulfate 325 (65 FE) MG tablet, Take 1 tablet (325 mg total) by mouth every other day., Disp: 30 tablet, Rfl: 3 .  ibuprofen (ADVIL) 800 MG tablet, Take 1 tablet (800 mg total) by mouth every 6 (six) hours., Disp: 30 tablet, Rfl: 0 .  norethindrone (MICRONOR) 0.35 MG tablet, Take 1 tablet (0.35 mg total) by mouth daily. Take 1 a day, Disp: 28 tablet, Rfl: 11 .  Prenatal Vit-Fe Fumarate-FA (MULTIVITAMIN-PRENATAL) 27-0.8 MG TABS tablet, Take 1 tablet by mouth daily at 12 noon., Disp: , Rfl:   Review of Systems:   Review of Systems  Constitutional: Negative for fever, chills, weight loss, malaise/fatigue and diaphoresis.  HENT: Negative for hearing loss, ear pain, nosebleeds, congestion, sore throat, neck pain, tinnitus and ear discharge.   Eyes: Negative for blurred vision, double vision, photophobia, pain, discharge and redness.  Respiratory: Negative for cough, hemoptysis, sputum  production, shortness of breath, wheezing and stridor.   Cardiovascular: Negative for chest pain, palpitations, orthopnea, claudication, leg swelling and PND.  Gastrointestinal: Positive for abdominal pain. Negative for heartburn, nausea, vomiting, diarrhea, constipation, blood in stool and melena.  Genitourinary: Negative for dysuria, urgency, frequency, hematuria and flank pain.  Musculoskeletal: Negative for myalgias, back pain, joint pain and falls.  Skin: Negative for itching and rash.  Neurological: Negative for dizziness, tingling, tremors, sensory change, speech change, focal weakness, seizures, loss of consciousness, weakness and headaches.  Endo/Heme/Allergies: Negative for environmental allergies and polydipsia. Does not bruise/bleed easily.  Psychiatric/Behavioral: Negative for depression, suicidal ideas, hallucinations, memory loss and substance abuse. The patient is not nervous/anxious and does not have  insomnia.      PHYSICAL EXAM:  Blood pressure 131/89, pulse (!) 111, weight 231 lb (104.8 kg), currently breastfeeding.    Vitals reviewed. Constitutional: She is oriented to person, place, and time. She appears well-developed and well-nourished.  HENT:  Head: Normocephalic and atraumatic.  Right Ear: External ear normal.  Left Ear: External ear normal.  Nose: Nose normal.  Mouth/Throat: Oropharynx is clear and moist.  Eyes: Conjunctivae and EOM are normal. Pupils are equal, round, and reactive to light. Right eye exhibits no discharge. Left eye exhibits no discharge. No scleral icterus.  Neck: Normal range of motion. Neck supple. No tracheal deviation present. No thyromegaly present.  Cardiovascular: Normal rate, regular rhythm, normal heart sounds and intact distal pulses.  Exam reveals no gallop and no friction rub.   No murmur heard. Respiratory: Effort normal and breath sounds normal. No respiratory distress. She has no wheezes. She has no rales. She exhibits no tenderness.  GI: Soft. Bowel sounds are normal. She exhibits no distension and no mass. There is tenderness. There is no rebound and no guarding.  Incision clean dry intact Genitourinary:       Vulva is normal without lesions Vagina is pink moist without discharge Cervix normal in appearance and pap is normal Uterus is normal size, contour, position, consistency, mobility, non-tender Adnexa is negative with normal sized ovaries by sonogram  Musculoskeletal: Normal range of motion. She exhibits no edema and no tenderness.  Neurological: She is alert and oriented to person, place, and time. She has normal reflexes. She displays normal reflexes. No cranial nerve deficit. She exhibits normal muscle tone. Coordination normal.  Skin: Skin is warm and dry. No rash noted. No erythema. No pallor.  Psychiatric: She has a normal mood and affect. Her behavior is normal. Judgment and thought content normal.    Labs: No results  found for this or any previous visit (from the past 336 hour(s)).  EKG: Orders placed or performed during the hospital encounter of 07/31/20  . EKG 12-Lead  . EKG 12-Lead  . EKG    Imaging Studies: US OB Follow Up  Result Date: 07/24/2020  [facility] [address] [place] [address] FOLLOW UP SONOGRAM Melanie Russo is in the office for a follow up sonogram for EFW,BPP and cord dopplers. She is a 25 y.o. year old G35P1001 with Estimated Date of Delivery: 08/14/20 by LMP now at  [redacted]w[redacted]d weeks gestation. Thus far the pregnancy has been complicated by CHTN,GDMA2. GESTATION: SINGLETON PRESENTATION: cephalic FETAL ACTIVITY:          Heart rate         119          The fetus is active. AMNIOTIC FLUID: The amniotic fluid volume  is  normal, 11 cm. PLACENTA LOCALIZATION:  posterior GRADE 3 CERVIX: Limited view GESTATIONAL AGE AND  BIOMETRICS: Gestational criteria: Estimated Date of Delivery: 08/14/20 by LMP now at [redacted]w[redacted]d Previous Scans:6          BIPARIETAL DIAMETER           9.34 cm         38 weeks HEAD CIRCUMFERENCE           33.33 cm         38 weeks ABDOMINAL CIRCUMFERENCE           31.56 cm         35+3 weeks FEMUR LENGTH           6.98 cm         35+5 weeks                                                       AVERAGE EGA(BY THIS SCAN):  36+5 weeks                                                 ESTIMATED FETAL WEIGHT:       2854  grams, 39 % BIOPHYSICAL PROFILE:                                                                                                      COMMENTS GROSS BODY MOVEMENT                 2  TONE                2  RESPIRATIONS                2  AMNIOTIC FLUID                2                                                          SCORE:  8/8 (Note: NST was not performed as part of this antepartum testing) DOPPLER FLOW STUDIES: UMBILICAL ARTERY RI RATIOS:   .62,.60,.59=69% ANATOMICAL SURVEY                                                                             COMMENTS  CEREBRAL VENTRICLES yes normal  CHOROID PLEXUS yes normal  CEREBELLUM yes normal  CISTERNA MAGNA  Yes  normal   CAVUM SEPTI PELLUCIDI YES NORMAL      ORBITS yes normal  NASAL BONE yes normal  NOSE/LIP yes normal  FACIAL PROFILE yes normal  4 CHAMBERED HEART yes normal  OUTFLOW TRACTS YES normaL  3VV YES NORMAL  3VTV YES NORMAL  SITUS YES NORMAL      DIAPHRAGM yes normal  STOMACH yes normal  RENAL REGION yes normal  BLADDER yes normal          3 VESSEL CORD yes normal              GENITALIA yes normal female     SUSPECTED ABNORMALITIES:  no QUALITY OF SCAN: satisfactory TECHNICIAN COMMENTS: [place] 78+5 wks,cephalic,BPP 8/8,FOYDXAJOI placenta gr 3,fhr 119 bpm,RI .62,.60,.59=69%,EFW 2854 g 39% A copy of this report including all images has been saved and backed up to a second source for retrieval if needed. All measures and details of the anatomical scan, placentation, fluid volume and pelvic anatomy are contained in that report. Melanie Russo 07/22/2020 10:57 AM Clinical Impression and recommendations: I have reviewed the sonogram results above, combined with the patient's current clinical course, below are my impressions and any appropriate recommendations for management based on the sonographic findings. 1.  G2P1001 Estimated Date of Delivery: 08/14/20 by serial sonographic evaluations 2.  Fetal sonographic surveillance findings: a). Normal fluid volume b). Normal antepartum fetal assessment with BPP 8/8 c). Normal fetal Doppler ratios with consistent diastolic flow 78% d). Normal growth percentile with appropriate interval growth 39% 3.  Normal general sonographic findings Recommend continued prenatal evaluations and care based on this sonogram and as clinically indicated from the patient's clinical course. Mertie Clause Dionna Wiedemann 07/24/2020 12:05 PM   US FETAL BPP WO NON STRESS  Result Date: 07/24/2020  [facility] [place] [address] FOLLOW UP SONOGRAM  Nargis Abrams is in the office for a follow up sonogram for EFW,BPP and cord dopplers. She is a 25 y.o. year old G69P1001 with Estimated Date of Delivery: 08/14/20 by LMP now at  [redacted]w[redacted]d weeks gestation. Thus far the pregnancy has been complicated by CHTN,GDMA2. GESTATION: SINGLETON PRESENTATION: cephalic FETAL ACTIVITY:          Heart rate         119          The fetus is active. AMNIOTIC FLUID: The amniotic fluid volume is  normal, 11 cm. PLACENTA LOCALIZATION:  posterior GRADE 3 CERVIX: Limited view GESTATIONAL AGE AND  BIOMETRICS: Gestational criteria: Estimated Date of Delivery: 08/14/20 by LMP now at [redacted]w[redacted]d Previous Scans:6          BIPARIETAL DIAMETER           9.34 cm         38 weeks HEAD CIRCUMFERENCE           33.33 cm         38 weeks ABDOMINAL CIRCUMFERENCE           31.56 cm         35+3 weeks FEMUR LENGTH           6.98 cm         35+5 weeks  AVERAGE EGA(BY THIS SCAN):  36+5 weeks                                                 ESTIMATED FETAL WEIGHT:       2854  grams, 39 % BIOPHYSICAL PROFILE:                                                                                                      COMMENTS GROSS BODY MOVEMENT                 2  TONE                2  RESPIRATIONS                2  AMNIOTIC FLUID                2                                                          SCORE:  8/8 (Note: NST was not performed as part of this antepartum testing) DOPPLER FLOW STUDIES: UMBILICAL ARTERY RI RATIOS:   .62,.60,.59=69% ANATOMICAL SURVEY                                                                            COMMENTS CEREBRAL VENTRICLES yes normal  CHOROID PLEXUS yes normal  CEREBELLUM yes normal  CISTERNA MAGNA  Yes  normal   CAVUM SEPTI PELLUCIDI YES NORMAL      ORBITS yes normal  NASAL BONE yes normal  NOSE/LIP yes normal  FACIAL PROFILE yes normal  4 CHAMBERED HEART yes normal  OUTFLOW TRACTS YES normaL  3VV YES NORMAL  3VTV YES NORMAL  SITUS  YES NORMAL      DIAPHRAGM yes normal  STOMACH yes normal  RENAL REGION yes normal  BLADDER yes normal          3 VESSEL CORD yes normal              GENITALIA yes normal female     SUSPECTED ABNORMALITIES:  no QUALITY OF SCAN: satisfactory TECHNICIAN COMMENTS: [place] 36+5 wks,cephalic,BPP 8/8,posterior placenta gr 3,fhr 119 bpm,RI .62,.60,.59=69%,EFW 2854 g 39% A copy of this report including all images has been saved and backed up to a second source for retrieval if needed. All measures and details of the anatomical scan, placentation, fluid volume and pelvic anatomy are contained in that report.  Melanie Russo 07/22/2020 10:57 AM Clinical Impression and recommendations: I have reviewed the sonogram results above, combined with the patient's current clinical course, below are my impressions and any appropriate recommendations for management based on the sonographic findings. 1.  G2P1001 Estimated Date of Delivery: 08/14/20 by serial sonographic evaluations 2.  Fetal sonographic surveillance findings: a). Normal fluid volume b). Normal antepartum fetal assessment with BPP 8/8 c). Normal fetal Doppler ratios with consistent diastolic flow 69% d). Normal growth percentile with appropriate interval growth 39% 3.  Normal general sonographic findings Recommend continued prenatal evaluations and care based on this sonogram and as clinically indicated from the patient's clinical course. Melanie Russo 07/24/2020 12:05 PM   [place] UA Cord Doppler  Result Date: 07/24/2020  [facility] [address] [place] [address] FOLLOW UP SONOGRAM Melanie Russo is in the office for a follow up sonogram for EFW,BPP and cord dopplers. She is a 25 y.o. year old G19P1001 with Estimated Date of Delivery: 08/14/20 by LMP now at  [redacted]w[redacted]d weeks gestation. Thus far the pregnancy has been complicated by CHTN,GDMA2. GESTATION: SINGLETON PRESENTATION: cephalic FETAL ACTIVITY:          Heart rate         119          The  fetus is active. AMNIOTIC FLUID: The amniotic fluid volume is  normal, 11 cm. PLACENTA LOCALIZATION:  posterior GRADE 3 CERVIX: Limited view GESTATIONAL AGE AND  BIOMETRICS: Gestational criteria: Estimated Date of Delivery: 08/14/20 by LMP now at [redacted]w[redacted]d Previous Scans:6          BIPARIETAL DIAMETER           9.34 cm         38 weeks HEAD CIRCUMFERENCE           33.33 cm         38 weeks ABDOMINAL CIRCUMFERENCE           31.56 cm         35+3 weeks FEMUR LENGTH           6.98 cm         35+5 weeks                                                       AVERAGE EGA(BY THIS SCAN):  36+5 weeks                                                 ESTIMATED FETAL WEIGHT:       2854  grams, 39 % BIOPHYSICAL PROFILE:  COMMENTS GROSS BODY MOVEMENT                 2  TONE                2  RESPIRATIONS                2  AMNIOTIC FLUID                2                                                          SCORE:  8/8 (Note: NST was not performed as part of this antepartum testing) DOPPLER FLOW STUDIES: UMBILICAL ARTERY RI RATIOS:   .62,.60,.59=69% ANATOMICAL SURVEY                                                                            COMMENTS CEREBRAL VENTRICLES yes normal  CHOROID PLEXUS yes normal  CEREBELLUM yes normal  CISTERNA MAGNA  Yes  normal   CAVUM SEPTI PELLUCIDI YES NORMAL      ORBITS yes normal  NASAL BONE yes normal  NOSE/LIP yes normal  FACIAL PROFILE yes normal  4 CHAMBERED HEART yes normal  OUTFLOW TRACTS YES normaL  3VV YES NORMAL  3VTV YES NORMAL  SITUS YES NORMAL      DIAPHRAGM yes normal  STOMACH yes normal  RENAL REGION yes normal  BLADDER yes normal          3 VESSEL CORD yes normal              GENITALIA yes normal female     SUSPECTED ABNORMALITIES:  no QUALITY OF SCAN: satisfactory TECHNICIAN COMMENTS: [place] 36+5 wks,cephalic,BPP 8/8,posterior placenta gr 3,fhr 119 bpm,RI .62,.60,.59=69%,EFW 2854 g 39% A copy  of this report including all images has been saved and backed up to a second source for retrieval if needed. All measures and details of the anatomical scan, placentation, fluid volume and pelvic anatomy are contained in that report. Melanie Russo 07/22/2020 10:57 AM Clinical Impression and recommendations: I have reviewed the sonogram results above, combined with the patient's current clinical course, below are my impressions and any appropriate recommendations for management based on the sonographic findings. 1.  G2P1001 Estimated Date of Delivery: 08/14/20 by serial sonographic evaluations 2.  Fetal sonographic surveillance findings: a). Normal fluid volume b). Normal antepartum fetal assessment with BPP 8/8 c). Normal fetal Doppler ratios with consistent diastolic flow 69% d). Normal growth percentile with appropriate interval growth 39% 3.  Normal general sonographic findings Recommend continued prenatal evaluations and care based on this sonogram and as clinically indicated from the patient's clinical course. Melanie Russo 07/24/2020 12:05 PM   ECHOCARDIOGRAM COMPLETE  Result Date: 07/25/2020    ECHOCARDIOGRAM REPORT   Patient Name:   Melanie Russo Date of Exam: 07/25/2020 Medical Rec #:  [id number]       Height:       65.0 in Accession #:    [id number]  Weight:       252.2 lb Date of Birth:  May 20, 1996       BSA:          2.183 m Patient Age:    24 years        BP:           124/70 mmHg Patient Gender: F               HR:           88 bpm. Exam Location:  [address] Procedure: 2D Echo, Cardiac Doppler, Color Doppler, Strain Analysis and 3D Echo Indications:    R00.00 Tachycardia  History:        Patient has no prior history of Echocardiogram examinations.                 Sjogren's syndrome.; Arrythmias:Tachycardia. [redacted] week gestation                 of pregnancy. Gestational diabetes. Chronic hypertension.  Sonographer:    Rigoberto Noel, RDCS Referring Phys: [id number] Barry Dienes [facility] Psychiatric IMPRESSIONS  1.  Left ventricular ejection fraction by 3D volume is 63 %. The left ventricle has normal function. The left ventricle has no regional wall motion abnormalities. Left ventricular diastolic parameters were normal. The average left ventricular global longitudinal strain is -21.2 %. The global longitudinal strain is normal.  2. Right ventricular systolic function is normal. The right ventricular size is normal. Tricuspid regurgitation signal is inadequate for assessing PA pressure.  3. The mitral valve is normal in structure. No evidence of mitral valve regurgitation. No evidence of mitral stenosis.  4. The aortic valve is grossly normal. Aortic valve regurgitation is not visualized. No aortic stenosis is present. FINDINGS  Left Ventricle: Left ventricular ejection fraction by 3D volume is 63 %. The left ventricle has normal function. The left ventricle has no regional wall motion abnormalities. The average left ventricular global longitudinal strain is -21.2 %. The global  longitudinal strain is normal. The left ventricular internal cavity size was normal in size. There is no left ventricular hypertrophy. Left ventricular diastolic parameters were normal. Right Ventricle: The right ventricular size is normal. No increase in right ventricular wall thickness. Right ventricular systolic function is normal. Tricuspid regurgitation signal is inadequate for assessing PA pressure. Left Atrium: Left atrial size was normal in size. Right Atrium: Right atrial size was normal in size. Pericardium: There is no evidence of pericardial effusion. Mitral Valve: The mitral valve is normal in structure. No evidence of mitral valve regurgitation. No evidence of mitral valve stenosis. Tricuspid Valve: The tricuspid valve is normal in structure. Tricuspid valve regurgitation is not demonstrated. No evidence of tricuspid stenosis. Aortic Valve: The aortic valve is grossly normal. Aortic valve regurgitation is not visualized. No aortic  stenosis is present. Pulmonic Valve: The pulmonic valve was normal in structure. Pulmonic valve regurgitation is not visualized. No evidence of pulmonic stenosis. Aorta: The aortic root is normal in size and structure. Venous: The inferior vena cava was not well visualized. IAS/Shunts: No atrial level shunt detected by color flow Doppler.  LEFT VENTRICLE PLAX 2D LVIDd:         4.50 cm         Diastology LVIDs:         3.00 cm         LV e' medial:    4.46 cm/s LV PW:         1.00 cm  LV E/e' medial:  22.1 LV IVS:        0.80 cm         LV e' lateral:   7.72 cm/s LVOT diam:     2.20 cm         LV E/e' lateral: 12.8 LV SV:         64 LV SV Index:   29              2D LVOT Area:     3.80 cm        Longitudinal                                Strain                                2D Strain GLS  -22.0 %                                (A2C):                                2D Strain GLS  -20.3 %                                (A3C):                                2D Strain GLS  -21.2 %                                (A4C):                                2D Strain GLS  -21.2 %                                Avg:                                 3D Volume EF                                LV 3D EF:    Left                                             ventricular                                             ejection  fraction by                                             3D volume                                             is 63 %.                                 3D Volume EF:                                3D EF:        63 %                                LV EDV:       123 ml                                LV ESV:       45 ml                                LV SV:        78 ml RIGHT VENTRICLE RV Basal diam:  4.20 cm RV Mid diam:    4.00 cm RV S prime:     15.60 cm/s TAPSE (M-mode): 2.5 cm LEFT ATRIUM             Index LA diam:        3.50 cm 1.60 cm/m LA Vol (A2C):   54.2 ml  24.83 ml/m LA Vol (A4C):   53.6 ml 24.55 ml/m LA Biplane Vol: 54.6 ml 25.01 ml/m  AORTIC VALVE LVOT Vmax:   101.00 cm/s LVOT Vmean:  70.600 cm/s LVOT VTI:    0.169 m  AORTA Ao Root diam: 2.70 cm Ao Asc diam:  3.00 cm MITRAL VALVE MV Area (PHT):             SHUNTS MV Decel Time: 187 msec    Systemic VTI:  0.17 m MV E velocity: 98.50 cm/s  Systemic Diam: 2.20 cm MV A velocity: 85.30 cm/s MV E/A ratio:  1.15 Weston Brass MD Electronically signed by Weston Brass MD Signature Date/Time: 07/25/2020/3:24:22 PM    Final    VAS [place] LOWER EXTREMITY VENOUS (DVT)  Result Date: 07/31/2020  Lower Venous DVT Study Indications: Swelling, and Cold foot post partum.  Comparison Study: No prior study on file Performing Technologist: Sherren Kerns RVS  Examination Guidelines: A complete evaluation includes B-mode imaging, spectral Doppler, color Doppler, and power Doppler as needed of all accessible portions of each vessel. Bilateral testing is considered an integral part of a complete examination. Limited examinations for reoccurring indications may be performed as noted. The reflux portion of the exam is performed with the patient in reverse Trendelenburg.  +---------+---------------+---------+-----------+----------+--------------+ RIGHT    CompressibilityPhasicitySpontaneityPropertiesThrombus Aging +---------+---------------+---------+-----------+----------+--------------+ CFV      Full  Yes      Yes                                 +---------+---------------+---------+-----------+----------+--------------+ SFJ      Full                                                        +---------+---------------+---------+-----------+----------+--------------+ FV Prox  Full                                                        +---------+---------------+---------+-----------+----------+--------------+ FV Mid   Full                                                         +---------+---------------+---------+-----------+----------+--------------+ FV DistalFull                                                        +---------+---------------+---------+-----------+----------+--------------+ PFV      Full                                                        +---------+---------------+---------+-----------+----------+--------------+ POP      Full           Yes      Yes                                 +---------+---------------+---------+-----------+----------+--------------+ PTV      Full                                                        +---------+---------------+---------+-----------+----------+--------------+ PERO     Full                                                        +---------+---------------+---------+-----------+----------+--------------+   +----+---------------+---------+-----------+----------+--------------+ LEFTCompressibilityPhasicitySpontaneityPropertiesThrombus Aging +----+---------------+---------+-----------+----------+--------------+ CFV Full           Yes      Yes                                 +----+---------------+---------+-----------+----------+--------------+     Summary: RIGHT: - There is no evidence of deep vein  thrombosis in the lower extremity.  LEFT: - No evidence of common femoral vein obstruction.  *See table(s) above for measurements and observations. Electronically signed by Sherald Hess MD on 07/31/2020 at 7:54:57 PM.    Final       Assessment: Multiparous female desires permanent sterilization, opts for bilateral salpingectomy  Plan: Laparoscopic bilateral salpingectomy  Lazaro Arms 08/15/2020 10:13 AM

## 2020-08-16 LAB — HEPATIC FUNCTION PANEL
ALT: 16 IU/L (ref 0–32)
AST: 12 IU/L (ref 0–40)
Albumin: 4.5 g/dL (ref 3.9–5.0)
Alkaline Phosphatase: 157 IU/L — ABNORMAL HIGH (ref 44–121)
Bilirubin Total: 0.4 mg/dL (ref 0.0–1.2)
Bilirubin, Direct: 0.11 mg/dL (ref 0.00–0.40)
Total Protein: 7.3 g/dL (ref 6.0–8.5)

## 2020-08-27 ENCOUNTER — Encounter: Payer: Self-pay | Admitting: *Deleted

## 2020-08-28 DIAGNOSIS — F411 Generalized anxiety disorder: Secondary | ICD-10-CM | POA: Diagnosis not present

## 2020-09-02 ENCOUNTER — Ambulatory Visit: Payer: Medicaid Other | Admitting: Women's Health

## 2020-09-04 ENCOUNTER — Telehealth (INDEPENDENT_AMBULATORY_CARE_PROVIDER_SITE_OTHER): Payer: Medicaid Other | Admitting: Advanced Practice Midwife

## 2020-09-04 DIAGNOSIS — Z98891 History of uterine scar from previous surgery: Secondary | ICD-10-CM

## 2020-09-04 NOTE — Progress Notes (Signed)
I connected with@ on 09/04/20 at  9:10 AM EST by: phone and verified that I am speaking with the correct person using two identifiers.  Patient is located at home and provider is located at [facility].     The purpose of this virtual visit is to provide medical care while limiting exposure to the novel coronavirus. I discussed the limitations, risks, security and privacy concerns of performing an evaluation and management service by FT and the availability of in person appointments. I also discussed with the patient that there may be a patient responsible charge related to this service. By engaging in this virtual visit, you consent to the provision of healthcare.  Additionally, you authorize for your insurance to be billed for the services provided during this visit.  The patient expressed understanding and agreed to proceed.  The following staff members participated in the virtual visit:  Amanda rash  Post Partum Visit Note Subjective:   Melanie Russo is a 25 y.o. [phone] female being evaluated for postpartum followup.  She is 6 weeks postpartum following a primary cesarean section at  37.4 gestational weeks. (STAT for abruption/brady) I have fully reviewed the prenatal and intrapartum course; pregnancy complicated by nothing.  Postpartum course has been uneventful. Baby is doing well. Baby is feeding by just switched to Alomentum from BF. Bleeding no bleeding. Bowel function is normal. Bladder function is normal. Patient is not sexually active. Contraception method is condoms. Postpartum depression screening: negative.    Review of Systems Pertinent items are noted in HPI.   Objective:  There were no vitals filed for this visit. Self-Obtained       Assessment:    normal postpartum exam.  Plan:  Essential components of care per ACOG recommendations:  1.  Mood and well being: Patient with negative depression screening today. Reviewed local resources for support.  - Patient does not use  tobacco. If using tobacco we discussed reduction and for recently cessation risk of relapse - hx of drug use? No   If yes, discussed support systems  2. Infant care and feeding:  -Patient currently breastmilk feeding? No If breastmilk feeding discussed return to work and pumping. If needed, patient was provided letter for work to allow for every 2-3 hr pumping breaks, and to be granted a private location to express breastmilk and refrigerated area to store breastmilk. Reviewed importance of draining breast regularly to support lactation. -Social determinants of health (SDOH) reviewed in EPIC. No concernsThe following needs were identified  3. Sexuality, contraception and birth spacing - Patient does not want a pregnancy in the next year.  Desired family size is 2 children.  - Reviewed forms of contraception in tiered fashion. Patient desired BTL/salpingectomy (schedule for early March)--condoms in the meantime   4. Sleep and fatigue -Encouraged family/partner/community support of 4 hrs of uninterrupted sleep to help with mood and fatigue  5. Physical Recovery  - Discussed patients deliveryand complications - Patient had a 0 degree laceration, perineal healing reviewed. Patient expressed understanding  - Patient has urinary incontinence? No Patient was referred to pelvic floor PT  - Patient is safe to resume physical and sexual activity  6.  Health Maintenance - Last pap smear done 4-19 at [facility] and was normal with negative HPV.   7. GDM:  Schedule 2hr GTT  - PCP follow up  15 minutes of non-face-to-face time spent with the patient    Jacklyn Shell, CNM [facility] [employer], [facility]

## 2020-09-11 DIAGNOSIS — F411 Generalized anxiety disorder: Secondary | ICD-10-CM | POA: Diagnosis not present

## 2020-09-12 ENCOUNTER — Other Ambulatory Visit: Payer: Medicaid Other

## 2020-09-12 DIAGNOSIS — Z8632 Personal history of gestational diabetes: Secondary | ICD-10-CM

## 2020-09-25 DIAGNOSIS — F411 Generalized anxiety disorder: Secondary | ICD-10-CM | POA: Diagnosis not present

## 2020-09-26 ENCOUNTER — Other Ambulatory Visit: Payer: Self-pay | Admitting: Obstetrics & Gynecology

## 2020-10-03 NOTE — Patient Instructions (Signed)
Melanie Russo  10/03/2020     @PREFPERIOPPHARMACY @   Your procedure is scheduled on  10/08/2020.   Report to [facility] at  1120  A.M.   Call this number if you have problems the morning of surgery:  [phone]   Remember:  No solid food after midnight 10/07/2020. You may have clear liquids until 0700 am. At 0700 am, drink your carb drink. You may not have anything else to drink once you drink your carb drink.                       Take these medicines the morning of surgery with A SIP OF WATER None  Please change your sheets before you go to bed 10/07/2020.  DO NOT sleep with pets this night.  Shower with CHG the night before and the morning of your procedure. DO NOT put CHG on your face, hair or genitals.  After each shower, dry off with a clean towel, put on clean clothes and brush your teeth.     Do not wear jewelry, make-up or nail polish.  Do not wear lotions, powders, or perfumes, or deodorant.  Do not shave 48 hours prior to surgery.  Men may shave face and neck.  Do not bring valuables to the hospital.  [facility] is not responsible for any belongings or valuables.  Contacts, dentures or bridgework may not be worn into surgery.  Leave your suitcase in the car.  After surgery it may be brought to your room.  For patients admitted to the hospital, discharge time will be determined by your treatment team.  Patients discharged the day of surgery will not be allowed to drive home and must have someone with them for 24 hours.    Special instructions:   DO NOT smoke tobacco or vape the morning of your procedure.  Please read over the following fact sheets that you were given. Coughing and Deep Breathing, Surgical Site Infection Prevention, Anesthesia Post-op Instructions and Care and Recovery After Surgery       Salpingectomy, Care After This sheet gives you information about how to care for yourself after your procedure. Your health care provider may  also give you more specific instructions. If you have problems or questions, contact your health care provider. What can I expect after the procedure? After the procedure, it is common to have:  Pain in your abdomen.  Some light vaginal bleeding (spotting) for a few days.  Tiredness. Your recovery time will vary depending on which method your surgeon used for your surgery. Follow these instructions at home: Incision care  Follow instructions from your health care provider about how to take care of your incisions. Make sure you: ? Wash your hands with soap and water before and after you change your bandage (dressing). If soap and water are not available, use hand sanitizer. ? Change or remove your dressing as told by your health care provider. ? Leave any stitches (sutures), skin glue, or adhesive strips in place. These skin closures may need to stay in place for 2 weeks or longer. If adhesive strip edges start to loosen and curl up, you may trim the loose edges. Do not remove adhesive strips completely unless your health care provider tells you to do that.  Keep your dressing clean and dry.  Check your incision area every day for signs of infection. Check for: ? Redness, swelling, or pain that gets worse. ?  Fluid or blood. ? Warmth. ? Pus or a bad smell.   Activity  Rest as told by your health care provider.  Avoid sitting for a long time without moving. Get up to take short walks every 1-2 hours. This is important to improve blood flow and breathing. Ask for help if you feel weak or unsteady.  Return to your normal activities as told by your health care provider. Ask your health care provider what activities are safe for you.  Do not drive until your health care provider says that it is safe.  Do not lift anything that is heavier than 10 lb (4.5 kg), or the limit that you are told, until your health care provider says that it is safe. This may be 2-6 weeks depending on your  surgery.  Until your health care provider approves: ? Do not douche. ? Do not use tampons. ? Do not have sex. Medicines  Take over-the-counter and prescription medicines only as told by your health care provider.  Ask your health care provider if the medicine prescribed to you: ? Requires you to avoid driving or using heavy machinery. ? Can cause constipation. You may need to take actions to prevent or treat constipation, such as:  Drink enough fluid to keep your urine pale yellow.  Take over-the-counter or prescription medicines.  Eat foods that are high in fiber, such as beans, whole grains, and fresh fruits and vegetables.  Limit foods that are high in fat and processed sugars, such as fried or sweet foods. General instructions  Wear compression stockings as told by your health care provider. These stockings help to prevent blood clots and reduce swelling in your legs.  Do not use any products that contain nicotine or tobacco, such as cigarettes, e-cigarettes, and chewing tobacco. If you need help quitting, ask your health care provider.  Do not take baths, swim, or use a hot tub until your health care provider approves. You may take showers.  Keep all follow-up visits as told by your health care provider. This is important. Contact a health care provider if you have:  Pain when you urinate.  Redness, swelling, or pain around an incision.  Fluid or blood coming from an incision.  Pus or a bad smell coming from an incision.  An incision that feels warm to the touch.  A fever.  Abdominal pain that gets worse or does not get better with medicine.  An incision that starts to break open.  A rash.  Light-headedness.  Nausea and vomiting. Get help right away if you:  Have pain in your chest or leg.  Develop shortness of breath.  Faint.  Have increased or heavy vaginal bleeding, such as soaking a pad in an hour. Summary  After the procedure, it is common to  feel tired, have some pain in your abdomen, and have some light vaginal bleeding for a few days.  Follow instructions from your health care provider about how to take care of your incisions.  Return to your normal activities as told by your health care provider. Ask your health care provider what activities are safe for you.  Do not douche, use tampons, or have sex until your health care provider approves.  Keep all follow-up visits as told by your health care provider. This information is not intended to replace advice given to you by your health care provider. Make sure you discuss any questions you have with your health care provider. Document Revised: 07/17/2018 Document Reviewed: 07/17/2018  Elsevier Patient Education  2021 Elsevier Inc. General Anesthesia, Adult, Care After This sheet gives you information about how to care for yourself after your procedure. Your health care provider may also give you more specific instructions. If you have problems or questions, contact your health care provider. What can I expect after the procedure? After the procedure, the following side effects are common:  Pain or discomfort at the IV site.  Nausea.  Vomiting.  Sore throat.  Trouble concentrating.  Feeling cold or chills.  Feeling weak or tired.  Sleepiness and fatigue.  Soreness and body aches. These side effects can affect parts of the body that were not involved in surgery. Follow these instructions at home: For the time period you were told by your health care provider:  Rest.  Do not participate in activities where you could fall or become injured.  Do not drive or use machinery.  Do not drink alcohol.  Do not take sleeping pills or medicines that cause drowsiness.  Do not make important decisions or sign legal documents.  Do not take care of children on your own.   Eating and drinking  Follow any instructions from your health care provider about eating or drinking  restrictions.  When you feel hungry, start by eating small amounts of foods that are soft and easy to digest (bland), such as toast. Gradually return to your regular diet.  Drink enough fluid to keep your urine pale yellow.  If you vomit, rehydrate by drinking water, juice, or clear broth. General instructions  If you have sleep apnea, surgery and certain medicines can increase your risk for breathing problems. Follow instructions from your health care provider about wearing your sleep device: ? Anytime you are sleeping, including during daytime naps. ? While taking prescription pain medicines, sleeping medicines, or medicines that make you drowsy.  Have a responsible adult stay with you for the time you are told. It is important to have someone help care for you until you are awake and alert.  Return to your normal activities as told by your health care provider. Ask your health care provider what activities are safe for you.  Take over-the-counter and prescription medicines only as told by your health care provider.  If you smoke, do not smoke without supervision.  Keep all follow-up visits as told by your health care provider. This is important. Contact a health care provider if:  You have nausea or vomiting that does not get better with medicine.  You cannot eat or drink without vomiting.  You have pain that does not get better with medicine.  You are unable to pass urine.  You develop a skin rash.  You have a fever.  You have redness around your IV site that gets worse. Get help right away if:  You have difficulty breathing.  You have chest pain.  You have blood in your urine or stool, or you vomit blood. Summary  After the procedure, it is common to have a sore throat or nausea. It is also common to feel tired.  Have a responsible adult stay with you for the time you are told. It is important to have someone help care for you until you are awake and  alert.  When you feel hungry, start by eating small amounts of foods that are soft and easy to digest (bland), such as toast. Gradually return to your regular diet.  Drink enough fluid to keep your urine pale yellow.  Return to your normal  activities as told by your health care provider. Ask your health care provider what activities are safe for you. This information is not intended to replace advice given to you by your health care provider. Make sure you discuss any questions you have with your health care provider. Document Revised: 04/10/2020 Document Reviewed: 11/08/2019 Elsevier Patient Education  2021 [employer].

## 2020-10-06 ENCOUNTER — Other Ambulatory Visit: Payer: Self-pay

## 2020-10-06 ENCOUNTER — Other Ambulatory Visit (HOSPITAL_COMMUNITY)
Admission: RE | Admit: 2020-10-06 | Discharge: 2020-10-06 | Disposition: A | Discharge status: Home / Self Care | Payer: Medicaid Other | Source: Ambulatory Visit | Attending: Obstetrics & Gynecology | Admitting: Obstetrics & Gynecology

## 2020-10-06 ENCOUNTER — Encounter (HOSPITAL_COMMUNITY): Payer: Self-pay

## 2020-10-06 ENCOUNTER — Encounter (HOSPITAL_COMMUNITY)
Admission: RE | Admit: 2020-10-06 | Discharge: 2020-10-06 | Disposition: A | Discharge status: Home / Self Care | Payer: Medicaid Other | Source: Ambulatory Visit | Attending: Obstetrics & Gynecology | Admitting: Obstetrics & Gynecology

## 2020-10-06 DIAGNOSIS — Z01812 Encounter for preprocedural laboratory examination: Secondary | ICD-10-CM | POA: Diagnosis not present

## 2020-10-06 DIAGNOSIS — Z20822 Contact with and (suspected) exposure to covid-19: Secondary | ICD-10-CM | POA: Insufficient documentation

## 2020-10-06 HISTORY — DX: Nausea with vomiting, unspecified: R11.2

## 2020-10-06 HISTORY — DX: Other specified postprocedural states: Z98.890

## 2020-10-06 LAB — URINALYSIS, ROUTINE W REFLEX MICROSCOPIC
Bilirubin Urine: NEGATIVE
Glucose, UA: NEGATIVE mg/dL
Hgb urine dipstick: NEGATIVE
Ketones, ur: NEGATIVE mg/dL
Leukocytes,Ua: NEGATIVE
Nitrite: NEGATIVE
Protein, ur: NEGATIVE mg/dL
Specific Gravity, Urine: 1.015 (ref 1.005–1.030)
pH: 6 (ref 5.0–8.0)

## 2020-10-06 LAB — COMPREHENSIVE METABOLIC PANEL
ALT: 29 U/L (ref 0–44)
AST: 26 U/L (ref 15–41)
Albumin: 3.9 g/dL (ref 3.5–5.0)
Alkaline Phosphatase: 82 U/L (ref 38–126)
Anion gap: 10 (ref 5–15)
BUN: 12 mg/dL (ref 6–20)
CO2: 21 mmol/L — ABNORMAL LOW (ref 22–32)
Calcium: 9 mg/dL (ref 8.9–10.3)
Chloride: 105 mmol/L (ref 98–111)
Creatinine, Ser: 0.68 mg/dL (ref 0.44–1.00)
GFR, Estimated: >60 mL/min (ref >60)
Glucose, Bld: 100 mg/dL — ABNORMAL HIGH (ref 70–99)
Potassium: 4.5 mmol/L (ref 3.5–5.1)
Sodium: 136 mmol/L (ref 135–145)
Total Bilirubin: 0.6 mg/dL (ref 0.3–1.2)
Total Protein: 7.4 g/dL (ref 6.5–8.1)

## 2020-10-06 LAB — RAPID HIV SCREEN (HIV 1/2 AB+AG)
HIV 1/2 Antibodies: NONREACTIVE
HIV-1 P24 Antigen - HIV24: NONREACTIVE

## 2020-10-06 LAB — HCG, QUANTITATIVE, PREGNANCY: hCG, Beta Chain, Quant, S: <1 m[IU]/mL (ref <5)

## 2020-10-07 LAB — SARS CORONAVIRUS 2 (TAT 6-24 HRS): SARS Coronavirus 2: NEGATIVE

## 2020-10-08 ENCOUNTER — Encounter (HOSPITAL_COMMUNITY): Payer: Self-pay | Admitting: Obstetrics & Gynecology

## 2020-10-08 ENCOUNTER — Ambulatory Visit (HOSPITAL_COMMUNITY): Payer: Medicaid Other | Admitting: Anesthesiology

## 2020-10-08 ENCOUNTER — Ambulatory Visit (HOSPITAL_COMMUNITY)
Admission: RE | Admit: 2020-10-08 | Discharge: 2020-10-08 | Disposition: A | Discharge status: Home / Self Care | Payer: Medicaid Other | Attending: Obstetrics & Gynecology | Admitting: Obstetrics & Gynecology

## 2020-10-08 ENCOUNTER — Other Ambulatory Visit: Payer: Self-pay

## 2020-10-08 ENCOUNTER — Encounter (HOSPITAL_COMMUNITY)
Admission: RE | Disposition: A | Discharge status: Home / Self Care | Payer: Self-pay | Source: Home / Self Care | Attending: Obstetrics & Gynecology

## 2020-10-08 DIAGNOSIS — Z880 Allergy status to penicillin: Secondary | ICD-10-CM | POA: Diagnosis not present

## 2020-10-08 DIAGNOSIS — Z302 Encounter for sterilization: Secondary | ICD-10-CM

## 2020-10-08 DIAGNOSIS — Z91040 Latex allergy status: Secondary | ICD-10-CM | POA: Diagnosis not present

## 2020-10-08 DIAGNOSIS — Z8759 Personal history of other complications of pregnancy, childbirth and the puerperium: Secondary | ICD-10-CM | POA: Insufficient documentation

## 2020-10-08 DIAGNOSIS — N7011 Chronic salpingitis: Secondary | ICD-10-CM | POA: Insufficient documentation

## 2020-10-08 HISTORY — PX: LAPAROSCOPIC BILATERAL SALPINGECTOMY: SHX5889

## 2020-10-08 LAB — CBC WITH DIFFERENTIAL/PLATELET
Abs Immature Granulocytes: 0.02 10*3/uL (ref 0.00–0.07)
Basophils Absolute: 0.1 10*3/uL (ref 0.0–0.1)
Basophils Relative: 1 %
Eosinophils Absolute: 0.1 10*3/uL (ref 0.0–0.5)
Eosinophils Relative: 1 %
HCT: 33.9 % — ABNORMAL LOW (ref 36.0–46.0)
Hemoglobin: 10.2 g/dL — ABNORMAL LOW (ref 12.0–15.0)
Immature Granulocytes: 0 %
Lymphocytes Relative: 31 %
Lymphs Abs: 2.1 10*3/uL (ref 0.7–4.0)
MCH: 23.5 pg — ABNORMAL LOW (ref 26.0–34.0)
MCHC: 30.1 g/dL (ref 30.0–36.0)
MCV: 78.1 fL — ABNORMAL LOW (ref 80.0–100.0)
Monocytes Absolute: 0.5 10*3/uL (ref 0.1–1.0)
Monocytes Relative: 8 %
Neutro Abs: 3.9 10*3/uL (ref 1.7–7.7)
Neutrophils Relative %: 59 %
Platelets: 313 10*3/uL (ref 150–400)
RBC: 4.34 MIL/uL (ref 3.87–5.11)
RDW: 16.2 % — ABNORMAL HIGH (ref 11.5–15.5)
WBC: 6.6 10*3/uL (ref 4.0–10.5)
nRBC: 0 % (ref 0.0–0.2)

## 2020-10-08 SURGERY — SALPINGECTOMY, BILATERAL, LAPAROSCOPIC
Anesthesia: General | Site: Abdomen | Laterality: Bilateral

## 2020-10-08 MED ORDER — ONDANSETRON 8 MG PO TBDP: 8.0000 mg | ORAL_TABLET | Freq: Three times a day (TID) | ORAL | 0 refills | Status: DC | PRN

## 2020-10-08 MED ORDER — BUPIVACAINE LIPOSOME 1.3 % IJ SUSP: 20.0000 mL | Freq: Once | INTRAMUSCULAR | Status: DC

## 2020-10-08 MED ORDER — MIDAZOLAM HCL 2 MG/2ML IJ SOLN
INTRAMUSCULAR | Status: DC | PRN
  Administered 2020-10-08: 2 mg via INTRAVENOUS

## 2020-10-08 MED ORDER — CLINDAMYCIN PHOSPHATE 900 MG/50ML IV SOLN
900.0000 mg | INTRAVENOUS | Status: AC
  Administered 2020-10-08: 900 mg via INTRAVENOUS

## 2020-10-08 MED ORDER — LACTATED RINGERS IV SOLN: INTRAVENOUS | Status: DC

## 2020-10-08 MED ORDER — MIDAZOLAM HCL 2 MG/2ML IJ SOLN
INTRAMUSCULAR | Status: AC
  Filled 2020-10-08: qty 2

## 2020-10-08 MED ORDER — HYDROCODONE-ACETAMINOPHEN 5-325 MG PO TABS: 1.0000 | ORAL_TABLET | Freq: Four times a day (QID) | ORAL | 0 refills | Status: DC | PRN

## 2020-10-08 MED ORDER — ORAL CARE MOUTH RINSE: 15.0000 mL | Freq: Once | OROMUCOSAL | Status: AC

## 2020-10-08 MED ORDER — SODIUM CHLORIDE 0.9 % IR SOLN
Status: DC | PRN
  Administered 2020-10-08: 1000 mL

## 2020-10-08 MED ORDER — BUPIVACAINE LIPOSOME 1.3 % IJ SUSP
INTRAMUSCULAR | Status: AC
  Filled 2020-10-08: qty 20

## 2020-10-08 MED ORDER — GENTAMICIN SULFATE 40 MG/ML IJ SOLN
5.0000 mg/kg | INTRAVENOUS | Status: AC
  Administered 2020-10-08: 380 mg via INTRAVENOUS
  Filled 2020-10-08: qty 9.5

## 2020-10-08 MED ORDER — ROCURONIUM BROMIDE 10 MG/ML (PF) SYRINGE
PREFILLED_SYRINGE | INTRAVENOUS | Status: DC | PRN
  Administered 2020-10-08: 50 mg via INTRAVENOUS

## 2020-10-08 MED ORDER — PROMETHAZINE HCL 25 MG/ML IJ SOLN: 6.2500 mg | INTRAMUSCULAR | Status: DC | PRN

## 2020-10-08 MED ORDER — MEPERIDINE HCL 50 MG/ML IJ SOLN: 6.2500 mg | INTRAMUSCULAR | Status: DC | PRN

## 2020-10-08 MED ORDER — KETOROLAC TROMETHAMINE 30 MG/ML IJ SOLN
30.0000 mg | Freq: Once | INTRAMUSCULAR | Status: AC
  Administered 2020-10-08: 30 mg via INTRAVENOUS
  Filled 2020-10-08: qty 1

## 2020-10-08 MED ORDER — LIDOCAINE HCL (CARDIAC) PF 100 MG/5ML IV SOSY
PREFILLED_SYRINGE | INTRAVENOUS | Status: DC | PRN
  Administered 2020-10-08: 100 mg via INTRATRACHEAL

## 2020-10-08 MED ORDER — SUGAMMADEX SODIUM 500 MG/5ML IV SOLN
INTRAVENOUS | Status: DC | PRN
  Administered 2020-10-08: 250 mg via INTRAVENOUS

## 2020-10-08 MED ORDER — FENTANYL CITRATE (PF) 100 MCG/2ML IJ SOLN
INTRAMUSCULAR | Status: DC | PRN
  Administered 2020-10-08: 150 ug via INTRAVENOUS
  Administered 2020-10-08 (×2): 50 ug via INTRAVENOUS

## 2020-10-08 MED ORDER — HYDROMORPHONE HCL 1 MG/ML IJ SOLN: 0.2500 mg | INTRAMUSCULAR | Status: DC | PRN

## 2020-10-08 MED ORDER — DEXAMETHASONE SODIUM PHOSPHATE 10 MG/ML IJ SOLN
INTRAMUSCULAR | Status: DC | PRN
  Administered 2020-10-08: 10 mg via INTRAVENOUS

## 2020-10-08 MED ORDER — PROPOFOL 10 MG/ML IV BOLUS
INTRAVENOUS | Status: AC
  Filled 2020-10-08: qty 20

## 2020-10-08 MED ORDER — BUPIVACAINE LIPOSOME 1.3 % IJ SUSP
INTRAMUSCULAR | Status: DC | PRN
  Administered 2020-10-08: 20 mL

## 2020-10-08 MED ORDER — SODIUM CHLORIDE (PF) 0.9 % IJ SOLN
INTRAMUSCULAR | Status: AC
  Filled 2020-10-08: qty 40

## 2020-10-08 MED ORDER — CHLORHEXIDINE GLUCONATE 0.12 % MT SOLN
15.0000 mL | Freq: Once | OROMUCOSAL | Status: AC
  Administered 2020-10-08: 15 mL via OROMUCOSAL

## 2020-10-08 MED ORDER — ONDANSETRON HCL 4 MG/2ML IJ SOLN
INTRAMUSCULAR | Status: DC | PRN
  Administered 2020-10-08: 4 mg via INTRAVENOUS

## 2020-10-08 MED ORDER — FENTANYL CITRATE (PF) 250 MCG/5ML IJ SOLN
INTRAMUSCULAR | Status: AC
  Filled 2020-10-08: qty 5

## 2020-10-08 MED ORDER — POVIDONE-IODINE 10 % EX SWAB: 2.0000 "application " | Freq: Once | CUTANEOUS | Status: DC

## 2020-10-08 MED ORDER — KETOROLAC TROMETHAMINE 10 MG PO TABS: 10.0000 mg | ORAL_TABLET | Freq: Three times a day (TID) | ORAL | 0 refills | Status: DC | PRN

## 2020-10-08 MED ORDER — PROPOFOL 10 MG/ML IV BOLUS
INTRAVENOUS | Status: DC | PRN
  Administered 2020-10-08: 200 mg via INTRAVENOUS

## 2020-10-08 MED ORDER — METOCLOPRAMIDE HCL 5 MG/ML IJ SOLN
INTRAMUSCULAR | Status: DC | PRN
  Administered 2020-10-08: 10 mg via INTRAVENOUS

## 2020-10-08 MED ORDER — CLINDAMYCIN PHOSPHATE 900 MG/50ML IV SOLN
INTRAVENOUS | Status: AC
  Filled 2020-10-08: qty 50

## 2020-10-08 SURGICAL SUPPLY — 39 items
ADH SKN CLS APL DERMABOND .7 (GAUZE/BANDAGES/DRESSINGS) ×1
APPLIER CLIP 5 13 M/L LIGAMAX5 (MISCELLANEOUS)
APR CLP MED LRG 5 ANG JAW (MISCELLANEOUS)
BAG HAMPER (MISCELLANEOUS) ×2 IMPLANT
BLADE SURG SZ11 CARB STEEL (BLADE) ×2 IMPLANT
CLIP APPLIE 5 13 M/L LIGAMAX5 (MISCELLANEOUS) IMPLANT
CLOTH BEACON ORANGE TIMEOUT ST (SAFETY) ×2 IMPLANT
COVER LIGHT HANDLE STERIS (MISCELLANEOUS) ×4 IMPLANT
COVER WAND RF STERILE (DRAPES) ×2 IMPLANT
DERMABOND ADVANCED (GAUZE/BANDAGES/DRESSINGS) ×1
DERMABOND ADVANCED .7 DNX12 (GAUZE/BANDAGES/DRESSINGS) ×1 IMPLANT
ELECT REM PT RETURN 9FT ADLT (ELECTROSURGICAL) ×2
ELECTRODE REM PT RTRN 9FT ADLT (ELECTROSURGICAL) ×1 IMPLANT
GAUZE 4X4 16PLY RFD (DISPOSABLE) ×2 IMPLANT
GLOVE SRG 8 PF TXTR STRL LF DI (GLOVE) ×1 IMPLANT
GLOVE SURG UNDER POLY LF SZ7 (GLOVE) ×10 IMPLANT
GLOVE SURG UNDER POLY LF SZ8 (GLOVE) ×2
GOWN STRL REUS W/TWL LRG LVL3 (GOWN DISPOSABLE) ×6 IMPLANT
GOWN STRL REUS W/TWL XL LVL3 (GOWN DISPOSABLE) ×2 IMPLANT
INST SET LAPROSCOPIC GYN AP (KITS) ×2 IMPLANT
KIT TURNOVER CYSTO (KITS) ×2 IMPLANT
NEEDLE HYPO 18GX1.5 BLUNT FILL (NEEDLE) ×2 IMPLANT
NEEDLE HYPO 21X1.5 SAFETY (NEEDLE) ×2 IMPLANT
NEEDLE INSUFFLATION 14GA 120MM (NEEDLE) ×2 IMPLANT
PACK PERI GYN (CUSTOM PROCEDURE TRAY) ×2 IMPLANT
PAD ARMBOARD 7.5X6 YLW CONV (MISCELLANEOUS) ×2 IMPLANT
SET BASIN LINEN APH (SET/KITS/TRAYS/PACK) ×2 IMPLANT
SET TUBE SMOKE EVAC HIGH FLOW (TUBING) ×2 IMPLANT
SHEARS HARMONIC ACE PLUS 36CM (ENDOMECHANICALS) ×2 IMPLANT
SLEEVE ENDOPATH XCEL 5M (ENDOMECHANICALS) ×2 IMPLANT
SOL ANTI FOG 6CC (MISCELLANEOUS) ×1 IMPLANT
SOLUTION ANTI FOG 6CC (MISCELLANEOUS) ×1
SUT VICRYL 0 UR6 27IN ABS (SUTURE) ×2 IMPLANT
SUT VICRYL AB 3-0 FS1 BRD 27IN (SUTURE) ×4 IMPLANT
SYR 10ML LL (SYRINGE) ×2 IMPLANT
SYR 20ML LL LF (SYRINGE) ×4 IMPLANT
TROCAR ENDO BLADELESS 11MM (ENDOMECHANICALS) ×2 IMPLANT
TROCAR XCEL NON-BLD 5MMX100MML (ENDOMECHANICALS) ×2 IMPLANT
WARMER LAPAROSCOPE (MISCELLANEOUS) ×2 IMPLANT

## 2020-10-08 NOTE — Progress Notes (Signed)
Pt dressed and d/c from PACU. D/C instructions reviewed with pt. Questions answered. Voiced understanding. D/C to home in good condition.

## 2020-10-08 NOTE — H&P (Signed)
Preoperative History and Physical  Melanie Russo is a 25 y.o. [id number] with No LMP recorded. admitted for a laparoscopic bilateral salpingectomy for permanent sterilization.  She understands various tubal ligation options but selected bilateral salpingectomy  PMH:        Past Medical History:  Diagnosis Date  . Anemia   . Arthritis   . Gestational diabetes   . Hypertension    gestational   . Sjogren's syndrome (HCC)     PSH:          Past Surgical History:  Procedure Laterality Date  . CESAREAN SECTION N/A 07/28/2020   Procedure: CESAREAN SECTION;  Surgeon: Lazaro Arms, MD;  Location: [facility] LD ORS;  Service: Obstetrics;  Laterality: N/A;  . NO PAST SURGERIES      POb/GynH:              OB History    Gravida  2   Para  2   Term  2   Preterm      AB      Living  2     SAB      IAB      Ectopic      Multiple  0   Live Births  2           SH:   Social History        Tobacco Use  . Smoking status: Never Smoker  . Smokeless tobacco: Never Used  Vaping Use  . Vaping Use: Never used  Substance Use Topics  . Alcohol use: Not Currently    Comment: rarely  . Drug use: Never    FH:         Family History  Problem Relation Age of Onset  . Hypertension Father   . Arthritis Father   . Obesity Maternal Uncle   . Cancer Maternal Grandmother        brain  . Heart disease Paternal Grandmother   . Hypertension Paternal Grandmother   . Thyroid disease Paternal Grandmother   . Thyroid disease Paternal Grandfather      Allergies:       Allergies  Allergen Reactions  . Kiwi Extract Anaphylaxis  . Other Anaphylaxis    Blueberries, blackberries, raspberries, nuts- pt reports that it feels like she is being stabbed in the mouth when eating nuts   . Latex   . Penicillins     Medications:       Current Outpatient Medications:  .  acetaminophen (TYLENOL) 325 MG tablet, Take 2 tablets (650 mg total)  by mouth every 4 (four) hours as needed., Disp: 100 tablet, Rfl: 2 .  acetaminophen (TYLENOL) 500 MG tablet, Take 1,000 mg by mouth every 6 (six) hours as needed., Disp: , Rfl:  .  ferrous sulfate 325 (65 FE) MG tablet, Take 1 tablet (325 mg total) by mouth every other day., Disp: 30 tablet, Rfl: 3 .  ibuprofen (ADVIL) 800 MG tablet, Take 1 tablet (800 mg total) by mouth every 6 (six) hours., Disp: 30 tablet, Rfl: 0 .  norethindrone (MICRONOR) 0.35 MG tablet, Take 1 tablet (0.35 mg total) by mouth daily. Take 1 a day, Disp: 28 tablet, Rfl: 11 .  Prenatal Vit-Fe Fumarate-FA (MULTIVITAMIN-PRENATAL) 27-0.8 MG TABS tablet, Take 1 tablet by mouth daily at 12 noon., Disp: , Rfl:   Review of Systems:   Review of Systems  Constitutional: Negative for fever, chills, weight loss, malaise/fatigue and diaphoresis.  HENT: Negative for hearing loss, ear  pain, nosebleeds, congestion, sore throat, neck pain, tinnitus and ear discharge.   Eyes: Negative for blurred vision, double vision, photophobia, pain, discharge and redness.  Respiratory: Negative for cough, hemoptysis, sputum production, shortness of breath, wheezing and stridor.   Cardiovascular: Negative for chest pain, palpitations, orthopnea, claudication, leg swelling and PND.  Gastrointestinal: Positive for abdominal pain. Negative for heartburn, nausea, vomiting, diarrhea, constipation, blood in stool and melena.  Genitourinary: Negative for dysuria, urgency, frequency, hematuria and flank pain.  Musculoskeletal: Negative for myalgias, back pain, joint pain and falls.  Skin: Negative for itching and rash.  Neurological: Negative for dizziness, tingling, tremors, sensory change, speech change, focal weakness, seizures, loss of consciousness, weakness and headaches.  Endo/Heme/Allergies: Negative for environmental allergies and polydipsia. Does not bruise/bleed easily.  Psychiatric/Behavioral: Negative for depression, suicidal ideas, hallucinations,  memory loss and substance abuse. The patient is not nervous/anxious and does not have insomnia.      PHYSICAL EXAM:  Blood pressure 131/89, pulse (!) 111, weight 231 lb (104.8 kg), currently breastfeeding.    Vitals reviewed. Constitutional: She is oriented to person, place, and time. She appears well-developed and well-nourished.  HENT:  Head: Normocephalic and atraumatic.  Right Ear: External ear normal.  Left Ear: External ear normal.  Nose: Nose normal.  Mouth/Throat: Oropharynx is clear and moist.  Eyes: Conjunctivae and EOM are normal. Pupils are equal, round, and reactive to light. Right eye exhibits no discharge. Left eye exhibits no discharge. No scleral icterus.  Neck: Normal range of motion. Neck supple. No tracheal deviation present. No thyromegaly present.  Cardiovascular: Normal rate, regular rhythm, normal heart sounds and intact distal pulses.  Exam reveals no gallop and no friction rub.   No murmur heard. Respiratory: Effort normal and breath sounds normal. No respiratory distress. She has no wheezes. She has no rales. She exhibits no tenderness.  GI: Soft. Bowel sounds are normal. She exhibits no distension and no mass. There is tenderness. There is no rebound and no guarding.  Incision clean dry intact Genitourinary:       Vulva is normal without lesions Vagina is pink moist without discharge Cervix normal in appearance and pap is normal Uterus is normal size, contour, position, consistency, mobility, non-tender Adnexa is negative with normal sized ovaries by sonogram  Musculoskeletal: Normal range of motion. She exhibits no edema and no tenderness.  Neurological: She is alert and oriented to person, place, and time. She has normal reflexes. She displays normal reflexes. No cranial nerve deficit. She exhibits normal muscle tone. Coordination normal.  Skin: Skin is warm and dry. No rash noted. No erythema. No pallor.  Psychiatric: She has a normal mood and  affect. Her behavior is normal. Judgment and thought content normal.    Labs: Results for orders placed or performed during the hospital encounter of 10/08/20 (from the past 48 hour(s))  CBC with Differential/Platelet     Status: Abnormal   Collection Time: 10/08/20 11:59 AM  Result Value Ref Range   WBC 6.6 4.0 - 10.5 K/uL   RBC 4.34 3.87 - 5.11 MIL/uL   Hemoglobin 10.2 (L) 12.0 - 15.0 g/dL   HCT 35.5 (L) 73.2 - 20.2 %   MCV 78.1 (L) 80.0 - 100.0 fL   MCH 23.5 (L) 26.0 - 34.0 pg   MCHC 30.1 30.0 - 36.0 g/dL   RDW 54.2 (H) 70.6 - 23.7 %   Platelets 313 150 - 400 K/uL   nRBC 0.0 0.0 - 0.2 %   Neutrophils Relative %  59 %   Neutro Abs 3.9 1.7 - 7.7 K/uL   Lymphocytes Relative 31 %   Lymphs Abs 2.1 0.7 - 4.0 K/uL   Monocytes Relative 8 %   Monocytes Absolute 0.5 0.1 - 1.0 K/uL   Eosinophils Relative 1 %   Eosinophils Absolute 0.1 0.0 - 0.5 K/uL   Basophils Relative 1 %   Basophils Absolute 0.1 0.0 - 0.1 K/uL   Immature Granulocytes 0 %   Abs Immature Granulocytes 0.02 0.00 - 0.07 K/uL    Comment: Performed at [facility], [address], [place], [place] [address]  .  EKG:    Orders placed or performed during the hospital encounter of 07/31/20  . EKG 12-Lead  . EKG 12-Lead  . EKG    Imaging Studies:  Imaging Results      Assessment: Multiparous female desires permanent sterilization, opts for bilateral salpingectomy  Plan: Laparoscopic bilateral salpingectomy  Lazaro Arms 10/08/2020 12:14 PM

## 2020-10-08 NOTE — Anesthesia Postprocedure Evaluation (Signed)
Anesthesia Post Note  Patient: Melanie Russo  Procedure(s) Performed: LAPAROSCOPIC BILATERAL SALPINGECTOMY (Bilateral Abdomen)  Patient location during evaluation: PACU Anesthesia Type: General Level of consciousness: awake and oriented Pain management: pain level controlled Vital Signs Assessment: post-procedure vital signs reviewed and stable Respiratory status: spontaneous breathing and respiratory function stable Cardiovascular status: blood pressure returned to baseline and stable Postop Assessment: no apparent nausea or vomiting Anesthetic complications: no   No complications documented.   Last Vitals:  Vitals:   10/08/20 1415 10/08/20 1430  BP: 128/64 137/75  Pulse:    Resp: 14 18  Temp:    SpO2:  99%    Last Pain:  Vitals:   10/08/20 1430  PainSc: 2                  Lorin Glass

## 2020-10-08 NOTE — Discharge Instructions (Signed)
Laparoscopic Tubal Ligation, Care After The following information offers guidance on how to care for yourself after your procedure. Your health care provider may also give you more specific instructions. If you have problems or questions, contact your health care provider. What can I expect after the procedure? After the procedure, it is common to have:  A sore throat if general anesthesia was used.  Pain in shoulders, back, and abdomen. This is caused by the gas that was used during the procedure.  Mild discomfort or cramping in your abdomen.  Pain or soreness in the area where the surgical incision was made.  A bloated feeling.  Tiredness.  Nausea and vomiting. Follow these instructions at home: Medicines  Take over-the-counter and prescription medicines only as told by your health care provider.  Ask your health care provider if the medicine prescribed to you: ? Requires you to avoid driving or using heavy machinery. ? Can cause constipation. You may need to take these actions to prevent or treat constipation:  Drink enough fluid to keep your urine pale yellow.  Take over-the-counter or prescription medicines.  Eat foods that are high in fiber, such as beans, whole grains, and fresh fruits and vegetables.  Limit foods that are high in fat and processed sugars, such as fried or sweet foods.  Do not take aspirin because it can cause bleeding. Incision care  Follow instructions from your health care provider about how to take care of your incision. Make sure you: ? Wash your hands with soap and water for at least 20 seconds before and after you change your bandage (dressing). If soap and water are not available, use hand sanitizer. ? Change your dressing as told by your health care provider. ? Leave stitches (sutures), skin glue, or adhesive strips in place. These skin closures may need to stay in place for 2 weeks or longer. If adhesive strip edges start to loosen and curl  up, you may trim the loose edges. Do not remove adhesive strips completely unless your health care provider tells you to do that.  Check your incision area every day for signs of infection. Check for: ? Redness, swelling, or more pain. ? Fluid or blood. ? Warmth. ? Pus or a bad smell.      Activity  Rest as told by your health care provider.  Avoid sitting for a long time without moving. Get up to take short walks every 1-2 hours. This is important to improve blood flow and breathing. Ask for help if you feel weak or unsteady.  Do not have sex, douche, or put a tampon or anything else in your vagina for 6 weeks or as long as told by your health care provider.  Do not lift anything that is heavier than your baby for 2 weeks, or the limit that you are told, until your health care provider says that it is safe.  Do not take baths, swim, or use a hot tub until your health care provider approves. Ask your health care provider if you may take showers. You may only be allowed to take sponge baths.  Return to your normal activities as told by your health care provider. Ask your health care provider what activities are safe for you. General instructions  After the procedure you may need to wear a sanitary pad for vaginal discharge.  Have someone help you with your daily household tasks for the first few days.  Keep all follow-up visits. This is important. Contact a health   care provider if:  You have redness, swelling, or more pain around your incision.  Your incision feels warm to the touch.  You have pus or a bad smell coming from your incision.  The edges of your incision break open after the sutures have been removed.  Your pain does not improve after 2-3 days.  You have a rash.  You repeatedly become dizzy or light-headed.  Your pain medicine is not helping. Get help right away if:  You have a fever or chills.  You faint.  You have increasing pain in your  abdomen.  You have severe pain in one or both of your shoulders.  You have fluid or blood coming from your sutures or heavy bleeding from your vagina.  You have shortness of breath or difficulty breathing.  You have chest pain, leg pain, or leg swelling.  You have ongoing nausea, vomiting, or diarrhea. These symptoms may represent a serious problem that is an emergency. Do not wait to see if the symptoms will go away. Get medical help right away. Call your local emergency services (911 in the [place] Do not drive yourself to the hospital. Summary  After the procedure, it is common to have mild discomfort or cramping in your abdomen.  After the procedure you may need to wear a sanitary pad for vaginal discharge.  Take over-the-counter and prescription medicines only as told by your health care provider.  Watch for symptoms that should prompt you to call your health care provider.  Keep all follow-up visits. This is important. This information is not intended to replace advice given to you by your health care provider. Make sure you discuss any questions you have with your health care provider. Document Revised: 04/11/2020 Document Reviewed: 04/11/2020 Elsevier Patient Education  2021 [employer]  

## 2020-10-08 NOTE — Transfer of Care (Signed)
Immediate Anesthesia Transfer of Care Note  Patient: Karsynn Deweese  Procedure(s) Performed: LAPAROSCOPIC BILATERAL SALPINGECTOMY (Bilateral Abdomen)  Patient Location: PACU  Anesthesia Type:General  Level of Consciousness: awake, alert  and oriented  Airway & Oxygen Therapy: Patient Spontanous Breathing and Patient connected to nasal cannula oxygen  Post-op Assessment: Report given to RN and Post -op Vital signs reviewed and stable  Post vital signs: Reviewed and stable  Last Vitals:  Vitals Value Taken Time  BP 132/68   Temp    Pulse 108   Resp 10   SpO2 97%     Last Pain: There were no vitals filed for this visit.       Complications: No complications documented.

## 2020-10-08 NOTE — Anesthesia Preprocedure Evaluation (Addendum)
Anesthesia Evaluation  Patient identified by MRN, date of birth, ID band Patient awake    Reviewed: Allergy & Precautions, NPO status , Patient's Chart, lab work & pertinent test results  History of Anesthesia Complications (+) PONV and history of anesthetic complications (nausea and vomiting during labor )  Airway Mallampati: II  TM Distance: >3 FB Neck ROM: Full    Dental  (+) Dental Advisory Given, Chipped,    Pulmonary neg pulmonary ROS,    Pulmonary exam normal breath sounds clear to auscultation       Cardiovascular Exercise Tolerance: Good hypertension (not on meds), Normal cardiovascular exam Rhythm:Regular Rate:Normal     Neuro/Psych PSYCHIATRIC DISORDERS Anxiety Depression negative neurological ROS     GI/Hepatic negative GI ROS, Neg liver ROS,   Endo/Other  diabetes (not on meds), Well Controlled, Type 2  Renal/GU negative Renal ROS     Musculoskeletal  (+) Arthritis ,   Abdominal   Peds  Hematology  (+) anemia ,   Anesthesia Other Findings   Reproductive/Obstetrics                            Anesthesia Physical Anesthesia Plan  ASA: II  Anesthesia Plan: General   Post-op Pain Management:    Induction: Intravenous  PONV Risk Score and Plan: 4 or greater and Ondansetron, Dexamethasone, Midazolam and Metaclopromide  Airway Management Planned: Oral ETT  Additional Equipment:   Intra-op Plan:   Post-operative Plan: Extubation in OR  Informed Consent: I have reviewed the patients History and Physical, chart, labs and discussed the procedure including the risks, benefits and alternatives for the proposed anesthesia with the patient or authorized representative who has indicated his/her understanding and acceptance.     Dental advisory given  Plan Discussed with: CRNA and Surgeon  Anesthesia Plan Comments:         Anesthesia Quick Evaluation

## 2020-10-08 NOTE — Anesthesia Procedure Notes (Signed)
Procedure Name: Intubation Date/Time: 10/08/2020 12:47 PM Performed by: Karna Dupes, CRNA Pre-anesthesia Checklist: Patient identified, Emergency Drugs available, Suction available and Patient being monitored Patient Re-evaluated:Patient Re-evaluated prior to induction Oxygen Delivery Method: Circle system utilized Preoxygenation: Pre-oxygenation with 100% oxygen Induction Type: IV induction Ventilation: Mask ventilation without difficulty Laryngoscope Size: Mac and 3 Grade View: Grade I Tube type: Oral Number of attempts: 1 Airway Equipment and Method: Stylet Placement Confirmation: ETT inserted through vocal cords under direct vision,  positive ETCO2 and breath sounds checked- equal and bilateral Secured at: 21 cm Tube secured with: Tape Dental Injury: Teeth and Oropharynx as per pre-operative assessment

## 2020-10-08 NOTE — Op Note (Signed)
Preoperative Diagnosis:  1.  Multiparous female desires permanent sterilization                                          2.  Elects to have bilateral salpingectomy for ovarian cancer prophylaxis  Postoperative Diagnosis:  Same as above  Procedure:  Laparoscopic Bilateral Salpingectomy for the purpose of permanent sterilization  Surgeon:  Rockne Coons MD  Anaesthesia: general  Findings:  Patient had normal pelvic anatomy and no intraperitoneal abnormalities.  Description of Operation:  Patient was taken to the OR and placed into supine position where she underwent general anaesthesia.   She was placed in the dorsal lithotomy position and prepped and draped in the usual sterile fashion.   An incision was made in the umbilicus and dissection taken down to the rectus fascia. A Veres needle was used to insufflate the periotneal cavity. An 11 mm non bladed video laparoscope trocar was then placed under direct visualization without difficulty.   The above noted findings were observed.   Two additional 5 mm non bladed trocars were placed in the right and left lower quadrants under direct visualization without difficulty.   The Harmonic scalpel was employed and a salpingectomy of both the right and left tubes was performed.   The tubes were removed from the peritoneal cavity and sent to pathology.   There was good hemostasis bilaterally.   The fascia, peritoneum and subcutaneous tissue were closed using 0 vicryl.   All 3 skin incisions were closed using 3-0 vicryl in a subcuticular fashion.  Exparel 266 mg 20 cc was injected in the 3 incisional/trocar sites.  The patient was awakened from anaesthesia and taken to the PACU with all counts being correct x 3.   The patient received  Gentamicin and clindamycin of ancef andToradol 30 mg IV preoperatively.  Lazaro Arms 10/08/2020 1:47 PM

## 2020-10-09 ENCOUNTER — Encounter (HOSPITAL_COMMUNITY): Payer: Self-pay | Admitting: Obstetrics & Gynecology

## 2020-10-10 DIAGNOSIS — F411 Generalized anxiety disorder: Secondary | ICD-10-CM | POA: Diagnosis not present

## 2020-10-10 LAB — SURGICAL PATHOLOGY

## 2020-10-17 ENCOUNTER — Encounter: Payer: Medicaid Other | Admitting: Obstetrics & Gynecology

## 2020-10-28 ENCOUNTER — Encounter: Payer: Self-pay | Admitting: Obstetrics & Gynecology

## 2020-10-28 ENCOUNTER — Telehealth (INDEPENDENT_AMBULATORY_CARE_PROVIDER_SITE_OTHER): Payer: Medicaid Other | Admitting: Obstetrics & Gynecology

## 2020-10-28 DIAGNOSIS — Z9889 Other specified postprocedural states: Secondary | ICD-10-CM

## 2020-10-28 NOTE — Progress Notes (Signed)
   MyChart video visit Patient is at home I am in my office Via MyChart Total time spent: 10 minutes  HPI: Patient returns for routine postoperative follow-up having undergone laparoscopic bilateral salpingectomy for sterilization on 10/08/20.  The patient's immediate postoperative recovery has been unremarkable. Since hospital discharge the patient reports no problems.   Current Outpatient Medications: acetaminophen (TYLENOL) 325 MG tablet, Take 2 tablets (650 mg total) by mouth every 4 (four) hours as needed., Disp: 100 tablet, Rfl: 2 ferrous sulfate 325 (65 FE) MG tablet, Take 1 tablet (325 mg total) by mouth every other day., Disp: 30 tablet, Rfl: 3 HYDROcodone-acetaminophen (NORCO/VICODIN) 5-325 MG tablet, Take 1 tablet by mouth every 6 (six) hours as needed., Disp: 15 tablet, Rfl: 0 ibuprofen (ADVIL) 800 MG tablet, Take 1 tablet (800 mg total) by mouth every 6 (six) hours., Disp: 30 tablet, Rfl: 0 ketorolac (TORADOL) 10 MG tablet, Take 1 tablet (10 mg total) by mouth every 8 (eight) hours as needed., Disp: 15 tablet, Rfl: 0 ondansetron (ZOFRAN ODT) 8 MG disintegrating tablet, Take 1 tablet (8 mg total) by mouth every 8 (eight) hours as needed for nausea or vomiting., Disp: 8 tablet, Rfl: 0  No current facility-administered medications for this visit.    currently breastfeeding.  Physical Exam: Via video  All 3 incisions look good  Diagnostic Tests:   Pathology: benign  Impression:    ICD-10-CM   1. Post-operative state, s/p laparoscopic bilateral salpingectomy, 10/08/20  Z98.890      Plan:     Follow up: prn   Lazaro Arms, MD

## 2020-11-07 DIAGNOSIS — F411 Generalized anxiety disorder: Secondary | ICD-10-CM | POA: Diagnosis not present

## 2020-12-12 DIAGNOSIS — F411 Generalized anxiety disorder: Secondary | ICD-10-CM | POA: Diagnosis not present

## 2020-12-26 DIAGNOSIS — F411 Generalized anxiety disorder: Secondary | ICD-10-CM | POA: Diagnosis not present

## 2021-01-30 ENCOUNTER — Ambulatory Visit: Payer: Self-pay | Admitting: Nurse Practitioner

## 2021-02-07 DIAGNOSIS — F411 Generalized anxiety disorder: Secondary | ICD-10-CM | POA: Diagnosis not present

## 2021-02-21 DIAGNOSIS — F411 Generalized anxiety disorder: Secondary | ICD-10-CM | POA: Diagnosis not present

## 2021-08-26 ENCOUNTER — Other Ambulatory Visit: Payer: Medicaid Other | Admitting: Women's Health

## 2021-08-27 ENCOUNTER — Other Ambulatory Visit: Payer: Medicaid Other | Admitting: Adult Health

## 2021-09-17 ENCOUNTER — Other Ambulatory Visit: Payer: Self-pay

## 2021-09-17 ENCOUNTER — Other Ambulatory Visit (HOSPITAL_COMMUNITY)
Admission: RE | Admit: 2021-09-17 | Discharge: 2021-09-17 | Disposition: A | Discharge status: Home / Self Care | Payer: BC Managed Care – PPO | Source: Ambulatory Visit | Attending: Women's Health | Admitting: Women's Health

## 2021-09-17 ENCOUNTER — Encounter: Payer: Self-pay | Admitting: Advanced Practice Midwife

## 2021-09-17 ENCOUNTER — Ambulatory Visit (INDEPENDENT_AMBULATORY_CARE_PROVIDER_SITE_OTHER): Payer: BC Managed Care – PPO | Admitting: Advanced Practice Midwife

## 2021-09-17 VITALS — BP 129/80 | HR 108 | Ht 65.0 in | Wt 262.5 lb

## 2021-09-17 DIAGNOSIS — Z01419 Encounter for gynecological examination (general) (routine) without abnormal findings: Secondary | ICD-10-CM

## 2021-09-17 DIAGNOSIS — Z0000 Encounter for general adult medical examination without abnormal findings: Secondary | ICD-10-CM | POA: Insufficient documentation

## 2021-09-17 NOTE — Progress Notes (Signed)
Melanie Russo 26 y.o.  Vitals:   09/17/21 1059  BP: 129/80  Pulse: (!) 108     Filed Weights   09/17/21 1059  Weight: 262 lb 8 oz (119.1 kg)    Past Medical History: Past Medical History:  Diagnosis Date   Anemia    Arthritis    Gestational diabetes    Hypertension    gestational    PONV (postoperative nausea and vomiting)    Sjogren's syndrome (HCC)     Past Surgical History: Past Surgical History:  Procedure Laterality Date   CESAREAN SECTION N/A 07/28/2020   Procedure: CESAREAN SECTION;  Surgeon: Lazaro Arms, MD;  Location: [facility] ORS;  Service: Obstetrics;  Laterality: N/A;   LAPAROSCOPIC BILATERAL SALPINGECTOMY Bilateral 10/08/2020   Procedure: LAPAROSCOPIC BILATERAL SALPINGECTOMY;  Surgeon: Lazaro Arms, MD;  Location: [facility] ORS;  Service: Gynecology;  Laterality: Bilateral;   NO PAST SURGERIES      Family History: Family History  Problem Relation Age of Onset   Thyroid disease Paternal Grandfather    Heart disease Paternal Grandmother    Hypertension Paternal Grandmother    Thyroid disease Paternal Grandmother    Cancer Maternal Grandmother        brain   Hypertension Father    Arthritis Father    Autism Son    Obesity Maternal Uncle     Social History: Social History   Tobacco Use   Smoking status: Never   Smokeless tobacco: Never  Vaping Use   Vaping Use: Never used  Substance Use Topics   Alcohol use: Not Currently    Comment: rarely   Drug use: Never    Allergies:  Allergies  Allergen Reactions   Kiwi Extract Anaphylaxis   Other Anaphylaxis    Blueberries, blackberries, raspberries, nuts- pt reports that it feels like she is being stabbed in the mouth when eating nuts    Latex Hives    Burning   Penicillins Hives    Reaction: 2016      Current Outpatient Medications:    Acetaminophen (TYLENOL PO), Take by mouth as needed., Disp: , Rfl:   History of Present Illness: Here for pap/physical. Last pap 2019, normal.  Has  Bilateral salpingectomy last March.  Periods are still irregular as always, but now they are closer together instead of further apart. Offered hormonal fix, not really interested. .    Review of Systems   Patient denies any headaches, blurred vision, shortness of breath, chest pain, abdominal pain, problems with bowel movements, urination, or intercourse.   Physical Exam: General:  Well developed, well nourished, no acute distress Skin:  Warm and dry Neck:  Midline trachea, normal thyroid Lungs; Clear to auscultation bilaterally Breast:  No dominant palpable mass, retraction, or nipple discharge Cardiovascular: Regular rate and rhythm Abdomen:  Soft, non tender, no hepatosplenomegaly Pelvic:  External genitalia is normal in appearance.  The vagina is normal in appearance.  The cervix is bulbous.  Uterus is felt to be normal size, shape, and contour.  No adnexal masses or tenderness noted. Exam limited by habitus Extremities:  No swelling or varicosities noted Psych:  No mood changes.     Impression: Normal GYN exam     Plan: If pap normal, repeat in 3 years.

## 2021-09-18 LAB — CYTOLOGY - PAP
Adequacy: ABSENT
Comment: NEGATIVE
Diagnosis: NEGATIVE
High risk HPV: NEGATIVE

## 2021-11-11 DIAGNOSIS — M533 Sacrococcygeal disorders, not elsewhere classified: Secondary | ICD-10-CM | POA: Diagnosis not present

## 2021-11-11 DIAGNOSIS — M545 Low back pain, unspecified: Secondary | ICD-10-CM | POA: Diagnosis not present

## 2021-11-11 DIAGNOSIS — Z91040 Latex allergy status: Secondary | ICD-10-CM | POA: Diagnosis not present

## 2021-11-11 DIAGNOSIS — M5442 Lumbago with sciatica, left side: Secondary | ICD-10-CM | POA: Diagnosis not present

## 2021-11-11 DIAGNOSIS — Z880 Allergy status to penicillin: Secondary | ICD-10-CM | POA: Diagnosis not present

## 2021-11-12 ENCOUNTER — Telehealth: Payer: Self-pay

## 2021-11-12 NOTE — Telephone Encounter (Signed)
Transition Care Management Follow-up Telephone Call ?Date of discharge and from where: 11/11/2021-[facility]  ?How have you been since you were released from the hospital? Pt stated she has not been to [facility] ED in a while. Pt is ok, she is at work.  ?Any questions or concerns? No ? ?Items Reviewed: ?Did the pt receive and understand the discharge instructions provided? Yes  ?Medications obtained and verified? Yes  ?Other? No  ?Any new allergies since your discharge? No  ?Dietary orders reviewed? No ?Do you have support at home? Yes  ? ?Home Care and Equipment/Supplies: ?Were home health services ordered? not applicable ?If so, what is the name of the agency? N/A  ?Has the agency set up a time to come to the patient's home? not applicable ?Were any new equipment or medical supplies ordered?  No ?What is the name of the medical supply agency? N/A ?Were you able to get the supplies/equipment? not applicable ?Do you have any questions related to the use of the equipment or supplies? No ? ?Functional Questionnaire: (I = Independent and D = Dependent) ?ADLs: I ? ?Bathing/Dressing- I ? ?Meal Prep- I ? ?Eating- I ? ?Maintaining continence- I ? ?Transferring/Ambulation- I ? ?Managing Meds- I ? ?Follow up appointments reviewed: ? ?PCP Hospital f/u appt confirmed? No   ?Specialist Hospital f/u appt confirmed? No   ?Are transportation arrangements needed? No  ?If their condition worsens, is the pt aware to call PCP or go to the Emergency Dept.? Yes ?Was the patient provided with contact information for the PCP's office or ED? Yes ?Was to pt encouraged to call back with questions or concerns? Yes  ?

## 2022-03-27 IMAGING — DX DG CHEST 1V
1 series · 1 of 1 positions shown · non-contrast
Comparison: None.

CLINICAL DATA: Chest pain

EXAM:
CHEST  1 VIEW

[chest]
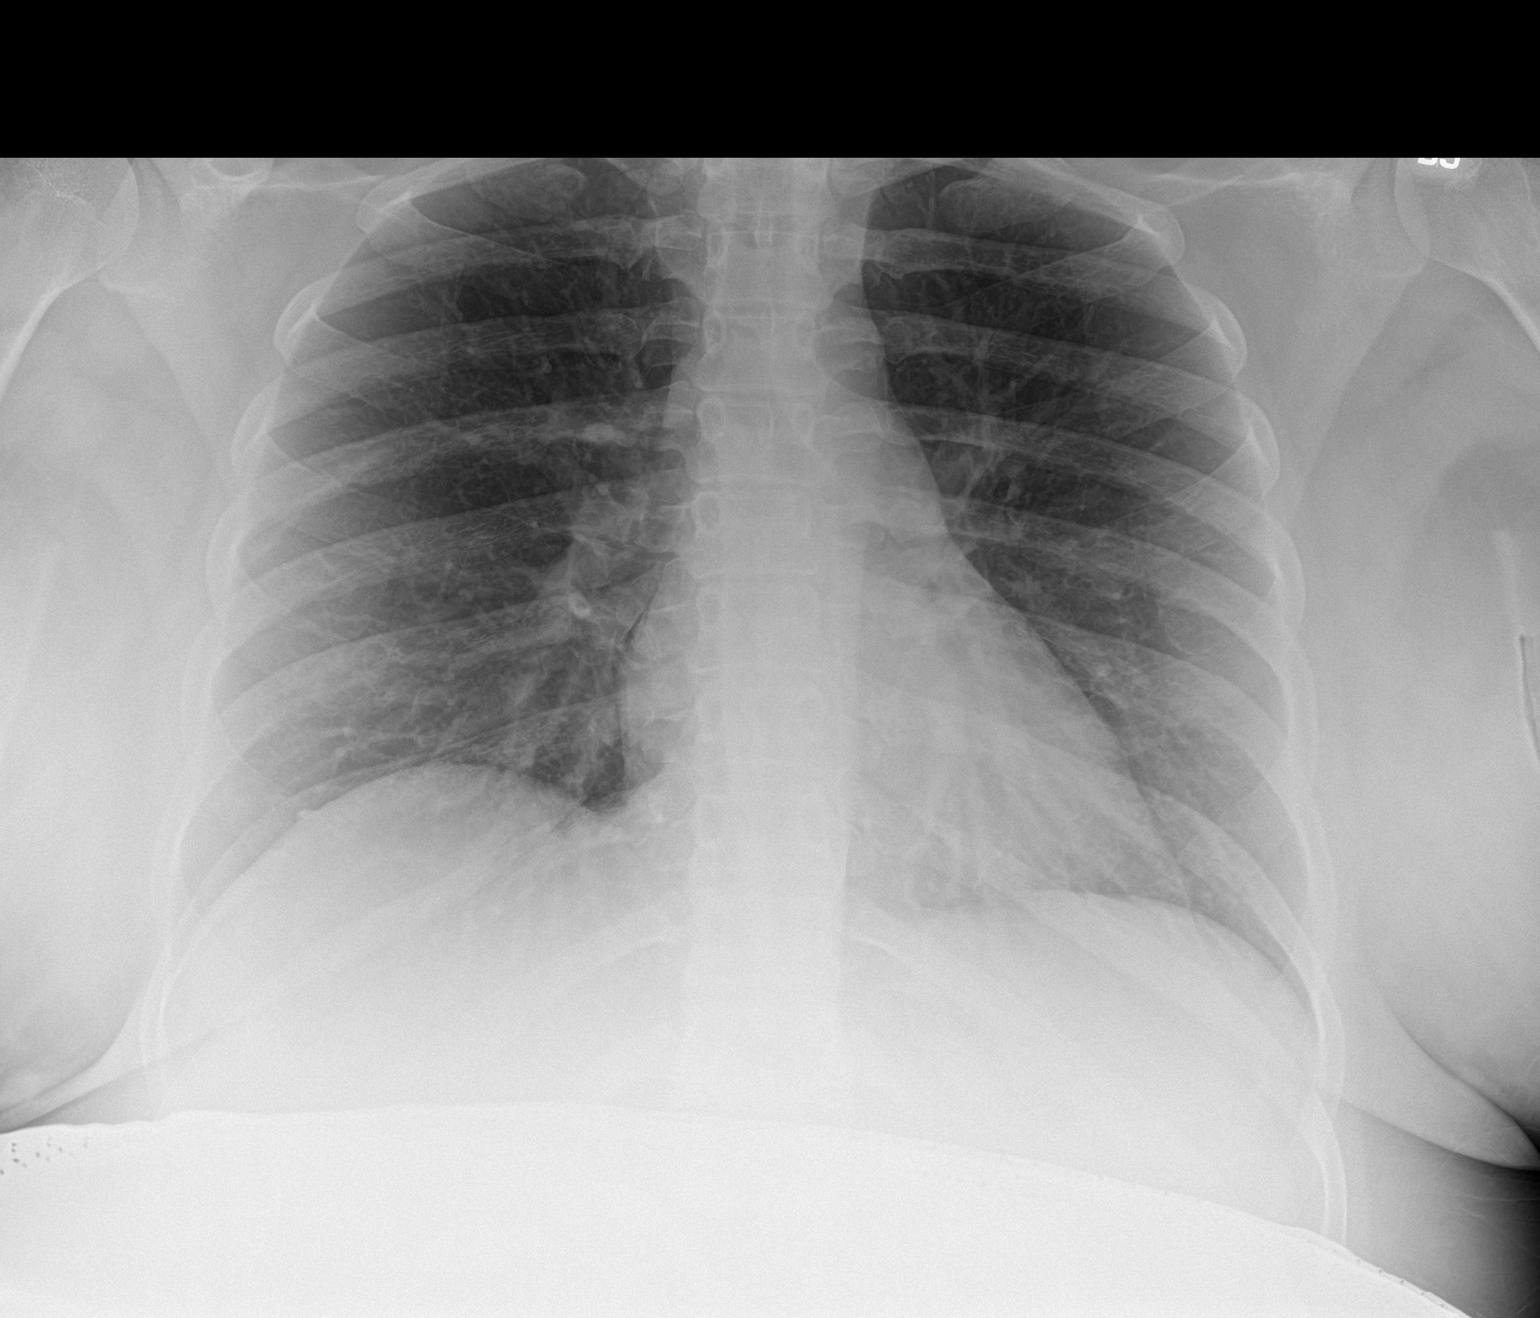

[1 of 1 positions shown; findings below may reference images not displayed]

FINDINGS: The heart size and mediastinal contours are within normal limits.
Both lungs are clear. The visualized skeletal structures are
unremarkable.
IMPRESSION: No active disease.

## 2022-05-11 DIAGNOSIS — R109 Unspecified abdominal pain: Secondary | ICD-10-CM | POA: Diagnosis not present

## 2022-05-11 DIAGNOSIS — R300 Dysuria: Secondary | ICD-10-CM | POA: Diagnosis not present

## 2022-05-11 DIAGNOSIS — Z880 Allergy status to penicillin: Secondary | ICD-10-CM | POA: Diagnosis not present

## 2022-05-17 ENCOUNTER — Encounter: Payer: Self-pay | Admitting: Family Medicine

## 2022-05-17 ENCOUNTER — Telehealth: Payer: Self-pay | Admitting: Family Medicine

## 2022-05-17 ENCOUNTER — Ambulatory Visit (INDEPENDENT_AMBULATORY_CARE_PROVIDER_SITE_OTHER): Payer: BC Managed Care – PPO | Admitting: Family Medicine

## 2022-05-17 VITALS — BP 138/72 | HR 90 | Ht 65.0 in | Wt 256.0 lb

## 2022-05-17 DIAGNOSIS — R7301 Impaired fasting glucose: Secondary | ICD-10-CM | POA: Diagnosis not present

## 2022-05-17 DIAGNOSIS — Z2821 Immunization not carried out because of patient refusal: Secondary | ICD-10-CM | POA: Diagnosis not present

## 2022-05-17 DIAGNOSIS — N898 Other specified noninflammatory disorders of vagina: Secondary | ICD-10-CM | POA: Diagnosis not present

## 2022-05-17 DIAGNOSIS — M5441 Lumbago with sciatica, right side: Secondary | ICD-10-CM

## 2022-05-17 DIAGNOSIS — G8929 Other chronic pain: Secondary | ICD-10-CM

## 2022-05-17 DIAGNOSIS — Z111 Encounter for screening for respiratory tuberculosis: Secondary | ICD-10-CM

## 2022-05-17 DIAGNOSIS — E038 Other specified hypothyroidism: Secondary | ICD-10-CM | POA: Diagnosis not present

## 2022-05-17 DIAGNOSIS — M5440 Lumbago with sciatica, unspecified side: Secondary | ICD-10-CM | POA: Diagnosis not present

## 2022-05-17 DIAGNOSIS — E559 Vitamin D deficiency, unspecified: Secondary | ICD-10-CM

## 2022-05-17 MED ORDER — CYCLOBENZAPRINE HCL 5 MG PO TABS: 5.0000 mg | ORAL_TABLET | Freq: Three times a day (TID) | ORAL | 1 refills | Status: DC | PRN

## 2022-05-17 NOTE — Assessment & Plan Note (Signed)
Flexeril 5 mg ordered for muscle spasm of the back Encouraged to continue taking Tylenol as needed for pain

## 2022-05-17 NOTE — Assessment & Plan Note (Signed)
She would like to be screened for TB for her work Quantiferon TB Gold is ordered

## 2022-05-17 NOTE — Telephone Encounter (Signed)
Patient called in with FYI  Only vaccines needed for work was the TB

## 2022-05-17 NOTE — Telephone Encounter (Signed)
Noted  

## 2022-05-17 NOTE — Progress Notes (Signed)
 New Patient Office Visit  Subjective:  Patient ID: Melanie Russo, female    DOB: 12/08/1995  Age: 26 y.o. MRN: [id number]  CC:  Chief Complaint  Patient presents with   Back Pain    Pt has some back pain and a knot on back started 05/15/22 and needs health examination certificate filled out and needs a tb skin test done as well.pt states she is having some vaginal discharge 05/14/22    HPI Melanie Russo is a 26 y.o. female with past medical history of chronic hypertension, depression anxiety, cystic fibrosis carrier, Sjogren's syndrome presents for establishing care.  Right-sided low back pain with right-sided sciatica: Chronic.  She reports aggravation of symptoms after irritating the knot on her back which has been present since the age of 4 years.  Pain is rated 5 out of 10.  She reports taking over-the-counter Tylenol for pain relief.  She denies recent injury or trauma to the lower back.  She denies bowel or bladder incontinence.  Vaginal discharge: She reports increased vaginal discharge that is clear.  She denies odor, itching or vaginal irritation.     Past Medical History:  Diagnosis Date   Anemia    Arthritis    Gestational diabetes    Hypertension    gestational    PONV (postoperative nausea and vomiting)    Sjogren's syndrome (HCC)     Past Surgical History:  Procedure Laterality Date   CESAREAN SECTION N/A 07/28/2020   Procedure: CESAREAN SECTION;  Surgeon: Eure, Luther H, MD;  Location: [facility];  Service: Obstetrics;  Laterality: N/A;   LAPAROSCOPIC BILATERAL SALPINGECTOMY Bilateral 10/08/2020   Procedure: LAPAROSCOPIC BILATERAL SALPINGECTOMY;  Surgeon: Eure, Luther H, MD;  Location: [facility];  Service: Gynecology;  Laterality: Bilateral;   NO PAST SURGERIES      Family History  Problem Relation Age of Onset   Thyroid disease Paternal Grandfather    Heart disease Paternal Grandmother    Hypertension Paternal Grandmother    Thyroid disease Paternal  Grandmother    Cancer Maternal Grandmother        brain   Hypertension Father    Arthritis Father    Autism Son    Obesity Maternal Uncle     Social History   Socioeconomic History   Marital status: Married    Spouse name: Not on file   Number of children: Not on file   Years of education: Not on file   Highest education level: Not on file  Occupational History   Not on file  Tobacco Use   Smoking status: Never   Smokeless tobacco: Never  Vaping Use   Vaping Use: Never used  Substance and Sexual Activity   Alcohol use: Not Currently    Comment: rarely   Drug use: Never   Sexual activity: Yes    Birth control/protection: Surgical    Comment: tubal  Other Topics Concern   Not on file  Social History Narrative   Not on file   Social Determinants of Health   Financial Resource Strain: Low Risk  (09/17/2021)   Overall Financial Resource Strain (CARDIA)    Difficulty of Paying Living Expenses: Not hard at all  Food Insecurity: No Food Insecurity (09/17/2021)   Hunger Vital Sign    Worried About Running Out of Food in the Last Year: Never true    Ran Out of Food in the Last Year: Never true  Transportation Needs: No Transportation Needs (09/17/2021)   PRAPARE - Transportation      Lack of Transportation (Medical): No    Lack of Transportation (Non-Medical): No  Physical Activity: Insufficiently Active (09/17/2021)   Exercise Vital Sign    Days of Exercise per Week: 3 days    Minutes of Exercise per Session: 40 min  Stress: Stress Concern Present (09/17/2021)   [place]    Feeling of Stress : To some extent  Social Connections: Moderately Isolated (09/17/2021)   Social Connection and Isolation Panel [NHANES]    Frequency of Communication with Friends and Family: Three times a week    Frequency of Social Gatherings with Friends and Family: Once a week    Attends Religious Services: Never    Corporate treasurer or Organizations: No    Attends Archivist Meetings: Never    Marital Status: Married  Human resources officer Violence: Not At Risk (09/17/2021)   Humiliation, Afraid, Rape, and Kick questionnaire    Fear of Current or Ex-Partner: No    Emotionally Abused: No    Physically Abused: No    Sexually Abused: No    ROS Review of Systems  Constitutional:  Negative for chills, fatigue and fever.  HENT:  Negative for sinus pressure and sinus pain.   Eyes:  Negative for visual disturbance.  Respiratory:  Negative for chest tightness and shortness of breath.   Cardiovascular:  Negative for chest pain and palpitations.  Gastrointestinal:  Negative for nausea and vomiting.  Endocrine: Negative for polydipsia, polyphagia and polyuria.  Genitourinary:  Positive for vaginal discharge. Negative for hematuria, menstrual problem, pelvic pain, urgency and vaginal pain.  Musculoskeletal:  Positive for back pain.  Skin:  Negative for rash and wound.  Neurological:  Negative for dizziness and headaches.  Psychiatric/Behavioral:  Negative for self-injury and suicidal ideas.     Objective:   Today's Vitals: BP 138/72 (BP Location: Right Arm, Patient Position: Sitting)   Pulse 90   Ht 5' 5" (1.651 m)   Wt 256 lb (116.1 kg)   SpO2 97%   BMI 42.60 kg/m   Physical Exam HENT:     Head: Normocephalic.     Right Ear: External ear normal.     Left Ear: External ear normal.     Nose: No congestion or rhinorrhea.     Mouth/Throat:     Mouth: Mucous membranes are moist.  Eyes:     Extraocular Movements: Extraocular movements intact.     Pupils: Pupils are equal, round, and reactive to light.  Cardiovascular:     Rate and Rhythm: Normal rate and regular rhythm.     Pulses: Normal pulses.     Heart sounds: Normal heart sounds.  Pulmonary:     Effort: Pulmonary effort is normal.     Breath sounds: Normal breath sounds.  Abdominal:     Tenderness: There is no right CVA tenderness or left CVA  tenderness.  Musculoskeletal:     Cervical back: No rigidity.     Lumbar back: No swelling, deformity, spasms or tenderness.     Right lower leg: No edema.     Left lower leg: No edema.  Skin:    Findings: No lesion or rash.  Neurological:     Mental Status: She is alert and oriented to person, place, and time.     Assessment & Plan:   Problem List Items Addressed This Visit       Nervous and Auditory   Right-sided low back pain with right-sided sciatica  Flexeril 5 mg ordered for muscle spasm of the back Encouraged to continue taking Tylenol as needed for pain       Relevant Medications   cyclobenzaprine (FLEXERIL) 5 MG tablet     Other   Vaginal discharge    Low suspicion of infection Increased vaginal discharge likely due to ovulation Will continue to monitor      Screening-pulmonary TB - Primary    She would like to be screened for TB for her work Quantiferon TB Gold is ordered      Relevant Orders   QuantiFERON-TB Gold Plus   Other Visit Diagnoses     Refused influenza vaccine       Vitamin D deficiency       Relevant Orders   Vitamin D (25 hydroxy)   IFG (impaired fasting glucose)       Relevant Orders   CBC with Differential/Platelet   CMP14+EGFR   Lipid Profile   Hemoglobin A1C   Other specified hypothyroidism       Relevant Orders   TSH + free T4   Acute right-sided low back pain with sciatica, sciatica laterality unspecified       Relevant Medications   cyclobenzaprine (FLEXERIL) 5 MG tablet       Outpatient Encounter Medications as of 05/17/2022  Medication Sig   Acetaminophen (TYLENOL PO) Take by mouth as needed.   cephALEXin (KEFLEX) 500 MG capsule Take 500 mg by mouth 2 (two) times daily.   cyclobenzaprine (FLEXERIL) 5 MG tablet Take 1 tablet (5 mg total) by mouth 3 (three) times daily as needed for muscle spasms.   No facility-administered encounter medications on file as of 05/17/2022.    Follow-up: Return in about 3 months  (around 08/17/2022).   Gloria  Zarwolo, FNP 

## 2022-05-17 NOTE — Assessment & Plan Note (Signed)
Low suspicion of infection Increased vaginal discharge likely due to ovulation Will continue to monitor

## 2022-05-17 NOTE — Patient Instructions (Addendum)
I appreciate the opportunity to provide care to you today!    Follow up:  3 months  Labs: please stop by the lab today to get your blood drawn (CBC, CMP, TSH, Lipid profile, HgA1c, Vit D)  Screening: quantiferon-TB  Please pick up your medications at the pharmacy   Please continue to a heart-healthy diet and increase your physical activities. Try to exercise for 75mins at least three times a week.      It was a pleasure to see you and I look forward to continuing to work together on your health and well-being. Please do not hesitate to call the office if you need care or have questions about your care.   Have a wonderful day and week. With Gratitude, Alvira Monday MSN, FNP-BC

## 2022-05-18 DIAGNOSIS — E559 Vitamin D deficiency, unspecified: Secondary | ICD-10-CM | POA: Diagnosis not present

## 2022-05-18 DIAGNOSIS — E038 Other specified hypothyroidism: Secondary | ICD-10-CM | POA: Diagnosis not present

## 2022-05-18 DIAGNOSIS — R7301 Impaired fasting glucose: Secondary | ICD-10-CM | POA: Diagnosis not present

## 2022-05-18 DIAGNOSIS — Z111 Encounter for screening for respiratory tuberculosis: Secondary | ICD-10-CM | POA: Diagnosis not present

## 2022-05-19 NOTE — Progress Notes (Signed)
Please inform the patient that her cons of hearing TB Gold test has not resulted.  Her vitamin D is slightly low, I recommend increasing her intake of vitamin D rich foods.

## 2022-05-21 LAB — LIPID PANEL
Chol/HDL Ratio: 2.9 ratio (ref 0.0–4.4)
Cholesterol, Total: 152 mg/dL (ref 100–199)
HDL: 52 mg/dL (ref >39)
LDL Chol Calc (NIH): 89 mg/dL (ref 0–99)
Triglycerides: 52 mg/dL (ref 0–149)
VLDL Cholesterol Cal: 11 mg/dL (ref 5–40)

## 2022-05-21 LAB — CMP14+EGFR
ALT: 14 IU/L (ref 0–32)
AST: 13 IU/L (ref 0–40)
Albumin/Globulin Ratio: 1.7 (ref 1.2–2.2)
Albumin: 4.4 g/dL (ref 4.0–5.0)
Alkaline Phosphatase: 99 IU/L (ref 44–121)
BUN/Creatinine Ratio: 11 (ref 9–23)
BUN: 8 mg/dL (ref 6–20)
Bilirubin Total: 0.3 mg/dL (ref 0.0–1.2)
CO2: 22 mmol/L (ref 20–29)
Calcium: 9.5 mg/dL (ref 8.7–10.2)
Chloride: 101 mmol/L (ref 96–106)
Creatinine, Ser: 0.74 mg/dL (ref 0.57–1.00)
Globulin, Total: 2.6 g/dL (ref 1.5–4.5)
Glucose: 88 mg/dL (ref 70–99)
Potassium: 4.5 mmol/L (ref 3.5–5.2)
Sodium: 139 mmol/L (ref 134–144)
Total Protein: 7 g/dL (ref 6.0–8.5)
eGFR: 114 mL/min/{1.73_m2} (ref >59)

## 2022-05-21 LAB — CBC WITH DIFFERENTIAL/PLATELET
Basophils Absolute: 0 10*3/uL (ref 0.0–0.2)
Basos: 1 %
EOS (ABSOLUTE): 0.1 10*3/uL (ref 0.0–0.4)
Eos: 1 %
Hematocrit: 38.9 % (ref 34.0–46.6)
Hemoglobin: 12.1 g/dL (ref 11.1–15.9)
Immature Grans (Abs): 0 10*3/uL (ref 0.0–0.1)
Immature Granulocytes: 0 %
Lymphocytes Absolute: 2 10*3/uL (ref 0.7–3.1)
Lymphs: 30 %
MCH: 24.4 pg — ABNORMAL LOW (ref 26.6–33.0)
MCHC: 31.1 g/dL — ABNORMAL LOW (ref 31.5–35.7)
MCV: 78 fL — ABNORMAL LOW (ref 79–97)
Monocytes Absolute: 0.5 10*3/uL (ref 0.1–0.9)
Monocytes: 7 %
Neutrophils Absolute: 4.3 10*3/uL (ref 1.4–7.0)
Neutrophils: 61 %
Platelets: 286 10*3/uL (ref 150–450)
RBC: 4.96 x10E6/uL (ref 3.77–5.28)
RDW: 14.8 % (ref 11.7–15.4)
WBC: 6.9 10*3/uL (ref 3.4–10.8)

## 2022-05-21 LAB — TSH+FREE T4
Free T4: 1.14 ng/dL (ref 0.82–1.77)
TSH: 3.1 u[IU]/mL (ref 0.450–4.500)

## 2022-05-21 LAB — VITAMIN D 25 HYDROXY (VIT D DEFICIENCY, FRACTURES): Vit D, 25-Hydroxy: 29.4 ng/mL — ABNORMAL LOW (ref 30.0–100.0)

## 2022-05-21 LAB — QUANTIFERON-TB GOLD PLUS
QuantiFERON Mitogen Value: >10 IU/mL
QuantiFERON Nil Value: 0.05 IU/mL
QuantiFERON TB1 Ag Value: 0.06 IU/mL
QuantiFERON TB2 Ag Value: 0.05 IU/mL
QuantiFERON-TB Gold Plus: NEGATIVE

## 2022-05-21 LAB — HEMOGLOBIN A1C
Est. average glucose Bld gHb Est-mCnc: 114 mg/dL
Hgb A1c MFr Bld: 5.6 % (ref 4.8–5.6)

## 2022-05-24 NOTE — Progress Notes (Signed)
Please inform the patient that her quantiferon TB test result was negative

## 2022-05-25 ENCOUNTER — Ambulatory Visit: Payer: BC Managed Care – PPO | Admitting: Family Medicine

## 2022-06-11 DIAGNOSIS — Z20822 Contact with and (suspected) exposure to covid-19: Secondary | ICD-10-CM | POA: Diagnosis not present

## 2022-06-11 DIAGNOSIS — R059 Cough, unspecified: Secondary | ICD-10-CM | POA: Diagnosis not present

## 2022-08-26 DIAGNOSIS — R5381 Other malaise: Secondary | ICD-10-CM | POA: Diagnosis not present

## 2022-08-26 DIAGNOSIS — R0609 Other forms of dyspnea: Secondary | ICD-10-CM | POA: Diagnosis not present

## 2022-08-26 DIAGNOSIS — U071 COVID-19: Secondary | ICD-10-CM | POA: Diagnosis not present

## 2022-08-31 DIAGNOSIS — R0602 Shortness of breath: Secondary | ICD-10-CM | POA: Diagnosis not present

## 2022-08-31 DIAGNOSIS — R042 Hemoptysis: Secondary | ICD-10-CM | POA: Diagnosis not present

## 2022-08-31 DIAGNOSIS — U071 COVID-19: Secondary | ICD-10-CM | POA: Diagnosis not present

## 2022-08-31 DIAGNOSIS — R9431 Abnormal electrocardiogram [ECG] [EKG]: Secondary | ICD-10-CM | POA: Diagnosis not present

## 2022-08-31 DIAGNOSIS — R000 Tachycardia, unspecified: Secondary | ICD-10-CM | POA: Diagnosis not present

## 2022-08-31 DIAGNOSIS — M3500 Sicca syndrome, unspecified: Secondary | ICD-10-CM | POA: Diagnosis not present

## 2022-08-31 DIAGNOSIS — Z880 Allergy status to penicillin: Secondary | ICD-10-CM | POA: Diagnosis not present

## 2023-02-02 DIAGNOSIS — K0889 Other specified disorders of teeth and supporting structures: Secondary | ICD-10-CM | POA: Diagnosis not present

## 2023-02-02 DIAGNOSIS — M3500 Sicca syndrome, unspecified: Secondary | ICD-10-CM | POA: Diagnosis not present

## 2023-02-02 DIAGNOSIS — K047 Periapical abscess without sinus: Secondary | ICD-10-CM | POA: Diagnosis not present

## 2023-02-02 DIAGNOSIS — K029 Dental caries, unspecified: Secondary | ICD-10-CM | POA: Diagnosis not present

## 2023-02-02 DIAGNOSIS — Z880 Allergy status to penicillin: Secondary | ICD-10-CM | POA: Diagnosis not present

## 2023-02-02 DIAGNOSIS — Z91040 Latex allergy status: Secondary | ICD-10-CM | POA: Diagnosis not present

## 2023-05-27 DIAGNOSIS — R030 Elevated blood-pressure reading, without diagnosis of hypertension: Secondary | ICD-10-CM | POA: Diagnosis not present

## 2023-05-27 DIAGNOSIS — R5383 Other fatigue: Secondary | ICD-10-CM | POA: Diagnosis not present

## 2023-05-27 DIAGNOSIS — R519 Headache, unspecified: Secondary | ICD-10-CM | POA: Diagnosis not present

## 2023-05-27 DIAGNOSIS — R110 Nausea: Secondary | ICD-10-CM | POA: Diagnosis not present

## 2023-06-28 DIAGNOSIS — H4710 Unspecified papilledema: Secondary | ICD-10-CM | POA: Diagnosis not present

## 2023-07-04 DIAGNOSIS — Z880 Allergy status to penicillin: Secondary | ICD-10-CM | POA: Diagnosis not present

## 2023-07-04 DIAGNOSIS — R519 Headache, unspecified: Secondary | ICD-10-CM | POA: Diagnosis not present

## 2023-07-04 DIAGNOSIS — R930 Abnormal findings on diagnostic imaging of skull and head, not elsewhere classified: Secondary | ICD-10-CM | POA: Diagnosis not present

## 2023-07-04 DIAGNOSIS — Z91010 Allergy to peanuts: Secondary | ICD-10-CM | POA: Diagnosis not present

## 2023-07-04 DIAGNOSIS — G932 Benign intracranial hypertension: Secondary | ICD-10-CM | POA: Diagnosis not present

## 2023-07-04 DIAGNOSIS — Z91040 Latex allergy status: Secondary | ICD-10-CM | POA: Diagnosis not present

## 2023-07-05 DIAGNOSIS — R519 Headache, unspecified: Secondary | ICD-10-CM | POA: Diagnosis not present

## 2023-07-05 DIAGNOSIS — H539 Unspecified visual disturbance: Secondary | ICD-10-CM | POA: Diagnosis not present

## 2023-07-05 DIAGNOSIS — Z91040 Latex allergy status: Secondary | ICD-10-CM | POA: Diagnosis not present

## 2023-07-05 DIAGNOSIS — R29818 Other symptoms and signs involving the nervous system: Secondary | ICD-10-CM | POA: Diagnosis not present

## 2023-07-05 DIAGNOSIS — G932 Benign intracranial hypertension: Secondary | ICD-10-CM | POA: Diagnosis not present

## 2023-07-05 DIAGNOSIS — Z6841 Body Mass Index (BMI) 40.0 and over, adult: Secondary | ICD-10-CM | POA: Diagnosis not present

## 2023-07-05 DIAGNOSIS — M3500 Sicca syndrome, unspecified: Secondary | ICD-10-CM | POA: Diagnosis not present

## 2023-07-05 DIAGNOSIS — Z880 Allergy status to penicillin: Secondary | ICD-10-CM | POA: Diagnosis not present

## 2023-07-05 DIAGNOSIS — E669 Obesity, unspecified: Secondary | ICD-10-CM | POA: Diagnosis not present

## 2023-07-05 DIAGNOSIS — M1990 Unspecified osteoarthritis, unspecified site: Secondary | ICD-10-CM | POA: Diagnosis not present

## 2023-07-05 DIAGNOSIS — Z91010 Allergy to peanuts: Secondary | ICD-10-CM | POA: Diagnosis not present

## 2023-07-06 DIAGNOSIS — M1990 Unspecified osteoarthritis, unspecified site: Secondary | ICD-10-CM | POA: Diagnosis not present

## 2023-07-06 DIAGNOSIS — M3500 Sicca syndrome, unspecified: Secondary | ICD-10-CM | POA: Diagnosis not present

## 2023-07-06 DIAGNOSIS — Z91040 Latex allergy status: Secondary | ICD-10-CM | POA: Diagnosis not present

## 2023-07-06 DIAGNOSIS — R519 Headache, unspecified: Secondary | ICD-10-CM | POA: Diagnosis not present

## 2023-07-06 DIAGNOSIS — G932 Benign intracranial hypertension: Secondary | ICD-10-CM | POA: Diagnosis not present

## 2023-07-06 DIAGNOSIS — Z6841 Body Mass Index (BMI) 40.0 and over, adult: Secondary | ICD-10-CM | POA: Diagnosis not present

## 2023-07-06 DIAGNOSIS — H539 Unspecified visual disturbance: Secondary | ICD-10-CM | POA: Diagnosis not present

## 2023-07-06 DIAGNOSIS — E669 Obesity, unspecified: Secondary | ICD-10-CM | POA: Diagnosis not present

## 2023-07-10 ENCOUNTER — Encounter: Payer: Self-pay | Admitting: Family Medicine

## 2023-07-19 DIAGNOSIS — G932 Benign intracranial hypertension: Secondary | ICD-10-CM | POA: Diagnosis not present

## 2023-08-11 DIAGNOSIS — Z880 Allergy status to penicillin: Secondary | ICD-10-CM | POA: Diagnosis not present

## 2023-08-11 DIAGNOSIS — R110 Nausea: Secondary | ICD-10-CM | POA: Diagnosis not present

## 2023-08-11 DIAGNOSIS — Z91010 Allergy to peanuts: Secondary | ICD-10-CM | POA: Diagnosis not present

## 2023-08-11 DIAGNOSIS — K2900 Acute gastritis without bleeding: Secondary | ICD-10-CM | POA: Diagnosis not present

## 2023-08-11 DIAGNOSIS — R1013 Epigastric pain: Secondary | ICD-10-CM | POA: Diagnosis not present

## 2023-08-11 DIAGNOSIS — R109 Unspecified abdominal pain: Secondary | ICD-10-CM | POA: Diagnosis not present

## 2023-08-11 DIAGNOSIS — Z91040 Latex allergy status: Secondary | ICD-10-CM | POA: Diagnosis not present

## 2023-08-15 ENCOUNTER — Ambulatory Visit: Payer: BC Managed Care – PPO | Admitting: Family Medicine

## 2023-08-19 ENCOUNTER — Ambulatory Visit: Payer: BC Managed Care – PPO | Admitting: Family Medicine

## 2023-09-20 ENCOUNTER — Ambulatory Visit: Payer: BC Managed Care – PPO | Admitting: Family Medicine

## 2023-09-20 ENCOUNTER — Encounter: Payer: Self-pay | Admitting: Family Medicine

## 2023-09-20 DIAGNOSIS — G932 Benign intracranial hypertension: Secondary | ICD-10-CM | POA: Diagnosis not present

## 2023-09-20 DIAGNOSIS — R1030 Lower abdominal pain, unspecified: Secondary | ICD-10-CM | POA: Diagnosis not present

## 2023-09-20 DIAGNOSIS — Z6841 Body Mass Index (BMI) 40.0 and over, adult: Secondary | ICD-10-CM | POA: Diagnosis not present

## 2023-09-20 DIAGNOSIS — R109 Unspecified abdominal pain: Secondary | ICD-10-CM | POA: Diagnosis not present

## 2023-09-20 DIAGNOSIS — K5900 Constipation, unspecified: Secondary | ICD-10-CM | POA: Diagnosis not present

## 2023-09-20 DIAGNOSIS — E669 Obesity, unspecified: Secondary | ICD-10-CM | POA: Diagnosis not present

## 2023-09-20 DIAGNOSIS — M3500 Sicca syndrome, unspecified: Secondary | ICD-10-CM | POA: Diagnosis not present

## 2023-09-20 DIAGNOSIS — Z880 Allergy status to penicillin: Secondary | ICD-10-CM | POA: Diagnosis not present

## 2023-09-20 DIAGNOSIS — Z91040 Latex allergy status: Secondary | ICD-10-CM | POA: Diagnosis not present

## 2023-09-20 DIAGNOSIS — R195 Other fecal abnormalities: Secondary | ICD-10-CM | POA: Diagnosis not present

## 2023-09-22 ENCOUNTER — Encounter: Payer: Self-pay | Admitting: Family Medicine

## 2023-09-22 ENCOUNTER — Other Ambulatory Visit: Payer: Self-pay | Admitting: Family Medicine

## 2023-09-22 ENCOUNTER — Ambulatory Visit (INDEPENDENT_AMBULATORY_CARE_PROVIDER_SITE_OTHER): Payer: BLUE CROSS/BLUE SHIELD | Admitting: Family Medicine

## 2023-09-22 VITALS — BP 124/89 | HR 80 | Ht 65.0 in | Wt 254.0 lb

## 2023-09-22 DIAGNOSIS — E559 Vitamin D deficiency, unspecified: Secondary | ICD-10-CM | POA: Diagnosis not present

## 2023-09-22 DIAGNOSIS — R7301 Impaired fasting glucose: Secondary | ICD-10-CM | POA: Diagnosis not present

## 2023-09-22 DIAGNOSIS — E038 Other specified hypothyroidism: Secondary | ICD-10-CM

## 2023-09-22 DIAGNOSIS — R002 Palpitations: Secondary | ICD-10-CM | POA: Diagnosis not present

## 2023-09-22 DIAGNOSIS — K921 Melena: Secondary | ICD-10-CM

## 2023-09-22 DIAGNOSIS — E7849 Other hyperlipidemia: Secondary | ICD-10-CM | POA: Diagnosis not present

## 2023-09-22 DIAGNOSIS — G932 Benign intracranial hypertension: Secondary | ICD-10-CM

## 2023-09-22 NOTE — Patient Instructions (Addendum)
I appreciate the opportunity to provide care to you today!    Follow up: 4 months  Labs: please stop by the lab during the week to get your blood drawn (CBC, CMP, TSH, Lipid profile, HgA1c, Vit D)  Headaches Continue your current treatment regimen until your follow-up with neurology. Please seek immediate medical attention at the Emergency Department if you experience any of the following symptoms in conjunction with your headaches:  Acute onset of a severe headache described as "the worst headache of my life," particularly in a patient with no prior history of headaches. Unrelenting headaches that do not improve with conservative treatments or pain that progressively worsens. Lancinating or "ice-pick" pain. Severe headaches accompanied by a stiff neck and/or fever. Headaches associated with changes in mental status or level of consciousness. Persistent headaches following trauma to the head or neck. A significant change in pattern or severity of headaches in a patient with a longstanding history of chronic headaches. Your prompt evaluation is crucial for appropriate diagnosis and management.    Referrals today-  Cardiology and Neurology  Attached with your AVS, you will find valuable resources for self-education. I highly recommend dedicating some time to thoroughly examine them.   Please continue to a heart-healthy diet and increase your physical activities. Try to exercise for at least five days a week.    It was a pleasure to see you and I look forward to continuing to work together on your health and well-being. Please do not hesitate to call the office if you need care or have questions about your care.  In case of emergency, please visit the Emergency Department for urgent care, or contact our clinic at [phone] to schedule an appointment. We're here to help you!   Have a wonderful day and week. With Gratitude, Gilmore Laroche MSN, FNP-BC

## 2023-09-22 NOTE — Progress Notes (Unsigned)
Established Patient Office Visit  Subjective:  Patient ID: Melanie Russo, female    DOB: 1996-07-21  Age: 28 y.o. MRN: [id number]  CC:  Chief Complaint  Patient presents with   Follow-up    Check up.  Constant headaches due to IIH:   having facial numbness   tingling down left arm Tired all the time.  Heart Palpitations     HPI Melanie Russo is a 28 y.o. female with past medical history of Idiopathic intracranial hypertension,obesity, arthritis and Sjogren's disease   presents for f/u of  chronic medical conditions.  ICH: The patient complains of constant headaches and is taking on Diamox 500 mg total by mouth twice daily, along with Tylenol 500 mg every six hours as needed for pain. She reports that her headache today is 5/10 and has not yet taken her prescribed treatment. Her ophthalmologist noted pressure on the optic nerve of the left eye and referred her to neurology for further evaluation. On July 04, 2023, she was seen at [facility] ED and transferred to [facility] for specialized care. At [facility], she was evaluated by neurology and cardiology, and an MRI of the brain showed a partially empty sella turcica, a nonspecific finding that can be seen in Idiopathic Intracranial Hypertension (IIH). She was then started on Diamox, which resulted in complete resolution of her headaches and visual changes. She also underwent a lumbar puncture (LP) with an opening pressure of 300 mm H2O and a closing pressure of 140 mm H2O after draining 16 cc's of CSF. Neurology determined that her MRI, symptoms, and elevated opening pressure were consistent with IIH.The patient has a neurology appointment with [facility] on November 03, 2023, but she is requesting a referral within the [facility] system for proximity of care.Since her hospital visit, she reports new neurological symptoms, including facial numbness, tingling down her left arm, and intermittent sour and burning smell sensations. She denies slurred  speech, weakness, or facial drooping.  Palpitations: The patient reports ongoing palpitations for a few months, occurring at rest, accompanied by chest tightness and shortness of breath. She denies lightheadedness, fatigue, or weakness. Her symptoms typically last about five minutes and occur 1 to 2 times per month.   Past Medical History:  Diagnosis Date   Anemia    Arthritis    Gestational diabetes    Hypertension    gestational    PONV (postoperative nausea and vomiting)    Sjogren's syndrome (HCC)     Past Surgical History:  Procedure Laterality Date   CESAREAN SECTION N/A 07/28/2020   Procedure: CESAREAN SECTION;  Surgeon: Lazaro Arms, MD;  Location: [facility] LD ORS;  Service: Obstetrics;  Laterality: N/A;   LAPAROSCOPIC BILATERAL SALPINGECTOMY Bilateral 10/08/2020   Procedure: LAPAROSCOPIC BILATERAL SALPINGECTOMY;  Surgeon: Lazaro Arms, MD;  Location: [facility] ORS;  Service: Gynecology;  Laterality: Bilateral;   NO PAST SURGERIES      Family History  Problem Relation Age of Onset   Thyroid disease Paternal Grandfather    Heart disease Paternal Grandmother    Hypertension Paternal Grandmother    Thyroid disease Paternal Grandmother    Cancer Maternal Grandmother        brain   Hypertension Father    Arthritis Father    Autism Son    Obesity Maternal Uncle     Social History   Socioeconomic History   Marital status: Married    Spouse name: Not on file   Number of children: Not on file  Years of education: Not on file   Highest education level: Bachelor's degree (e.g., BA, AB, BS)  Occupational History   Not on file  Tobacco Use   Smoking status: Never   Smokeless tobacco: Never  Vaping Use   Vaping status: Never Used  Substance and Sexual Activity   Alcohol use: Not Currently    Comment: rarely   Drug use: Never   Sexual activity: Yes    Birth control/protection: Surgical    Comment: tubal  Other Topics Concern   Not on file  Social History Narrative   Not  on file   Social Drivers of Health   Financial Resource Strain: Low Risk  (09/18/2023)   Overall Financial Resource Strain (CARDIA)    Difficulty of Paying Living Expenses: Not hard at all  Food Insecurity: No Food Insecurity (09/18/2023)   Hunger Vital Sign    Worried About Running Out of Food in the Last Year: Never true    Ran Out of Food in the Last Year: Never true  Transportation Needs: No Transportation Needs (09/18/2023)   PRAPARE - Administrator, Civil Service (Medical): No    Lack of Transportation (Non-Medical): No  Physical Activity: Insufficiently Active (09/18/2023)   Exercise Vital Sign    Days of Exercise per Week: 2 days    Minutes of Exercise per Session: 30 min  Stress: No Stress Concern Present (09/18/2023)   [employer] of Occupational Health - Occupational Stress Questionnaire    Feeling of Stress : Not at all  Social Connections: Unknown (09/18/2023)   Social Connection and Isolation Panel [NHANES]    Frequency of Communication with Friends and Family: Twice a week    Frequency of Social Gatherings with Friends and Family: Patient declined    Attends Religious Services: Patient declined    Database administrator or Organizations: No    Attends Engineer, structural: Not on file    Marital Status: Married  Catering manager Violence: Not At Risk (09/17/2021)   Humiliation, Afraid, Rape, and Kick questionnaire    Fear of Current or Ex-Partner: No    Emotionally Abused: No    Physically Abused: No    Sexually Abused: No    Outpatient Medications Prior to Visit  Medication Sig Dispense Refill   Acetaminophen (TYLENOL PO) Take by mouth as needed.     acetaZOLAMIDE (DIAMOX) 250 MG tablet Take 250 mg by mouth 2 (two) times daily.     cephALEXin (KEFLEX) 500 MG capsule Take 500 mg by mouth 2 (two) times daily.     cyclobenzaprine (FLEXERIL) 5 MG tablet Take 1 tablet (5 mg total) by mouth 3 (three) times daily as needed for muscle spasms. 30 tablet  1   No facility-administered medications prior to visit.    Allergies  Allergen Reactions   Kiwi Extract Anaphylaxis and Swelling    Tightness in the throat   Other Anaphylaxis    Blueberries, blackberries, raspberries, nuts- pt reports that it feels like she is being stabbed in the mouth when eating nuts    Peanut Oil Swelling    Tightness in the throat   Raspberry Swelling    Tightness in the throat   Rubus Fruticosus Swelling    Tightness in the throat   Vaccinium Angustifolium Swelling    Tightness in the throat   Latex Hives and Swelling    Burning   Penicillins Hives    Reaction: 2016   Tree Extract  ROS Review of Systems  Constitutional:  Negative for chills and fever.  Eyes:  Negative for visual disturbance.  Respiratory:  Negative for chest tightness and shortness of breath.   Neurological:  Positive for headaches. Negative for dizziness.      Objective:    Physical Exam HENT:     Head: Normocephalic.     Mouth/Throat:     Mouth: Mucous membranes are moist.  Eyes:     General: No visual field deficit. Cardiovascular:     Rate and Rhythm: Normal rate.     Heart sounds: Normal heart sounds.  Pulmonary:     Effort: Pulmonary effort is normal.     Breath sounds: Normal breath sounds.  Neurological:     Mental Status: She is alert and oriented to person, place, and time.     GCS: GCS eye subscore is 4. GCS verbal subscore is 5. GCS motor subscore is 6.     Cranial Nerves: No facial asymmetry.     Motor: No weakness, tremor or pronator drift.     Coordination: Romberg sign negative.     Gait: Gait is intact.     BP 124/89   Pulse 80   Ht 5\' 5"  (1.651 m)   Wt 254 lb 0.6 oz (115.2 kg)   LMP 09/03/2023   SpO2 96%   BMI 42.27 kg/m  Wt Readings from Last 3 Encounters:  09/22/23 254 lb 0.6 oz (115.2 kg)  05/17/22 256 lb (116.1 kg)  09/17/21 262 lb 8 oz (119.1 kg)    Lab Results  Component Value Date   TSH 3.100 05/18/2022   Lab Results   Component Value Date   WBC 6.9 05/18/2022   HGB 12.1 05/18/2022   HCT 38.9 05/18/2022   MCV 78 (L) 05/18/2022   PLT 286 05/18/2022   Lab Results  Component Value Date   NA 139 05/18/2022   K 4.5 05/18/2022   CO2 22 05/18/2022   GLUCOSE 88 05/18/2022   BUN 8 05/18/2022   CREATININE 0.74 05/18/2022   BILITOT 0.3 05/18/2022   ALKPHOS 99 05/18/2022   AST 13 05/18/2022   ALT 14 05/18/2022   PROT 7.0 05/18/2022   ALBUMIN 4.4 05/18/2022   CALCIUM 9.5 05/18/2022   ANIONGAP 10 10/06/2020   EGFR 114 05/18/2022   Lab Results  Component Value Date   CHOL 152 05/18/2022   Lab Results  Component Value Date   HDL 52 05/18/2022   Lab Results  Component Value Date   LDLCALC 89 05/18/2022   Lab Results  Component Value Date   TRIG 52 05/18/2022   Lab Results  Component Value Date   CHOLHDL 2.9 05/18/2022   Lab Results  Component Value Date   HGBA1C 5.6 05/18/2022      Assessment & Plan:  Idiopathic intracranial hypertension Assessment & Plan: Referral to neurology within [facility] for continuity of care and proximity. Encouraged to take Diamox as prescribed to manage IIH symptoms. Encouraged to monitor for worsening neurological symptoms, including persistent numbness, vision changes, or increased headache severity. Encouraged to seek urgent medical care if symptoms worsen, including persistent neurological deficits, severe headaches, or prolonged palpitations with dizziness or fainting.   Orders: -     Ambulatory referral to Neurology  Palpitation Assessment & Plan: The EKG in the clinic showed normal sinus rhythm. A referral was placed to cardiology for further cardiac evaluation. The patient was educated on signs of palpitations that warrant seeking emergency care, including:  Palpitations lasting longer than a few minutes or occurring frequently Heart rate consistently over 100 BPM (tachycardia) or below 50 BPM (bradycardia) Palpitations accompanied by  chest pain, shortness of breath, dizziness, or fainting New or worsening symptoms with a history of heart disease, high blood pressure, or stroke   Orders: -     Ambulatory referral to Cardiology -     EKG 12-Lead -     EKG 12-Lead  IFG (impaired fasting glucose) -     Hemoglobin A1c  Vitamin D deficiency -     VITAMIN D 25 Hydroxy (Vit-D Deficiency, Fractures)  TSH (thyroid-stimulating hormone deficiency) -     TSH + free T4  Other hyperlipidemia -     Lipid panel -     CMP14+EGFR -     CBC with Differential/Platelet  Note: This chart has been completed using Engineer, civil (consulting) software, and while attempts have been made to ensure accuracy, certain words and phrases may not be transcribed as intended.    Follow-up: No follow-ups on file.   Gilmore Laroche, FNP

## 2023-09-22 NOTE — Progress Notes (Signed)
The patient sent a message on September 20, 2023, stating:  "I tried being seen because when I go to have a bowel movement, all I see in the toilet is blood. The ED at [facility] said I must be constipated and pushed too hard, but I don't feel like I do when I'm constipated, and I was wondering how long it would take for the bleeding to stop.  Anytime I feel like I need to go, it's just blood. Normally, when this happens, I don't have bleeding. I just feel really bloated and in pain, and once I go, it's done. But it's usually a one-day thing every few months; it's never lasted a couple of days.  I wanted to reach out so at worst, we could discuss it at my upcoming appointment, but I'm still in pain, and I've had bleeding each time I go since about 10 PM last night."  A referral will be placed to GI for further evaluation, and labs will be ordered.

## 2023-09-23 DIAGNOSIS — E7849 Other hyperlipidemia: Secondary | ICD-10-CM | POA: Diagnosis not present

## 2023-09-23 DIAGNOSIS — R7301 Impaired fasting glucose: Secondary | ICD-10-CM | POA: Diagnosis not present

## 2023-09-23 DIAGNOSIS — E559 Vitamin D deficiency, unspecified: Secondary | ICD-10-CM | POA: Diagnosis not present

## 2023-09-23 DIAGNOSIS — R002 Palpitations: Secondary | ICD-10-CM | POA: Insufficient documentation

## 2023-09-23 DIAGNOSIS — E038 Other specified hypothyroidism: Secondary | ICD-10-CM | POA: Diagnosis not present

## 2023-09-23 NOTE — Assessment & Plan Note (Addendum)
The EKG in the clinic showed normal sinus rhythm. A referral was placed to cardiology for further cardiac evaluation. The patient was educated on signs of palpitations that warrant seeking emergency care, including:  Palpitations lasting longer than a few minutes or occurring frequently Heart rate consistently over 100 BPM (tachycardia) or below 50 BPM (bradycardia) Palpitations accompanied by chest pain, shortness of breath, dizziness, or fainting New or worsening symptoms with a history of heart disease, high blood pressure, or stroke

## 2023-09-23 NOTE — Assessment & Plan Note (Signed)
Referral to neurology within [facility] for continuity of care and proximity. Encouraged to take Diamox as prescribed to manage IIH symptoms. Encouraged to monitor for worsening neurological symptoms, including persistent numbness, vision changes, or increased headache severity. Encouraged to seek urgent medical care if symptoms worsen, including persistent neurological deficits, severe headaches, or prolonged palpitations with dizziness or fainting.

## 2023-09-24 LAB — CMP14+EGFR
ALT: 11 [IU]/L (ref 0–32)
AST: 14 [IU]/L (ref 0–40)
Albumin: 4.5 g/dL (ref 4.0–5.0)
Alkaline Phosphatase: 86 [IU]/L (ref 44–121)
BUN/Creatinine Ratio: 10 (ref 9–23)
BUN: 8 mg/dL (ref 6–20)
Bilirubin Total: 0.5 mg/dL (ref 0.0–1.2)
CO2: 17 mmol/L — ABNORMAL LOW (ref 20–29)
Calcium: 8.9 mg/dL (ref 8.7–10.2)
Chloride: 107 mmol/L — ABNORMAL HIGH (ref 96–106)
Creatinine, Ser: 0.78 mg/dL (ref 0.57–1.00)
Globulin, Total: 2.5 g/dL (ref 1.5–4.5)
Glucose: 84 mg/dL (ref 70–99)
Potassium: 4 mmol/L (ref 3.5–5.2)
Sodium: 139 mmol/L (ref 134–144)
Total Protein: 7 g/dL (ref 6.0–8.5)
eGFR: 106 mL/min/{1.73_m2} (ref >59)

## 2023-09-24 LAB — LIPID PANEL
Chol/HDL Ratio: 2.7 {ratio} (ref 0.0–4.4)
Cholesterol, Total: 140 mg/dL (ref 100–199)
HDL: 51 mg/dL (ref >39)
LDL Chol Calc (NIH): 77 mg/dL (ref 0–99)
Triglycerides: 55 mg/dL (ref 0–149)
VLDL Cholesterol Cal: 12 mg/dL (ref 5–40)

## 2023-09-24 LAB — CBC WITH DIFFERENTIAL/PLATELET
Basophils Absolute: 0 10*3/uL (ref 0.0–0.2)
Basos: 1 %
EOS (ABSOLUTE): 0.1 10*3/uL (ref 0.0–0.4)
Eos: 1 %
Hematocrit: 40.6 % (ref 34.0–46.6)
Hemoglobin: 13 g/dL (ref 11.1–15.9)
Immature Grans (Abs): 0 10*3/uL (ref 0.0–0.1)
Immature Granulocytes: 0 %
Lymphocytes Absolute: 1.6 10*3/uL (ref 0.7–3.1)
Lymphs: 27 %
MCH: 27.1 pg (ref 26.6–33.0)
MCHC: 32 g/dL (ref 31.5–35.7)
MCV: 85 fL (ref 79–97)
Monocytes Absolute: 0.5 10*3/uL (ref 0.1–0.9)
Monocytes: 8 %
Neutrophils Absolute: 3.9 10*3/uL (ref 1.4–7.0)
Neutrophils: 63 %
Platelets: 244 10*3/uL (ref 150–450)
RBC: 4.8 x10E6/uL (ref 3.77–5.28)
RDW: 14.6 % (ref 11.7–15.4)
WBC: 6.1 10*3/uL (ref 3.4–10.8)

## 2023-09-24 LAB — HEMOGLOBIN A1C
Est. average glucose Bld gHb Est-mCnc: 108 mg/dL
Hgb A1c MFr Bld: 5.4 % (ref 4.8–5.6)

## 2023-09-24 LAB — TSH+FREE T4
Free T4: 1.15 ng/dL (ref 0.82–1.77)
TSH: 2.34 u[IU]/mL (ref 0.450–4.500)

## 2023-09-24 LAB — VITAMIN D 25 HYDROXY (VIT D DEFICIENCY, FRACTURES): Vit D, 25-Hydroxy: 16.1 ng/mL — ABNORMAL LOW (ref 30.0–100.0)

## 2023-09-25 ENCOUNTER — Other Ambulatory Visit: Payer: Self-pay | Admitting: Family Medicine

## 2023-09-25 ENCOUNTER — Encounter: Payer: Self-pay | Admitting: Family Medicine

## 2023-09-25 DIAGNOSIS — E559 Vitamin D deficiency, unspecified: Secondary | ICD-10-CM

## 2023-09-25 MED ORDER — VITAMIN D (ERGOCALCIFEROL) 1.25 MG (50000 UNIT) PO CAPS: 50000.0000 [IU] | ORAL_CAPSULE | ORAL | 1 refills | Status: AC

## 2023-09-26 ENCOUNTER — Encounter: Payer: Self-pay | Admitting: Neurology

## 2023-10-02 NOTE — Progress Notes (Unsigned)
 Referring Provider: Gilmore Laroche, FNP Primary Care Physician:  Gilmore Laroche, FNP Primary Gastroenterologist:  Dr. Jena Gauss  Chief Complaint  Patient presents with   Rectal Bleeding    Does have issues with bowels, denies straining.    HPI:   Melanie Russo is a 28 y.o. female presenting today at the request of Gilmore Laroche, FNP for hematochezia.   Patient sent message to Gilmore Laroche, FNP on 2/11 reporting blood in her stool or passing blood only. Labs completed 2/14 with Hgb 13.0.   Reviewed most recent OV with Gilmore Laroche, FNP, 09/22/23. Patient reported new onset neurologic symptoms since her hospitalization in November 2024 where she was diagnosed with IIH and underwent LP and also started on Diamox which resolved her headaches and visual changes, but she was reporting facial numbness, tingling down her left arm, intermittent sour and burning smell sensations.  She has upcoming appointment in March with neurology with [facility], but was requesting referral within the [facility] system.  She also reported intermittent palpitations for the last few months occurring at rest, accompanied by chest tightness, shortness of breath.  EKG in clinic showed normal sinus rhythm.  She was referred to cardiology and neurology.   Appointment with neurology 11/03/2023.  Appointment with cardiology 11/18/2023.  Today:  About twice a month, will feel sick and get really crampy in her lower abdomen, will end up in the bathroom for like an hour with diarrhea type stool. This has been present for about 2 years. Between these episodes, stools are soft and will break up when she flushes the toilet. Tends to have 2 Bms per day. Had some mild cramping related to bowel movements otherwise, but doesn't affect her nearly as much as the flares. Usually bread will cause a flare. In general limits lactose as children are lactose intolerant.   Has taken tylenol as needed. Otherwise nothing for bowels/pain.  Minimal of 4 tylenol per day. Up to 8 per day due to IIH.   When she had her last flare of cramping and diarrhea, she had red blood per rectum with every bowel movement and sometimes only passed blood. Lasted 17 hours. Was having sharp rectal pain as well.  Went to [facility] ER on 2/11 due to rectal bleeding.  She had a rectal exam with no reports of hemorrhoids and heme negative.  No history of rectal bleeding in the past except for when she was pregnant and had hemorrhoids. This was about 4 years ago.   Doesn't know much about family history. No known IBD or colon cancer.    Had GERD when she was pregnant. Doesn't have heartburn now, but gets intermittent LUQ discomfort described as burning that will radiate up to her chest. Started a couple months ago, but is infrequent, less than weekly. Last an hour or so. Resolves on its own. Doesn't take anything. No dysphagia.   Has exertional chest pain and shortness of breath.    Past Medical History:  Diagnosis Date   Anemia    Arthritis    Gestational diabetes    Hypertension    gestational    IIH (idiopathic intracranial hypertension)    PONV (postoperative nausea and vomiting)    Sjogren's syndrome (HCC)     Past Surgical History:  Procedure Laterality Date   CESAREAN SECTION N/A 07/28/2020   Procedure: CESAREAN SECTION;  Surgeon: Lazaro Arms, MD;  Location: [facility] LD [facility]S;  Service: Obstetrics;  Laterality: N/A;   LAPAROSCOPIC BILATERAL SALPINGECTOMY Bilateral 10/08/2020  Procedure: LAPAROSCOPIC BILATERAL SALPINGECTOMY;  Surgeon: Lazaro Arms, MD;  Location: [facility] ORS;  Service: Gynecology;  Laterality: Bilateral;    Current Outpatient Medications  Medication Sig Dispense Refill   Acetaminophen (TYLENOL PO) Take by mouth as needed.     acetaZOLAMIDE (DIAMOX) 250 MG tablet Take 250 mg by mouth 2 (two) times daily.     dicyclomine (BENTYL) 10 MG capsule Take 1 capsule (10 mg total) by mouth every 6 (six) hours as needed (loose stools and  abdominal cramping). 90 capsule 0   hydrocortisone (ANUSOL-HC) 2.5 % rectal cream Place 1 Application rectally 2 (two) times daily. 30 g 1   Vitamin D, Ergocalciferol, (DRISDOL) 1.25 MG (50000 UNIT) CAPS capsule Take 1 capsule (50,000 Units total) by mouth every 7 (seven) days. 20 capsule 1   No current facility-administered medications for this visit.    Allergies as of 10/05/2023 - Review Complete 10/05/2023  Allergen Reaction Noted   Kiwi extract Anaphylaxis and Swelling 02/05/2020   Other Anaphylaxis 02/05/2020   Peanut oil Swelling 07/05/2023   Raspberry Swelling 07/05/2023   Rubus fruticosus Swelling 07/05/2023   Vaccinium angustifolium Swelling 07/05/2023   Latex Hives and Swelling 12/14/2019   Penicillins Hives 12/14/2019   Tree extract  05/11/2022    Family History  Problem Relation Age of Onset   Thyroid disease Paternal Grandfather    Heart disease Paternal Grandmother    Hypertension Paternal Grandmother    Thyroid disease Paternal Grandmother    Cancer Maternal Grandmother        brain   Hypertension Father    Arthritis Father    Autism Son    Obesity Maternal Uncle     Social History   Socioeconomic History   Marital status: Married    Spouse name: Not on file   Number of children: Not on file   Years of education: Not on file   Highest education level: Bachelor's degree (e.g., BA, AB, BS)  Occupational History   Not on file  Tobacco Use   Smoking status: Never   Smokeless tobacco: Never  Vaping Use   Vaping status: Never Used  Substance and Sexual Activity   Alcohol use: Not Currently    Comment: rarely   Drug use: Never   Sexual activity: Yes    Birth control/protection: Surgical    Comment: tubal  Other Topics Concern   Not on file  Social History Narrative   Not on file   Social Drivers of Health   Financial Resource Strain: Low Risk  (09/18/2023)   Overall Financial Resource Strain (CARDIA)    Difficulty of Paying Living Expenses: Not  hard at all  Food Insecurity: No Food Insecurity (09/18/2023)   Hunger Vital Sign    Worried About Running Out of Food in the Last Year: Never true    Ran Out of Food in the Last Year: Never true  Transportation Needs: No Transportation Needs (09/18/2023)   PRAPARE - Administrator, Civil Service (Medical): No    Lack of Transportation (Non-Medical): No  Physical Activity: Insufficiently Active (09/18/2023)   Exercise Vital Sign    Days of Exercise per Week: 2 days    Minutes of Exercise per Session: 30 min  Stress: No Stress Concern Present (09/18/2023)   Melanie Russo [employer] Occupational [employer] - Occupational Stress Questionnaire    Feeling of Stress : Not at all  Social Connections: Unknown (09/18/2023)   Social Connection and Isolation Panel [NHANES]    Frequency  of Communication with Friends and Family: Twice a week    Frequency of Social Gatherings with Friends and Family: Patient declined    Attends Religious Services: Patient declined    Database administrator or Organizations: No    Attends Engineer, structural: Not on file    Marital Status: Married  Catering manager Violence: Not At Risk (09/17/2021)   Humiliation, Afraid, Rape, and Kick questionnaire    Fear of Current or Ex-Partner: No    Emotionally Abused: No    Physically Abused: No    Sexually Abused: No    Review of Systems: Gen: Denies any fever, chills, cold or flulike symptoms. CV: Admits to exertional chest pain. Resp: Admits to exertional shortness of breath. GI: See HPI GU : Denies urinary burning, urinary frequency, urinary hesitancy MS: Denies joint pain. Derm: Denies rash. Psych: Denies depression, anxiety. Heme: See HPI  Physical Exam: BP 122/79 (BP Location: Right Arm, Patient Position: Sitting, Cuff Size: Large)   Pulse 88   Temp 98.5 F (36.9 C) (Oral)   Ht 5\' 6"  (1.676 m)   Wt 258 lb 6.4 oz (117.2 kg)   LMP 09/03/2023   SpO2 98%   BMI 41.71 kg/m  General:   Alert and  oriented. Pleasant and cooperative. Well-nourished and well-developed.  Head:  Normocephalic and atraumatic. Eyes:  Without icterus, sclera clear and conjunctiva pink.  Ears:  Normal auditory acuity. Lungs:  Clear to auscultation bilaterally. No wheezes, rales, or rhonchi. No distress.  Heart:  S1, S2 present without murmurs appreciated.  Abdomen:  +BS, soft, and non-distended.  Mild TTP across lower abdomen.  Patient reports this is chronic.  No HSM noted. No guarding or rebound. No masses appreciated.  Rectal:  Deferred per patient's request Msk:  Symmetrical without gross deformities. Normal posture. Extremities:  Without edema. Neurologic:  Alert and  oriented x4;  grossly normal neurologically. Skin:  Intact without significant lesions or rashes. Psych:  Normal mood and affect.    Assessment:  28 year old female with history of Sjgren's syndrome, IIH, presenting today for further evaluation of rectal bleeding.  Also reporting intermittent abdominal cramping and diarrhea as well as intermittent LUQ burning.  Rectal bleeding: 17-hour span of intermittent rectal bleeding during a flare of abdominal cramping and diarrhea on 2/11.  Also with rectal pain at that time.  Evaluated by [facility] ER with no significant abnormalities.  Rectal exam benign and was heme-negative.  Encouragingly, she has not had any recurrent symptoms and hemoglobin was 13.0 on 2/14.  Differentials include hemorrhoidal bleeding, anal fissure, IBD, diverticular bleed, polyps, and less likely malignancy.  I have recommended a colonoscopy once cleared by cardiology due to exertional chest pain and shortness of breath.  Providing Anusol rectal cream to use for empiric hemorrhoid treatment if recurrent symptoms.    Abdominal cramping/diarrhea: Fairly significant abdominal cramping with diarrhea occurring about twice a month for the last 2 years.  Likely has component of IBS as she also has mild abdominal cramping  related to bowel movements otherwise.  In setting of recent rectal bleeding, unable to rule out IBD.  Could also have celiac disease as she reports flares seem to be related to bread.  She needs a colonoscopy once cleared by cardiology to further evaluate her rectal bleeding.  I will go ahead and screen her for celiac disease and check CRP at this time.  Providing dicyclomine as needed for now.   LUQ abdominal burning: Possibly secondary to GERD/heartburn as  burning will radiate into her chest.  Fairly infrequent.  Recommended using Pepcid AC as needed to see if this provides relief and also to keep a log of when her symptoms occur along with items consumed prior to see if we can identify any particular triggers.    Plan:  Colonoscopy with Dr. Jena Gauss once cleared by cardiology due to exertional chest pain and shortness of breath. She has an appointment with cardiology in April. Encouraged she reach out to see if they have cancellations so she can be seen sooner.  IgA, TTG IgA, CRP Patient will monitor for recurrent rectal bleeding and let me know if this occurs. Anusol rectal cream twice daily x 5-7 days as needed if rectal bleeding returns for possible hemorrhoids. Dicyclomine 10 mg every 6 hours as needed for abdominal cramping/loose stool. Pepcid AC as needed. Requested patient to keep a log of when LUQ abdominal burning occurs along with items eaten prior. Advised to avoid fried, fatty, greasy, spicy foods, caffeine, carbonated beverages. Follow-up in 3 months, but if cleared by cardiology prior to follow-up, patient is to let me know so we can go ahead and arrange colonoscopy.   Ermalinda Memos, PA-C [facility] Gastroenterology 10/05/2023

## 2023-10-05 ENCOUNTER — Other Ambulatory Visit: Payer: Self-pay | Admitting: Gastroenterology

## 2023-10-05 ENCOUNTER — Ambulatory Visit (INDEPENDENT_AMBULATORY_CARE_PROVIDER_SITE_OTHER): Payer: Medicaid Other | Admitting: Gastroenterology

## 2023-10-05 ENCOUNTER — Encounter: Payer: Self-pay | Admitting: Gastroenterology

## 2023-10-05 VITALS — BP 122/79 | HR 88 | Temp 98.5°F | Ht 66.0 in | Wt 258.4 lb

## 2023-10-05 DIAGNOSIS — K625 Hemorrhage of anus and rectum: Secondary | ICD-10-CM

## 2023-10-05 DIAGNOSIS — R109 Unspecified abdominal pain: Secondary | ICD-10-CM

## 2023-10-05 DIAGNOSIS — R1012 Left upper quadrant pain: Secondary | ICD-10-CM | POA: Diagnosis not present

## 2023-10-05 DIAGNOSIS — R197 Diarrhea, unspecified: Secondary | ICD-10-CM

## 2023-10-05 MED ORDER — DICYCLOMINE HCL 10 MG PO CAPS: 10.0000 mg | ORAL_CAPSULE | Freq: Four times a day (QID) | ORAL | 0 refills | Status: AC | PRN

## 2023-10-05 MED ORDER — HYDROCORTISONE (PERIANAL) 2.5 % EX CREA: 1.0000 | TOPICAL_CREAM | Freq: Two times a day (BID) | CUTANEOUS | 1 refills | Status: DC

## 2023-10-05 NOTE — Patient Instructions (Addendum)
 I would like to get you scheduled for a colonoscopy in the near future to further evaluate your recent rectal bleeding.  However, due to exertional chest pain and shortness of breath and your pending appointment with cardiology, I would like for you to see cardiology first for clearance prior to scheduling your procedure.  Please let me know as soon as you are cleared by cardiology.  Please let me know if you have recurrent rectal bleeding.  I am sending in Anusol rectal cream for possible hemorrhoids.  You will apply this twice daily for 5-7 days at a time if your rectal bleeding returns.   I suspect that you may have a component of irritable bowel syndrome contributing to your abdominal cramping and loose stool.  I am sending in dicyclomine 10 mg for you to use as needed for loose stools or abdominal cramping.  Would also like for you to go to [facility] to have blood completed to screen for celiac disease.  I will also check some inflammatory markers.  Your left upper burning that radiates into your chest may be related to acid reflux.  You can try over-the-counter Pepcid AC as needed.  Please keep a log of when the symptoms occur and what you have eaten prior to see if we can find specific dietary triggers.  In general, avoid fried, fatty, greasy, spicy foods.  Avoid caffeine and carbonated beverages.  I will plan to see back in the office in 3 months, but if you are cleared by cardiology before your scheduled follow-up, please let me know so we can go ahead and get your colonoscopy arranged.  Ermalinda Memos, PA-C [facility] Gastroenterology

## 2023-10-09 LAB — C-REACTIVE PROTEIN: CRP: 8 mg/L (ref 0–10)

## 2023-10-09 LAB — IGA: IgA/Immunoglobulin A, Serum: 203 mg/dL (ref 87–352)

## 2023-10-09 LAB — TISSUE TRANSGLUTAMINASE, IGA: Transglutaminase IgA: <2 U/mL (ref 0–3)

## 2023-10-10 ENCOUNTER — Encounter: Payer: Self-pay | Admitting: Gastroenterology

## 2023-10-10 ENCOUNTER — Ambulatory Visit: Payer: BC Managed Care – PPO | Admitting: Family Medicine

## 2023-10-15 ENCOUNTER — Other Ambulatory Visit: Payer: Self-pay | Admitting: Medical Genetics

## 2023-10-17 ENCOUNTER — Other Ambulatory Visit (HOSPITAL_COMMUNITY)

## 2023-10-17 ENCOUNTER — Other Ambulatory Visit (HOSPITAL_COMMUNITY)
Admission: RE | Admit: 2023-10-17 | Discharge: 2023-10-17 | Disposition: A | Discharge status: Home / Self Care | Payer: Self-pay | Source: Ambulatory Visit | Attending: Medical Genetics | Admitting: Medical Genetics

## 2023-10-28 LAB — GENECONNECT MOLECULAR SCREEN: Genetic Analysis Overall Interpretation: NEGATIVE

## 2023-11-03 DIAGNOSIS — G932 Benign intracranial hypertension: Secondary | ICD-10-CM | POA: Diagnosis not present

## 2023-11-03 DIAGNOSIS — Z1331 Encounter for screening for depression: Secondary | ICD-10-CM | POA: Diagnosis not present

## 2023-11-03 DIAGNOSIS — G4440 Drug-induced headache, not elsewhere classified, not intractable: Secondary | ICD-10-CM | POA: Diagnosis not present

## 2023-11-11 DIAGNOSIS — Z6841 Body Mass Index (BMI) 40.0 and over, adult: Secondary | ICD-10-CM | POA: Diagnosis not present

## 2023-11-11 DIAGNOSIS — S90121A Contusion of right lesser toe(s) without damage to nail, initial encounter: Secondary | ICD-10-CM | POA: Diagnosis not present

## 2023-11-11 DIAGNOSIS — R030 Elevated blood-pressure reading, without diagnosis of hypertension: Secondary | ICD-10-CM | POA: Diagnosis not present

## 2023-11-18 ENCOUNTER — Ambulatory Visit: Payer: BLUE CROSS/BLUE SHIELD | Attending: Internal Medicine | Admitting: Internal Medicine

## 2023-11-18 ENCOUNTER — Encounter: Payer: Self-pay | Admitting: Internal Medicine

## 2023-11-18 ENCOUNTER — Telehealth: Payer: Self-pay | Admitting: Internal Medicine

## 2023-11-18 VITALS — BP 118/76 | HR 109 | Ht 66.0 in | Wt 249.0 lb

## 2023-11-18 DIAGNOSIS — R002 Palpitations: Secondary | ICD-10-CM | POA: Diagnosis not present

## 2023-11-18 NOTE — Progress Notes (Signed)
 Cardiology Office Note  Date: 11/18/2023   ID: Melanie Russo, DOB Jul 29, 1996, MRN [id number]  PCP:  Gilmore Laroche, FNP  Cardiologist:  None Electrophysiologist:  None   History of Present Illness: Melanie Russo is a 28 y.o. female known to have idiopathic intracranial hypertension on Diamox was referred to cardiology clinic for evaluation of palpitations.  Ongoing palpitations for few months, occurs couple of times per month, especially at night and lasts for about 5 minutes.  Associated with p.o. spells.  No other symptoms of angina, DOE, dizziness, syncope or leg swelling.  She is short of breath when she works out but nothing with household chores.  She drinks 1 cup of coffee in the morning and tea at night.  She has a family history of arrhythmia in her grandmother.  She does not know the type of arrhythmia.  Past Medical History:  Diagnosis Date   Anemia    Arthritis    Gestational diabetes    Hypertension    gestational    IIH (idiopathic intracranial hypertension)    PONV (postoperative nausea and vomiting)    Sjogren's syndrome (HCC)     Past Surgical History:  Procedure Laterality Date   CESAREAN SECTION N/A 07/28/2020   Procedure: CESAREAN SECTION;  Surgeon: Lazaro Arms, MD;  Location: [facility] LD ORS;  Service: Obstetrics;  Laterality: N/A;   LAPAROSCOPIC BILATERAL SALPINGECTOMY Bilateral 10/08/2020   Procedure: LAPAROSCOPIC BILATERAL SALPINGECTOMY;  Surgeon: Lazaro Arms, MD;  Location: [facility] ORS;  Service: Gynecology;  Laterality: Bilateral;    Current Outpatient Medications  Medication Sig Dispense Refill   Acetaminophen (TYLENOL PO) Take by mouth as needed.     acetaZOLAMIDE (DIAMOX) 250 MG tablet Take 750 mg by mouth 2 (two) times daily.     dicyclomine (BENTYL) 10 MG capsule Take 1 capsule (10 mg total) by mouth every 6 (six) hours as needed (loose stools and abdominal cramping). 90 capsule 0   hydrocortisone (ANUSOL-HC) 2.5 % rectal cream Place 1  Application rectally as needed for hemorrhoids or anal itching.     Multiple Vitamin (MULTIVITAMIN ADULT PO) Take 1 tablet by mouth daily.     Vitamin D, Ergocalciferol, (DRISDOL) 1.25 MG (50000 UNIT) CAPS capsule Take 1 capsule (50,000 Units total) by mouth every 7 (seven) days. 20 capsule 1   No current facility-administered medications for this visit.   Allergies:  Kiwi extract, Other, Peanut oil, Raspberry, Rubus fruticosus, Vaccinium angustifolium, Latex, Penicillins, and Tree extract   Social History: The patient  reports that she has never smoked. She has never used smokeless tobacco. She reports that she does not currently use alcohol. She reports that she does not use drugs.   Family History: The patient's family history includes Arthritis in her father; Autism in her son; Cancer in her maternal grandmother; Heart disease in her paternal grandmother; Hypertension in her father and paternal grandmother; Obesity in her maternal uncle; Thyroid disease in her paternal grandfather and paternal grandmother.   ROS:  Please see the history of present illness. Otherwise, complete review of systems is positive for none  All other systems are reviewed and negative.   Physical Exam: VS:  BP 118/76   Pulse (!) 109   Ht 5\' 6"  (1.676 m)   Wt 249 lb (112.9 kg)   SpO2 99%   BMI 40.19 kg/m , BMI Body mass index is 40.19 kg/m.  Wt Readings from Last 3 Encounters:  11/18/23 249 lb (112.9 kg)  10/05/23 258 lb 6.4  oz (117.2 kg)  09/22/23 254 lb 0.6 oz (115.2 kg)    General: Patient appears comfortable at rest. HEENT: Conjunctiva and lids normal, oropharynx clear with moist mucosa. Neck: Supple, no elevated JVP or carotid bruits, no thyromegaly. Lungs: Clear to auscultation, nonlabored breathing at rest. Cardiac: Regular rate and rhythm, no S3 or significant systolic murmur, no pericardial rub. Abdomen: Soft, nontender, no hepatomegaly, bowel sounds present, no guarding or rebound. Extremities:  No pitting edema, distal pulses 2+. Skin: Warm and dry. Musculoskeletal: No kyphosis. Neuropsychiatric: Alert and oriented x3, affect grossly appropriate.  Recent Labwork: 09/23/2023: ALT 11; AST 14; BUN 8; Creatinine, Ser 0.78; Hemoglobin 13.0; Platelets 244; Potassium 4.0; Sodium 139; TSH 2.340     Component Value Date/Time   CHOL 140 09/23/2023 0834   TRIG 55 09/23/2023 0834   HDL 51 09/23/2023 0834   CHOLHDL 2.7 09/23/2023 0834   LDLCALC 77 09/23/2023 0834     Assessment and Plan:   Palpitations: Ongoing for a few months, occurs at night, last for about 5 minutes and associated with weird smells.  Drinks 1 cup of coffee in the morning and tea at night.  Will obtain 30-day event monitor to rule out any malignant arrhythmias.  If the event monitor is unremarkable, she will need to avoid taking any caffeinated products at night including tea.  Idiopathic intracranial hypertension: On Diamox.  Follows with neurology.      Medication Adjustments/Labs and Tests Ordered: Current medicines are reviewed at length with the patient today.  Concerns regarding medicines are outlined above.    Disposition:  Follow up pending results  Signed Martisha Toulouse Verne Spurr, MD, 11/18/2023 9:03 AM    [facility] at [facility] [address] [place], [place], [place] [address]

## 2023-11-18 NOTE — Telephone Encounter (Signed)
 Checking percert on the following   30 Day Cardiac Monitor ([employer]) dx: palpitations

## 2023-11-18 NOTE — Patient Instructions (Addendum)
 Medication Instructions:  Your physician recommends that you continue on your current medications as directed. Please refer to the Current Medication list given to you today.  Labwork: none  Testing/Procedures: Your physician has recommended that you wear an event monitor for 30 days. Event monitors are medical devices that record the heart's electrical activity. Doctors most often [place] these monitors to diagnose arrhythmias. Arrhythmias are problems with the speed or rhythm of the heartbeat. The monitor is a small, portable device. You can wear one while you do your normal daily activities. This is usually used to diagnose what is causing palpitations/syncope (passing out). [employer] or [employer] will mail this to your home address  Follow-Up: Your physician recommends that you schedule a follow-up appointment in: pending  Any Other Special Instructions Will Be Listed Below (If Applicable).  If you need a refill on your cardiac medications before your next appointment, please call your pharmacy.

## 2023-11-25 ENCOUNTER — Ambulatory Visit: Attending: Internal Medicine

## 2023-11-25 DIAGNOSIS — R002 Palpitations: Secondary | ICD-10-CM

## 2023-11-29 ENCOUNTER — Encounter: Payer: Self-pay | Admitting: Internal Medicine

## 2023-11-29 ENCOUNTER — Ambulatory Visit: Payer: Self-pay

## 2023-11-29 NOTE — Telephone Encounter (Signed)
  Chief Complaint: rash Symptoms: raised, red itchy bumps on upper chest (where heart monitoring device was located) Frequency: x 5 days Pertinent Negatives: Patient denies spreading redness, fever, facial swelling, difficulty breathing Disposition: [] ED /[] Urgent Care (no appt availability in office) / [] Appointment(In office/virtual)/ []  [facility] [facility]/ [x] Home Care/ [] Refused Recommended Disposition /[] [place] [facility]/ []  Follow-up with PCP Additional Notes: Patient states she has been wearing a heart monitoring device since Friday. She states the itching became so severe she had to take the monitor off. She states where the adhesive was located there are red, raised bumps. She states the itchiness has subsided and there is no pain. Advised patient to clean the area where the adhesive was with mild soap and water , apply a cold washcloth and if needed to take a Benadryl . Patient verbalizes understanding to call her cardiologist and inform them she had to remove the monitor early and to call us  back for any new or worsening symptoms.  Copied from CRM [id number]. Topic: Clinical - Red Word Triage >> Nov 29, 2023 10:33 AM Rennis Case wrote: Reason for CRM: Patient given event monitor by cardiologist. Pt states she was given the [employer] MINI  Slide Strip, patient states she may have had a reaction to the strips. Patient is allergic to latex, but the box says that does not have latex.   Patient states wherever she put the strips her skin is raised and and it was burning for about 3 hours after she took them off. Reason for Disposition  Mild localized rash  Answer Assessment - Initial Assessment Questions 1. APPEARANCE of RASH: "Describe the rash."      Red, raised 2. LOCATION: "Where is the rash located?"      Upper chest "near breast bone".  3. NUMBER: "How many spots are there?"      She states its hard to count the number, but altogether she said it makes out the size of the  cardiac device she was wearing.  4. SIZE: "How big are the spots?" (Inches, centimeters or compare to size of a coin)      Some are bigger, some are smaller. The smallest is the size of a fingernail clipping and the largest is the size of a quarter.  5. ONSET: "When did the rash start?"      X 5 days.  6. ITCHING: "Does the rash itch?" If Yes, ask: "How bad is the itch?"  (Scale 0-10; or none, mild, moderate, severe)     Yes. She states the itching was severe and she eventually took it off.  7. PAIN: "Does the rash hurt?" If Yes, ask: "How bad is the pain?"  (Scale 0-10; or none, mild, moderate, severe)    - NONE (0): no pain    - MILD (1-3): doesn't interfere with normal activities     - MODERATE (4-7): interferes with normal activities or awakens from sleep     - SEVERE (8-10): excruciating pain, unable to do any normal activities     Denies.  8. OTHER SYMPTOMS: "Do you have any other symptoms?" (e.g., fever)     Denies.  9. PREGNANCY: "Is there any chance you are pregnant?" "When was your last menstrual period?"     LMP: finished yesterday.  Protocols used: Rash or Redness - Localized-A-AH

## 2023-12-14 ENCOUNTER — Telehealth: Payer: Self-pay | Admitting: Internal Medicine

## 2023-12-14 NOTE — Telephone Encounter (Signed)
 Patient has had several allergic reactions while trying to wear the event monitor. Has already reached out to the company to have several different patches sent to her and the current pediatric ones, but they are too small to hold the monitor in place they keep falling off. Patient has very sensitive skin. She stated that out of the time she has had the monitor she may have worn it a full 2 weeks. She had to remove to let skin heal from the itching and redness. She did report that while she had monitor off did have an episode of palpitations. Please advise if you want patient to send back now and to see what information we have.

## 2023-12-14 NOTE — Telephone Encounter (Signed)
  1. Is this related to a heart monitor you are wearing?  (If the patient says no, please ask     if they are caling about ICD/pacemaker.) Yes  2. What is your issue?? Pt received pediatric patches due to previous issues w/ rash, they will only stay on for a few hours then come off, requesting cb   Please route to covering RN/CMA/RMA for results. Route to monitor technicians or your monitor tech representative for your site for any technical concerns

## 2023-12-15 ENCOUNTER — Encounter: Payer: Self-pay | Admitting: Family Medicine

## 2023-12-16 ENCOUNTER — Ambulatory Visit: Payer: Self-pay

## 2023-12-16 NOTE — Telephone Encounter (Signed)
  Chief Complaint: numbness/tingling Symptoms: both sides of face and right hand numbness and tingling, headache, pain behind eyes, change in smell, sensation of heart racing, lightheaded Frequency: x 18 hours and resolved now; asymptomatic at present Pertinent Negatives: Patient denies chest pain, SOB at present, changes in speech,  Disposition: [] ED /[] Urgent Care (no appt availability in office) / [x] Appointment(In office/virtual)/ []  [facility]/ [] Home Care/ [x] Refused Recommended Disposition /[] [facility]/ []  Follow-up with PCP Additional Notes: Patient is asymptomatic at this time. Patient states she has been having episodes like this for a while now but she called in as the episode yesterday lasted longer than usual/baseline. Offered soonest available appointment on Tuesday with available provider.  Patient asking if she can come in on Thursday instead.  Copied from CRM [id number]. Topic: Clinical - Red Word Triage >> Dec 16, 2023  3:54 PM Turkey B wrote: [facility] that prompted transfer to Nurse Triage: pt had numbness from her cheeks to chin for 9hours last night Reason for Disposition  [1] Numbness or tingling in one or both hands AND [2] is a chronic symptom (recurrent or ongoing AND present > 4 weeks)  Answer Assessment - Initial Assessment Questions 1. SYMPTOM: "What is the main symptom you are concerned about?" (e.g., weakness, numbness)     Numbness/tingling.  2. ONSET: "When did this start?" (minutes, hours, days; while sleeping)     Noon yesterday and resolved this morning when she woke up around 0630.  3. LAST NORMAL: "When was the last time you (the patient) were normal (no symptoms)?"     Currently asymptomatic.  4. PATTERN "Does this come and go, or has it been constant since it started?"  "Is it present now?"     Comes and goes, not present now.  5. CARDIAC SYMPTOMS: "Have you had any of the following symptoms: chest pain, difficulty breathing,  palpitations?"     Felt like she couldn't catch her breath, a couple of hours. Felt like her heart rate was racing.  6. NEUROLOGIC SYMPTOMS: "Have you had any of the following symptoms: headache, dizziness, vision loss, double vision, changes in speech, unsteady on your feet?"     Headache/pain behind eyes, change in sense of smell, floaters in eyes, lightheaded (positional if bending down and standing back up).  7. OTHER SYMPTOMS: "Do you have any other symptoms?"     Anxiety.  8. PREGNANCY: "Is there any chance you are pregnant?" "When was your last menstrual period?"     LMP: 2 weeks ago.  Protocols used: Neurologic Deficit-A-AH

## 2023-12-16 NOTE — Telephone Encounter (Signed)
 Per Dr. Mallipeddi response:  Spoke with patient advised of recommendations and patient stated that purchasing a smart watch is not feasible for her at this time. Are there any other recommendations?  Please advise

## 2023-12-19 MED ORDER — DILTIAZEM HCL 30 MG PO TABS: 30.0000 mg | ORAL_TABLET | Freq: Three times a day (TID) | ORAL | 2 refills | Status: DC | PRN

## 2023-12-19 MED ORDER — DILTIAZEM HCL 30 MG PO TABS: 30.0000 mg | ORAL_TABLET | Freq: Three times a day (TID) | ORAL | 2 refills | Status: AC | PRN

## 2023-12-19 NOTE — Addendum Note (Signed)
 Addended by: Camilo Cella on: 12/19/2023 04:42 PM   Modules accepted: Orders

## 2023-12-19 NOTE — Telephone Encounter (Signed)
 Appt scheduled

## 2023-12-19 NOTE — Telephone Encounter (Signed)
 Per Dr. Mallipeddi:  Can take diltiazem 30 mg Q8h PRN for palpitations. Hold for BP<100 mm Hg SBP.   Advised patient of these recommendations. She agreed and sent into [facility]. Patient verbalized understanding.

## 2023-12-19 NOTE — Addendum Note (Signed)
 Addended by: Camilo Cella on: 12/19/2023 04:40 PM   Modules accepted: Orders

## 2023-12-22 ENCOUNTER — Ambulatory Visit (INDEPENDENT_AMBULATORY_CARE_PROVIDER_SITE_OTHER)

## 2023-12-22 VITALS — BP 111/76 | HR 90 | Ht 66.0 in | Wt 242.0 lb

## 2023-12-22 DIAGNOSIS — R200 Anesthesia of skin: Secondary | ICD-10-CM | POA: Diagnosis not present

## 2023-12-22 NOTE — Progress Notes (Addendum)
 Acute Office Visit  Subjective:     Patient ID: Melanie Russo, female    DOB: 1995-08-19, 28 y.o.   MRN: [id number]  Chief Complaint  Patient presents with   Medical Management of Chronic Issues    Pt states "Numbess from top of cheeks to chin x 9 hrs, Not numb now "    HPI Patient is in today for facial numbness  Review of Systems  Constitutional: Negative.   HENT: Negative.    Eyes: Negative.   Respiratory: Negative.    Cardiovascular: Negative.   Gastrointestinal: Negative.   Genitourinary: Negative.   Musculoskeletal: Negative.   Skin: Negative.   Neurological:  Positive for tingling (face).  Psychiatric/Behavioral: Negative.          Objective:    BP 111/76   Pulse 90   Ht 5\' 6"  (1.676 m)   Wt 242 lb (109.8 kg)   SpO2 96%   BMI 39.06 kg/m    Physical Exam Vitals and nursing note reviewed.  Constitutional:      Appearance: Normal appearance.  HENT:     Head: Normocephalic.     Right Ear: Tympanic membrane, ear canal and external ear normal.     Left Ear: Tympanic membrane, ear canal and external ear normal.     Nose: Nose normal.     Mouth/Throat:     Mouth: Mucous membranes are moist.     Pharynx: Oropharynx is clear.  Eyes:     Extraocular Movements: Extraocular movements intact.     Pupils: Pupils are equal, round, and reactive to light.  Cardiovascular:     Rate and Rhythm: Normal rate and regular rhythm.  Pulmonary:     Effort: Pulmonary effort is normal.     Breath sounds: Normal breath sounds.  Musculoskeletal:     Cervical back: Normal range of motion and neck supple.  Skin:    General: Skin is warm and dry.  Neurological:     Mental Status: She is alert and oriented to person, place, and time.  Psychiatric:        Mood and Affect: Mood normal.        Thought Content: Thought content normal.     Results for orders placed or performed in visit on 12/22/23  B12 and Folate Panel  Result Value Ref Range   Vitamin B-12 477 232 -  1,245 pg/mL   Folate 6.9 >3.0 ng/mL  CBC  Result Value Ref Range   WBC 7.4 3.4 - 10.8 x10E3/uL   RBC 5.09 3.77 - 5.28 x10E6/uL   Hemoglobin 13.8 11.1 - 15.9 g/dL   Hematocrit 40.3 47.4 - 46.6 %   MCV 87 79 - 97 fL   MCH 27.1 26.6 - 33.0 pg   MCHC 31.3 (L) 31.5 - 35.7 g/dL   RDW 25.9 56.3 - 87.5 %   Platelets 278 150 - 450 x10E3/uL  Fe+TIBC+Fer  Result Value Ref Range   Total Iron Binding Capacity 381 250 - 450 ug/dL   UIBC 643 329 - 518 ug/dL   Iron 66 27 - 841 ug/dL   Iron Saturation 17 15 - 55 %   Ferritin 41 15 - 150 ng/mL        Assessment & Plan:   Problem List Items Addressed This Visit   None Visit Diagnoses       Numbness of face    -  Primary   Unremarkable exam. Patient is neurologically intact.  Will check labs for further  evaluation.   Relevant Orders   B12 and Folate Panel (Completed)   CBC (Completed)   Fe+TIBC+Fer (Completed)       No orders of the defined types were placed in this encounter.   No follow-ups on file.  Alison Irvine, FNP

## 2023-12-23 LAB — CBC
Hematocrit: 44.1 % (ref 34.0–46.6)
Hemoglobin: 13.8 g/dL (ref 11.1–15.9)
MCH: 27.1 pg (ref 26.6–33.0)
MCHC: 31.3 g/dL — ABNORMAL LOW (ref 31.5–35.7)
MCV: 87 fL (ref 79–97)
Platelets: 278 10*3/uL (ref 150–450)
RBC: 5.09 x10E6/uL (ref 3.77–5.28)
RDW: 14.4 % (ref 11.7–15.4)
WBC: 7.4 10*3/uL (ref 3.4–10.8)

## 2023-12-23 LAB — IRON,TIBC AND FERRITIN PANEL
Ferritin: 41 ng/mL (ref 15–150)
Iron Saturation: 17 % (ref 15–55)
Iron: 66 ug/dL (ref 27–159)
Total Iron Binding Capacity: 381 ug/dL (ref 250–450)
UIBC: 315 ug/dL (ref 131–425)

## 2023-12-23 LAB — B12 AND FOLATE PANEL
Folate: 6.9 ng/mL (ref >3.0)
Vitamin B-12: 477 pg/mL (ref 232–1245)

## 2023-12-27 ENCOUNTER — Ambulatory Visit: Payer: Self-pay

## 2023-12-30 ENCOUNTER — Ambulatory Visit: Payer: BLUE CROSS/BLUE SHIELD | Admitting: Gastroenterology

## 2024-01-11 DIAGNOSIS — K029 Dental caries, unspecified: Secondary | ICD-10-CM | POA: Diagnosis not present

## 2024-01-11 DIAGNOSIS — Z91040 Latex allergy status: Secondary | ICD-10-CM | POA: Diagnosis not present

## 2024-01-11 DIAGNOSIS — Z880 Allergy status to penicillin: Secondary | ICD-10-CM | POA: Diagnosis not present

## 2024-01-11 DIAGNOSIS — Z79899 Other long term (current) drug therapy: Secondary | ICD-10-CM | POA: Diagnosis not present

## 2024-01-11 DIAGNOSIS — K047 Periapical abscess without sinus: Secondary | ICD-10-CM | POA: Diagnosis not present

## 2024-01-11 DIAGNOSIS — K0889 Other specified disorders of teeth and supporting structures: Secondary | ICD-10-CM | POA: Diagnosis not present

## 2024-01-23 ENCOUNTER — Ambulatory Visit: Payer: BLUE CROSS/BLUE SHIELD | Admitting: Family Medicine

## 2024-01-30 ENCOUNTER — Ambulatory Visit: Payer: BLUE CROSS/BLUE SHIELD | Admitting: Neurology

## 2024-03-05 DIAGNOSIS — G932 Benign intracranial hypertension: Secondary | ICD-10-CM | POA: Diagnosis not present

## 2024-03-12 ENCOUNTER — Ambulatory Visit (INDEPENDENT_AMBULATORY_CARE_PROVIDER_SITE_OTHER): Admitting: Family Medicine

## 2024-03-12 ENCOUNTER — Encounter: Payer: Self-pay | Admitting: Family Medicine

## 2024-03-12 VITALS — BP 135/85 | HR 105 | Resp 16 | Ht 66.0 in | Wt 237.0 lb

## 2024-03-12 DIAGNOSIS — R233 Spontaneous ecchymoses: Secondary | ICD-10-CM | POA: Diagnosis not present

## 2024-03-12 DIAGNOSIS — E038 Other specified hypothyroidism: Secondary | ICD-10-CM | POA: Diagnosis not present

## 2024-03-12 DIAGNOSIS — R7301 Impaired fasting glucose: Secondary | ICD-10-CM | POA: Diagnosis not present

## 2024-03-12 DIAGNOSIS — E559 Vitamin D deficiency, unspecified: Secondary | ICD-10-CM

## 2024-03-12 DIAGNOSIS — E7849 Other hyperlipidemia: Secondary | ICD-10-CM

## 2024-03-12 NOTE — Patient Instructions (Addendum)
 I appreciate the opportunity to provide care to you today!    Follow up:  5 months  Labs: please stop by the lab  during the week to get your blood drawn (CBC, CMP, TSH, Lipid profile, HgA1c, Vit D)  For a Healthier YOU, I Recommend: Reducing your intake of sugar, sodium, carbohydrates, and saturated fats. Increasing your fiber intake by incorporating more whole grains, fruits, and vegetables into your meals. Setting healthy goals with a focus on lowering your consumption of carbs, sugar, and unhealthy fats. Adding variety to your diet by including a wide range of fruits and vegetables. Cutting back on soda and limiting processed foods as much as possible. Staying active: In addition to taking your weight loss medication, aim for at least 150 minutes of moderate-intensity physical activity each week for optimal results.   Fatigue - B12 1000mcg daily  Here are evidence-based nonpharmacological interventions for fatigue, especially relevant for patients with chronic conditions, cancer-related fatigue, or general fatigue:  1. Physical Activity Regular, moderate exercise (e.g., walking, stretching, yoga) Shown to reduce fatigue and improve energy levels over time Tailored exercise programs (e.g., physical therapy) may be beneficial for deconditioned patients   2. Sleep Hygiene Maintain a consistent sleep schedule Avoid caffeine  or electronics before bedtime Create a quiet, dark, and comfortable sleeping environment  3. Energy Conservation Techniques Prioritize tasks and pace activities throughout the day Alternate periods of activity with rest Use assistive devices when needed (e.g., walkers, shower chairs)  4. Nutritional Support Eat small, frequent, balanced meals throughout the day Stay hydrated Address any underlying nutrient deficiencies (e.g., iron, B12)  5. Stress Management Use relaxation techniques: deep breathing, meditation, or guided imagery Consider counseling or  cognitive behavioral therapy (CBT) if emotional distress is contributing to fatigue    Please follow up if your symptoms worsen or fail to improve.   Attached with your AVS, you will find valuable resources for self-education. I highly recommend dedicating some time to thoroughly examine them.   Please continue to a heart-healthy diet and increase your physical activities. Try to exercise for at least five days a week.    It was a pleasure to see you and I look forward to continuing to work together on your health and well-being. Please do not hesitate to call the office if you need care or have questions about your care.  In case of emergency, please visit the Emergency Department for urgent care, or contact our clinic at [phone] to schedule an appointment. We're here to help you!   Have a wonderful day and week. With Gratitude, Candia Kingsbury MSN, FNP-BC

## 2024-03-12 NOTE — Progress Notes (Unsigned)
 Established Patient Office Visit  Subjective:  Patient ID: Melanie Russo, female    DOB: 1995-10-02  Age: 28 y.o. MRN: [id number]  CC:  Chief Complaint  Patient presents with   Follow-up    HPI Melanie Russo is a 28 y.o. female with past medical history of *** presents for f/u of *** chronic medical conditions.  Platee, a few months  Past Medical History:  Diagnosis Date   Anemia    Arthritis    Gestational diabetes    Hypertension    gestational    IIH (idiopathic intracranial hypertension)    PONV (postoperative nausea and vomiting)    Sjogren's syndrome (HCC)     Past Surgical History:  Procedure Laterality Date   CESAREAN SECTION N/A 07/28/2020   Procedure: CESAREAN SECTION;  Surgeon: Jayne Vonn DEL, MD;  Location: [facility] ORS;  Service: Obstetrics;  Laterality: N/A;   LAPAROSCOPIC BILATERAL SALPINGECTOMY Bilateral 10/08/2020   Procedure: LAPAROSCOPIC BILATERAL SALPINGECTOMY;  Surgeon: Jayne Vonn DEL, MD;  Location: [facility]S;  Service: Gynecology;  Laterality: Bilateral;    Family History  Problem Relation Age of Onset   Thyroid  disease Paternal Grandfather    Heart disease Paternal Grandmother    Hypertension Paternal Grandmother    Thyroid  disease Paternal Grandmother    Cancer Maternal Grandmother        brain   Hypertension Father    Arthritis Father    Autism Son    Obesity Maternal Uncle     Social History   Socioeconomic History   Marital status: Married    Spouse name: Not on file   Number of children: Not on file   Years of education: Not on file   Highest education level: Bachelor's degree (e.g., BA, AB, BS)  Occupational History   Not on file  Tobacco Use   Smoking status: Never   Smokeless tobacco: Never  Vaping Use   Vaping status: Never Used  Substance and Sexual Activity   Alcohol use: Not Currently    Comment: rarely   Drug use: Never   Sexual activity: Yes    Birth control/protection: Surgical    Comment: tubal  Other  Topics Concern   Not on file  Social History Narrative   Not on file   Social Drivers of Health   Financial Resource Strain: Low Risk  (03/02/2024)   Received from [facility]   Overall Financial Resource Strain (CARDIA)    How hard is it for you to pay for the very basics like food, housing, medical care, and heating?: Not hard at all  Food Insecurity: No Food Insecurity (03/02/2024)   Received from [facility]   Hunger Vital Sign    Within the past 12 months, you worried that your food would run out before you got the money to buy more.: Never true    Within the past 12 months, the food you bought just didn't last and you didn't have money to get more.: Never true  Transportation Needs: No Transportation Needs (03/02/2024)   Received from [facility] - Transportation    In the past 12 months, has lack of transportation kept you from medical appointments or from getting medications?: No    In the past 12 months, has lack of transportation kept you from meetings, work, or from getting things needed for daily living?: No  Physical Activity: Insufficiently Active (03/02/2024)   Received from [facility]   Exercise Vital Sign    On average,  how many days per week do you engage in moderate to strenuous exercise (like a brisk walk)?: 2 days    On average, how many minutes do you engage in exercise at this level?: 20 min  Stress: No Stress Concern Present (03/02/2024)   Received from [facility] - Occupational Stress Questionnaire    Do you feel stress - tense, restless, nervous, or anxious, or unable to sleep at night because your mind is troubled all the time - these days?: Not at all  Social Connections: Socially Integrated (03/02/2024)   Received from [facility]   Social Network    How would you rate your social network (family, work, friends)?: Good participation with social networks  Intimate Partner Violence: Not At Risk  (03/02/2024)   Received from [facility]   HITS    Over the last 12 months how often did your partner physically hurt you?: Never    Over the last 12 months how often did your partner insult you or talk down to you?: Never    Over the last 12 months how often did your partner threaten you with physical harm?: Never    Over the last 12 months how often did your partner scream or curse at you?: Never    Outpatient Medications Prior to Visit  Medication Sig Dispense Refill   Acetaminophen  (TYLENOL  PO) Take by mouth as needed.     dicyclomine  (BENTYL ) 10 MG capsule Take 1 capsule (10 mg total) by mouth every 6 (six) hours as needed (loose stools and abdominal cramping). 90 capsule 0   diltiazem  (CARDIZEM ) 30 MG tablet Take 1 tablet (30 mg total) by mouth every 8 (eight) hours as needed (Hold for BP<100 mm Hg SBP.). 60 tablet 2   hydrocortisone  (ANUSOL -HC) 2.5 % rectal cream Place 1 Application rectally as needed for hemorrhoids or anal itching.     Multiple Vitamin (MULTIVITAMIN ADULT PO) Take 1 tablet by mouth daily.     topiramate (TOPAMAX) 25 MG tablet Take 75 mg by mouth at bedtime.     Vitamin D , Ergocalciferol , (DRISDOL ) 1.25 MG (50000 UNIT) CAPS capsule Take 1 capsule (50,000 Units total) by mouth every 7 (seven) days. 20 capsule 1   acetaZOLAMIDE (DIAMOX) 250 MG tablet Take 750 mg by mouth 2 (two) times daily. (Patient not taking: Reported on 03/12/2024)     No facility-administered medications prior to visit.    Allergies  Allergen Reactions   Kiwi Extract Anaphylaxis and Swelling    Tightness in the throat   Other Anaphylaxis    Blueberries, blackberries, raspberries, nuts- pt reports that it feels like she is being stabbed in the mouth when eating nuts    Peanut Oil Swelling    Tightness in the throat   Raspberry Swelling    Tightness in the throat   Rubus Fruticosus Swelling    Tightness in the throat   Vaccinium Angustifolium Swelling    Tightness in the throat   Latex  Hives and Swelling    Burning   Penicillins Hives    Reaction: 2016   Tree Extract     ROS Review of Systems    Objective:    Physical Exam  BP 135/85   Pulse (!) 105   Resp 16   Ht 5' 6 (1.676 m)   Wt 237 lb (107.5 kg)   SpO2 94%   BMI 38.25 kg/m  Wt Readings from Last 3 Encounters:  03/12/24 237 lb (  107.5 kg)  12/22/23 242 lb (109.8 kg)  11/18/23 249 lb (112.9 kg)    Lab Results  Component Value Date   TSH 2.340 09/23/2023   Lab Results  Component Value Date   WBC 7.4 12/22/2023   HGB 13.8 12/22/2023   HCT 44.1 12/22/2023   MCV 87 12/22/2023   PLT 278 12/22/2023   Lab Results  Component Value Date   NA 139 09/23/2023   K 4.0 09/23/2023   CO2 17 (L) 09/23/2023   GLUCOSE 84 09/23/2023   BUN 8 09/23/2023   CREATININE 0.78 09/23/2023   BILITOT 0.5 09/23/2023   ALKPHOS 86 09/23/2023   AST 14 09/23/2023   ALT 11 09/23/2023   PROT 7.0 09/23/2023   ALBUMIN 4.5 09/23/2023   CALCIUM 8.9 09/23/2023   ANIONGAP 10 10/06/2020   EGFR 106 09/23/2023   Lab Results  Component Value Date   CHOL 140 09/23/2023   Lab Results  Component Value Date   HDL 51 09/23/2023   Lab Results  Component Value Date   LDLCALC 77 09/23/2023   Lab Results  Component Value Date   TRIG 55 09/23/2023   Lab Results  Component Value Date   CHOLHDL 2.7 09/23/2023   Lab Results  Component Value Date   HGBA1C 5.4 09/23/2023      Assessment & Plan:  There are no diagnoses linked to this encounter.  Follow-up: No follow-ups on file.   Demetric Parslow, FNP

## 2024-03-13 DIAGNOSIS — R233 Spontaneous ecchymoses: Secondary | ICD-10-CM | POA: Insufficient documentation

## 2024-03-13 NOTE — Assessment & Plan Note (Addendum)
 Pending CBC with platelet count to assess for possible thrombocytopenia or other hematologic abnormalities. Pending CMP to evaluate for liver function or other metabolic causes of easy bruising and fatigue. PENDING PT/INR and to assess clotting function Encourage adequate hydration, rest, and a balanced diet to support energy levels. Advise patient to monitor and document any new bruises, bleeding episodes, or worsening fatigue.

## 2024-03-26 ENCOUNTER — Encounter: Payer: Self-pay | Admitting: Family Medicine

## 2024-04-17 ENCOUNTER — Encounter: Payer: Self-pay | Admitting: Family Medicine

## 2024-07-19 ENCOUNTER — Encounter (INDEPENDENT_AMBULATORY_CARE_PROVIDER_SITE_OTHER): Payer: Self-pay

## 2024-08-13 ENCOUNTER — Ambulatory Visit: Admitting: Family Medicine

## 2024-09-06 ENCOUNTER — Encounter: Payer: Self-pay | Admitting: Family Medicine

## 2024-09-06 ENCOUNTER — Telehealth: Admitting: Family Medicine

## 2024-09-06 DIAGNOSIS — G932 Benign intracranial hypertension: Secondary | ICD-10-CM

## 2024-09-06 NOTE — Progress Notes (Signed)
 "  Virtual Visit via Video Note  I connected with Melanie Russo on 09/07/24 at  2:00 PM EST by a video enabled telemedicine application and verified that I am speaking with the correct person using two identifiers.  Patient Location: Home Provider Location: Home Office  I discussed the limitations, risks, security, and privacy concerns of performing an evaluation and management service by video and the availability of in person appointments. I also discussed with the patient that there may be a patient responsible charge related to this service. The patient expressed understanding and agreed to proceed.  Subjective: PCP: Edman Meade PEDLAR, FNP  Chief Complaint  Patient presents with   Medical Management of Chronic Issues    Five month follow up    Referral    Referral to neurology    HPI  The patient presents today for a referral to neurology for ongoing management of idiopathic intracranial hypertension (IIH). She is also currently under the care of ophthalmology.  The patient reports previously seeing a neurologist at [facility], but her most recent appointment was canceled. She is currently taking Topamax as prescribed by her neurologist for IIH. She notes experiencing floaters, primarily in her left eye, and increased swelling in the same eye. Her ophthalmologist has encouraged her to follow up with neurology regarding these symptoms.  She denies eye pain or significant redness. She reports mild light sensitivity, mostly during headaches, and occasional flashes of light. She is being managed collaboratively by both her neurologist and ophthalmologist/optometrist. Her last ophthalmology appointment was in July, and a December appointment was canceled.  ROS: Per HPI Current Medications[1]  Observations/Objective: There were no vitals filed for this visit. Physical Exam Patient is well-developed, well-nourished in no acute distress.  Resting comfortably at home.  Head is  normocephalic, atraumatic.  No labored breathing.  Speech is clear and coherent with logical content.  Patient is alert and oriented at baseline.   Assessment and Plan: Idiopathic intracranial hypertension Assessment & Plan: Referral to neurology for ongoing management of idiopathic intracranial hypertension. Continue Topamax as prescribed; monitor for side effects and report any worsening visual changes. Continue ophthalmology follow-up for eye findings including floaters, swelling, and visual changes. Monitor for warning signs requiring urgent evaluation, including sudden vision loss, worsening headache, eye pain, or new neurological symptoms. Reinforce adherence to current treatment regimen and encourage tracking of headache frequency, visual changes, and medication side effects. Schedule follow-up with neurology and ophthalmology as soon as possible to ensure coordinated care.   Orders: -     Ambulatory referral to Neurology  Note: This chart has been completed using Dragon Medical Dictation software, and while attempts have been made to ensure accuracy, certain words and phrases may not be transcribed as intended.    Follow Up Instructions: No follow-ups on file.   I discussed the assessment and treatment plan with the patient. The patient was provided an opportunity to ask questions, and all were answered. The patient agreed with the plan and demonstrated an understanding of the instructions.   The patient was advised to call back or seek an in-person evaluation if the symptoms worsen or if the condition fails to improve as anticipated.  The above assessment and management plan was discussed with the patient. The patient verbalized understanding of and has agreed to the management plan.   Chidinma Clites  Z Bacchus, FNP     [1]  Current Outpatient Medications:    Acetaminophen  (TYLENOL  PO), Take by mouth as needed., Disp: , Rfl:  acetaZOLAMIDE (DIAMOX) 250 MG tablet, Take 750 mg by  mouth 2 (two) times daily. (Patient not taking: Reported on 03/12/2024), Disp: , Rfl:    dicyclomine  (BENTYL ) 10 MG capsule, Take 1 capsule (10 mg total) by mouth every 6 (six) hours as needed (loose stools and abdominal cramping)., Disp: 90 capsule, Rfl: 0   diltiazem  (CARDIZEM ) 30 MG tablet, Take 1 tablet (30 mg total) by mouth every 8 (eight) hours as needed (Hold for BP<100 mm Hg SBP.)., Disp: 60 tablet, Rfl: 2   hydrocortisone  (ANUSOL -HC) 2.5 % rectal cream, Place 1 Application rectally as needed for hemorrhoids or anal itching., Disp: , Rfl:    Multiple Vitamin (MULTIVITAMIN ADULT PO), Take 1 tablet by mouth daily., Disp: , Rfl:    topiramate (TOPAMAX) 25 MG tablet, Take 75 mg by mouth at bedtime., Disp: , Rfl:    Vitamin D , Ergocalciferol , (DRISDOL ) 1.25 MG (50000 UNIT) CAPS capsule, Take 1 capsule (50,000 Units total) by mouth every 7 (seven) days., Disp: 20 capsule, Rfl: 1  "

## 2024-09-07 NOTE — Assessment & Plan Note (Signed)
 Referral to neurology for ongoing management of idiopathic intracranial hypertension. Continue Topamax as prescribed; monitor for side effects and report any worsening visual changes. Continue ophthalmology follow-up for eye findings including floaters, swelling, and visual changes. Monitor for warning signs requiring urgent evaluation, including sudden vision loss, worsening headache, eye pain, or new neurological symptoms. Reinforce adherence to current treatment regimen and encourage tracking of headache frequency, visual changes, and medication side effects. Schedule follow-up with neurology and ophthalmology as soon as possible to ensure coordinated care.

## 2024-09-18 ENCOUNTER — Ambulatory Visit: Admitting: Nurse Practitioner
